# Patient Record
Sex: Female | Born: 1985 | Race: White | Hispanic: No | Marital: Single | Smoking: Current every day smoker
Health system: Southern US, Community
[De-identification: ages and names within clinical notes are randomized; demographics above are authoritative.]

## PROBLEM LIST (undated history)

## (undated) ENCOUNTER — Inpatient Hospital Stay (HOSPITAL_COMMUNITY): Payer: Self-pay

## (undated) DIAGNOSIS — F319 Bipolar disorder, unspecified: Secondary | ICD-10-CM

## (undated) DIAGNOSIS — F329 Major depressive disorder, single episode, unspecified: Secondary | ICD-10-CM

## (undated) DIAGNOSIS — B009 Herpesviral infection, unspecified: Secondary | ICD-10-CM

## (undated) DIAGNOSIS — F32A Depression, unspecified: Secondary | ICD-10-CM

## (undated) DIAGNOSIS — F1910 Other psychoactive substance abuse, uncomplicated: Secondary | ICD-10-CM

## (undated) DIAGNOSIS — F419 Anxiety disorder, unspecified: Secondary | ICD-10-CM

## (undated) HISTORY — PX: WISDOM TOOTH EXTRACTION: SHX21

## (undated) HISTORY — PX: NO PAST SURGERIES: SHX2092

---

## 2017-10-20 ENCOUNTER — Inpatient Hospital Stay (HOSPITAL_BASED_OUTPATIENT_CLINIC_OR_DEPARTMENT_OTHER)
Admission: EM | Admit: 2017-10-20 | Discharge: 2017-10-20 | Disposition: A | Discharge status: Home / Self Care | Payer: Medicaid Other | Attending: Obstetrics & Gynecology | Admitting: Obstetrics & Gynecology

## 2017-10-20 ENCOUNTER — Other Ambulatory Visit: Payer: Self-pay

## 2017-10-20 ENCOUNTER — Encounter (HOSPITAL_BASED_OUTPATIENT_CLINIC_OR_DEPARTMENT_OTHER): Payer: Self-pay | Admitting: *Deleted

## 2017-10-20 DIAGNOSIS — F17210 Nicotine dependence, cigarettes, uncomplicated: Secondary | ICD-10-CM | POA: Diagnosis not present

## 2017-10-20 DIAGNOSIS — O9989 Other specified diseases and conditions complicating pregnancy, childbirth and the puerperium: Secondary | ICD-10-CM

## 2017-10-20 DIAGNOSIS — Z3A26 26 weeks gestation of pregnancy: Secondary | ICD-10-CM | POA: Diagnosis not present

## 2017-10-20 DIAGNOSIS — F1123 Opioid dependence with withdrawal: Secondary | ICD-10-CM | POA: Diagnosis not present

## 2017-10-20 DIAGNOSIS — O26892 Other specified pregnancy related conditions, second trimester: Secondary | ICD-10-CM | POA: Diagnosis present

## 2017-10-20 DIAGNOSIS — F1193 Opioid use, unspecified with withdrawal: Secondary | ICD-10-CM

## 2017-10-20 DIAGNOSIS — F1910 Other psychoactive substance abuse, uncomplicated: Secondary | ICD-10-CM | POA: Insufficient documentation

## 2017-10-20 DIAGNOSIS — O99322 Drug use complicating pregnancy, second trimester: Secondary | ICD-10-CM | POA: Diagnosis not present

## 2017-10-20 DIAGNOSIS — O99332 Smoking (tobacco) complicating pregnancy, second trimester: Secondary | ICD-10-CM | POA: Diagnosis not present

## 2017-10-20 HISTORY — DX: Bipolar disorder, unspecified: F31.9

## 2017-10-20 HISTORY — DX: Other psychoactive substance abuse, uncomplicated: F19.10

## 2017-10-20 HISTORY — DX: Anxiety disorder, unspecified: F41.9

## 2017-10-20 HISTORY — DX: Major depressive disorder, single episode, unspecified: F32.9

## 2017-10-20 HISTORY — DX: Depression, unspecified: F32.A

## 2017-10-20 LAB — CBC WITH DIFFERENTIAL/PLATELET
ABS IMMATURE GRANULOCYTES: 0.06 10*3/uL (ref 0.00–0.07)
Basophils Absolute: 0 10*3/uL (ref 0.0–0.1)
Basophils Relative: 0 %
Eosinophils Absolute: 0.1 10*3/uL (ref 0.0–0.5)
Eosinophils Relative: 1 %
HEMATOCRIT: 37.8 % (ref 36.0–46.0)
HEMOGLOBIN: 12.9 g/dL (ref 12.0–15.0)
IMMATURE GRANULOCYTES: 1 %
LYMPHS PCT: 10 %
Lymphs Abs: 1 10*3/uL (ref 0.7–4.0)
MCH: 31.8 pg (ref 26.0–34.0)
MCHC: 34.1 g/dL (ref 30.0–36.0)
MCV: 93.1 fL (ref 80.0–100.0)
MONO ABS: 0.5 10*3/uL (ref 0.1–1.0)
MONOS PCT: 5 %
NEUTROS ABS: 8.5 10*3/uL — AB (ref 1.7–7.7)
NEUTROS PCT: 83 %
PLATELETS: 199 10*3/uL (ref 150–400)
RBC: 4.06 MIL/uL (ref 3.87–5.11)
RDW: 12.9 % (ref 11.5–15.5)
WBC: 10.1 10*3/uL (ref 4.0–10.5)
nRBC: 0 % (ref 0.0–0.2)

## 2017-10-20 LAB — BASIC METABOLIC PANEL
Anion gap: 8 (ref 5–15)
BUN: 8 mg/dL (ref 6–20)
CALCIUM: 8.5 mg/dL — AB (ref 8.9–10.3)
CO2: 23 mmol/L (ref 22–32)
CREATININE: 0.62 mg/dL (ref 0.44–1.00)
Chloride: 102 mmol/L (ref 98–111)
GFR calc Af Amer: >60 mL/min (ref >60)
GFR calc non Af Amer: >60 mL/min (ref >60)
Glucose, Bld: 90 mg/dL (ref 70–99)
Potassium: 3.7 mmol/L (ref 3.5–5.1)
Sodium: 133 mmol/L — ABNORMAL LOW (ref 135–145)

## 2017-10-20 LAB — WET PREP, GENITAL
Sperm: NONE SEEN
Trich, Wet Prep: NONE SEEN
Yeast Wet Prep HPF POC: NONE SEEN

## 2017-10-20 LAB — ABO/RH: ABO/RH(D): B POS

## 2017-10-20 LAB — RAPID URINE DRUG SCREEN, HOSP PERFORMED
Amphetamines: NOT DETECTED
BARBITURATES: NOT DETECTED
Benzodiazepines: NOT DETECTED
Cocaine: NOT DETECTED
OPIATES: NOT DETECTED
Tetrahydrocannabinol: POSITIVE — AB

## 2017-10-20 LAB — URINALYSIS, ROUTINE W REFLEX MICROSCOPIC
BILIRUBIN URINE: NEGATIVE
Glucose, UA: NEGATIVE mg/dL
Hgb urine dipstick: NEGATIVE
KETONES UR: NEGATIVE mg/dL
Leukocytes, UA: NEGATIVE
NITRITE: POSITIVE — AB
PH: 8 (ref 5.0–8.0)
Protein, ur: NEGATIVE mg/dL
Specific Gravity, Urine: 1.015 (ref 1.005–1.030)

## 2017-10-20 LAB — URINALYSIS, MICROSCOPIC (REFLEX)

## 2017-10-20 LAB — PREGNANCY, URINE: Preg Test, Ur: POSITIVE — AB

## 2017-10-20 MED ORDER — BUPRENORPHINE HCL-NALOXONE HCL 8-2 MG SL FILM: 1.0000 | ORAL_FILM | Freq: Every day | SUBLINGUAL | 0 refills | Status: DC

## 2017-10-20 MED ORDER — SODIUM CHLORIDE 0.9 % IV BOLUS
500.0000 mL | Freq: Once | INTRAVENOUS | Status: AC
  Administered 2017-10-20: 500 mL via INTRAVENOUS

## 2017-10-20 MED ORDER — METOCLOPRAMIDE HCL 5 MG/ML IJ SOLN
10.0000 mg | Freq: Once | INTRAMUSCULAR | Status: AC
  Administered 2017-10-20: 10 mg via INTRAVENOUS
  Filled 2017-10-20: qty 2

## 2017-10-20 MED ORDER — SODIUM CHLORIDE 0.9 % IV SOLN: INTRAVENOUS | Status: DC

## 2017-10-20 MED ORDER — SODIUM CHLORIDE 0.9 % IV BOLUS: 500.0000 mL | Freq: Once | INTRAVENOUS | Status: DC

## 2017-10-20 MED ORDER — BUPRENORPHINE HCL-NALOXONE HCL 2-0.5 MG SL SUBL: 2.0000 | SUBLINGUAL_TABLET | Freq: Every day | SUBLINGUAL | Status: DC

## 2017-10-20 MED ORDER — BUPRENORPHINE HCL 2 MG SL SUBL
2.0000 mg | SUBLINGUAL_TABLET | Freq: Once | SUBLINGUAL | Status: AC
  Administered 2017-10-20: 2 mg via SUBLINGUAL
  Filled 2017-10-20: qty 1

## 2017-10-20 NOTE — ED Triage Notes (Signed)
Pt amb to triage with quick steady gait in nad. Pt reports she is having withdraws, n/v and anxiety, from crack cocaine, meth, percocet and adderall, last use last Thursday. States she is on suboxone and last had it 2 days ago. Pt states she thinks she is 5 months pregnant, has not had prenatal care. Denies any bleeding, discharge, cramping or other pregnancy related complaint at this time. States she is feeling the baby move regularly. Per pt mother who brought her here, her other 2 children were born prematurely and addicted, "they had to spend weeks in the NICU".

## 2017-10-20 NOTE — ED Provider Notes (Signed)
[facility] EMERGENCY DEPARTMENT Provider Note   CSN: [id number] Arrival date & time: 10/20/17  0950     History   Chief Complaint Chief Complaint  Patient presents with  . Emesis    HPI Marcia West is a 32 y.o. female.  Patient presents with complaint of nausea, vomiting, diarrhea, muscle aches, cold sweats after stopping multiple substances recently.  Known pregnancy of approximately [redacted] weeks gestation.  Patient states that she stopped taking Xanax and opioids 1 to 2 days ago.  She has also been prescribed Suboxone which she stopped taking.  She reports additional use of crack cocaine and methamphetamine.  Last use of this was 1-1/2 to 2 weeks ago.  Patient is here with her mother requesting assistance for her symptoms as well as help establishing prenatal care and help with her substance abuse problems.  Patient denies any abdominal pain, vaginal bleeding.  No chest pain or shortness of breath.  No fevers.  She has been eating and drinking.  The onset of this condition was acute. The course is constant. Aggravating factors: none. Alleviating factors: none.   06/20/17: Single live intrauterine gestation with sonographic gestational age of [redacted] weeks and 4 days (performed in [place]). She had pan-sensitive E. Coli UTI at that time.       Past Medical History:  Diagnosis Date  . Anxiety   . Bipolar affective (HCC)   . Depression   . Substance abuse (HCC)     There are no active problems to display for this patient.   History reviewed. No pertinent surgical history.   OB History    Gravida  4   Para  3   Term      Preterm  3   AB      Living        SAB      TAB      Ectopic      Multiple      Live Births               Home Medications    Prior to Admission medications   Medication Sig Start Date End Date Taking? Authorizing Provider  alprazolam Prudy Feeler) 2 MG tablet Take 2 mg by mouth at bedtime as needed for sleep.   Yes [provider]  buprenorphine-naloxone (SUBOXONE) 2-0.5 mg SUBL SL tablet Place 1 tablet under the tongue daily.   Yes [provider]  buPROPion (WELLBUTRIN XL) 300 MG 24 hr tablet Take 300 mg by mouth daily.   Yes [provider]  Mirtazapine (REMERON PO) Take by mouth.   Yes [provider]    Family History History reviewed. No pertinent family history.  Social History Social History   Tobacco Use  . Smoking status: Current Every Day Smoker  . Smokeless tobacco: Never Used  Substance Use Topics  . Alcohol use: Never    Frequency: Never  . Drug use: Yes    Types: Marijuana, Cocaine, Methamphetamines    Comment: percocet adderall, last use thursday     Allergies   Patient has no known allergies.   Review of Systems Review of Systems  Constitutional: Positive for diaphoresis. Negative for fever.  HENT: Negative for rhinorrhea and sore throat.   Eyes: Negative for redness.  Respiratory: Negative for cough.   Cardiovascular: Negative for chest pain.  Gastrointestinal: Positive for diarrhea, nausea and vomiting. Negative for abdominal pain.  Genitourinary: Negative for dysuria, hematuria, vaginal bleeding and vaginal discharge.  Musculoskeletal: Positive for myalgias.  Skin: Negative for rash.  Neurological: Negative for headaches.     Physical Exam Updated Vital Signs BP 114/83 (BP Location: Left Arm)   Pulse 83   Temp 98.2 F (36.8 C) (Oral)   Resp 16   Ht 5\' 7"  (1.702 m)   Wt 70.3 kg   LMP 03/29/2017   SpO2 100%   BMI 24.28 kg/m   Physical Exam  Constitutional: She appears well-developed and well-nourished.  HENT:  Head: Normocephalic and atraumatic.  Mouth/Throat: Oropharynx is clear and moist.  Eyes: Conjunctivae are normal. Right eye exhibits no discharge. Left eye exhibits no discharge.  Neck: Normal range of motion. Neck supple.  Cardiovascular: Normal rate, regular rhythm and normal heart sounds.  Pulmonary/Chest:  Effort normal and breath sounds normal. No respiratory distress.  Abdominal: Soft. There is no tenderness. There is no rebound and no guarding.  Gravid uterus.  Uterus extends up to approximately the umbilicus.  Neurological: She is alert.  Skin: Skin is warm and dry.  Psychiatric: She has a normal mood and affect.  Nursing note and vitals reviewed.    ED Treatments / Results  Labs (all labs ordered are listed, but only abnormal results are displayed) Labs Reviewed  CBC WITH DIFFERENTIAL/PLATELET - Abnormal; Notable for the following components:      Result Value   Neutro Abs 8.5 (*)    All other components within normal limits  BASIC METABOLIC PANEL - Abnormal; Notable for the following components:   Sodium 133 (*)    Calcium 8.5 (*)    All other components within normal limits  URINALYSIS, ROUTINE W REFLEX MICROSCOPIC - Abnormal; Notable for the following components:   Nitrite POSITIVE (*)    All other components within normal limits  RAPID URINE DRUG SCREEN, HOSP PERFORMED - Abnormal; Notable for the following components:   Tetrahydrocannabinol POSITIVE (*)    All other components within normal limits  PREGNANCY, URINE - Abnormal; Notable for the following components:   Preg Test, Ur POSITIVE (*)    All other components within normal limits  URINALYSIS, MICROSCOPIC (REFLEX) - Abnormal; Notable for the following components:   Bacteria, UA MANY (*)    All other components within normal limits  URINE CULTURE  HEPATITIS B SURFACE ANTIGEN  RUBELLA SCREEN  RPR  HIV ANTIBODY (ROUTINE TESTING W REFLEX)    EKG None  Radiology No results found.  Procedures Procedures (including critical care time)  Medications Ordered in ED Medications  sodium chloride 0.9 % bolus 500 mL ( Intravenous Restarted 10/20/17 1122)  metoCLOPramide (REGLAN) injection 10 mg (10 mg Intravenous Given 10/20/17 1125)     Initial Impression / Assessment and Plan / ED Course  I have reviewed the  triage vital signs and the nursing notes.  Pertinent labs & imaging results that were available during my care of the patient were reviewed by me and considered in my medical decision making (see chart for details).     Patient seen and examined.  Bedside echo demonstrates fetal heart rate of 143-150.  Good apparent fetal movement.  Now on monitoring.  Vital signs reviewed and are as follows: BP 114/83 (BP Location: Left Arm)   Pulse 83   Temp 98.2 F (36.8 C) (Oral)   Resp 16   Ht 5\' 7"  (1.702 m)   Wt 70.3 kg   LMP 03/29/2017   SpO2 100%   BMI 24.28 kg/m   We will treat patient's symptoms with fluids and  antiemetics.  Will check lab work, UA, UDS.   11:41 AM Spoke with Dr. Adrian Blackwater. Will transfer patient up to MAU for further evaluation.   Also spoke with rapid OB, Erin, who states patient is having some contractions. Requests additional fluids, ordered.    Final Clinical Impressions(s) / ED Diagnoses   Final diagnoses:  [redacted] weeks gestation of pregnancy  Opioid withdrawal (HCC)  Polysubstance abuse (HCC)   Transfer to MAU.   ED Discharge Orders    None       Renne Crigler, Cordelia Poche 10/20/17 1148    Alvira Monday, MD 10/21/17 2057

## 2017-10-20 NOTE — ED Notes (Signed)
[facility] staff at bedside, report given. Pt and her mother aware of plan to transfer pt to MAU for further eval.

## 2017-10-20 NOTE — MAU Note (Signed)
Pt presents to MAU with c/o nausea, vomiting that started yesterday and diarrhea that started x 2 days ago. Pt has had no PNC, was seen in May in [place] and was given due date of 01/21/2018. Pt denies VB and LOF. +FM

## 2017-10-20 NOTE — Discharge Instructions (Signed)
Opioid Use Disorder Opioids are powerful substances that relieve pain. Opioids include illegal drugs, such as heroin, as well as prescription pain medicines, such as codeine, morphine, hydrocodone, oxycodone, and fentanyl. Opioid use disorder is when you take opioids for nonmedical reasons even though taking them hurts your health and well-being. Taking prescribed opioids regularly can lead to dependence, especially if you take them in larger amounts or more often than they should be taken. Opioid use disorder often disrupts life at home, work, or school. It can cause mental and physical problems. It also increases your risk of suicide and death from overdose. What are the causes? This condition is caused by taking opioids. Taking opioids repeatedly results in changes in the brain that make it hard to control opioid use. Many people develop this condition because they like the way they feel when they take opioids or because they get addicted to them. What increases the risk? This condition is more likely to develop in:  People with a family history of opioid use disorder.  People who misuse other drugs.  People with a mental illness, such as depression, post-traumatic stress disorder, or antisocial personality disorder.  People who begin use at an early age, such as during their teen years.  What are the signs or symptoms? Symptoms of this condition include:  Taking greater amounts of an opioid than you want to or for longer than you want to.  Trying several times to control your opioid use.  Spending a lot of time getting opioids, using them, or recovering from their effects.  Craving opioids.  Having problems at work, at school, at home, or in a relationship because of opioid use.  Giving up or cutting down on important life activities because of opioid use.  Using opioids when it is dangerous, such as when driving a car.  Continuing to use an opioid even though it has led to a  physical problem, such as: ? Severe constipation. ? Poor nutrition. ? Infertility. ? Tuberculosis. ? Aspiration pneumonia. ? An infection, such as hepatitis or HIV (human immunodeficiency virus).  Continuing to use an opioid even though it is causing a mental problem, such as: ? Depression or anxiety. ? Hallucinations. ? Sleep problems. ? Loss of interest in sex.  Needing more and more of an opioid to get the same effect that you want from the opioid (building up a tolerance).  Having symptoms of withdrawal when you stop using an opioid. Some symptoms of withdrawal are: ? Depression or anxiety. ? Irritability. ? Nausea or vomiting. ? Muscle aches or spasms. ? Watery eyes. ? Trouble sleeping. ? Yawning.  How is this diagnosed? This condition is diagnosed with an assessment. During the assessment, your health care provider will ask about your opioid use and how it affects your life. Your health care provider may perform a physical exam or do lab tests to see if you have physical problems resulting from opioid use. Your health care provider may also screen for drug use and refer you to a mental health professional for evaluation. How is this treated? Treatment for this condition is usually provided by mental health professionals with training in substance use disorders. The first step in treatment is detoxification, which involves taking medicines to lessen withdrawal symptoms. Additional treatment may involve:  Counseling. This treatment is also called talk therapy. It is provided by substance use treatment counselors. A counselor can address the reasons you use opioids and suggest ways to keep you from using opioids   again. The goals of talk therapy are to: ? Find healthy activities and ways to cope with stress. ? Identify and avoid what triggers your opioid use. ? Help you learn how to handle cravings.  Support groups. Support groups are run by people who have quit using opioids.  They provide emotional support, advice, and guidance.  A medicine that blocks opioid receptors in your brain. This medicine can reduce opioid cravings that lead to relapse. This medicine also blocks the good feeling that you get from using opioids.  Opioid maintenance treatment. This involves taking certain kinds of opioid medicines. These medicines satisfy cravings but are safer than commonly misused opioids. This is often the best option for people who continue to relapse with other treatments.  Follow these instructions at home:  Take over-the-counter and prescription medicines only as told by your health care provider.  Check with your health care provider before starting any new medicines.  Keep all follow-up visits as told by your health care provider. This is important. Where to find more information:  National Institute on Drug Abuse: www.drugabuse.gov  Substance Abuse and Mental Health Services Administration: www.samhsa.gov Contact a health care provider if:  You are not able to take your medicines as told.  Your symptoms get worse. Get help right away if:  You may have taken too much of an opioid (overdosed).  You have serious thoughts about hurting yourself or others. If you ever feel like you may hurt yourself or others, or have thoughts about taking your own life, get help right away. You can go to your nearest emergency department or call:  Your local emergency services (911 in the [place]  A suicide crisis helpline, such as the National Suicide Prevention Lifeline at 1-800-273-8255. This is open 24 hours a day.  This information is not intended to replace advice given to you by your health care provider. Make sure you discuss any questions you have with your health care provider. Document Released: 10/27/2006 Document Revised: 10/12/2015 Document Reviewed: 10/12/2015 Elsevier Interactive Patient Education  2018 Elsevier Inc.  
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
## 2017-10-20 NOTE — ED Notes (Signed)
Josh, PA at bedside, unable to find fht, [place] PA using bedside US.

## 2017-10-21 LAB — RPR: RPR: NONREACTIVE

## 2017-10-21 LAB — GC/CHLAMYDIA PROBE AMP (~~LOC~~) NOT AT ARMC
Chlamydia: NEGATIVE
Neisseria Gonorrhea: NEGATIVE

## 2017-10-21 LAB — HIV ANTIBODY (ROUTINE TESTING W REFLEX): HIV SCREEN 4TH GENERATION: NONREACTIVE

## 2017-10-21 LAB — RUBELLA SCREEN: RUBELLA: 5.11 {index} (ref >0.99)

## 2017-10-21 LAB — HEPATITIS B SURFACE ANTIGEN: HEP B S AG: NEGATIVE

## 2017-10-22 ENCOUNTER — Ambulatory Visit (INDEPENDENT_AMBULATORY_CARE_PROVIDER_SITE_OTHER): Payer: Self-pay | Admitting: Family Medicine

## 2017-10-22 ENCOUNTER — Encounter: Payer: Self-pay | Admitting: Family Medicine

## 2017-10-22 VITALS — BP 105/70 | HR 72 | Wt 153.0 lb

## 2017-10-22 DIAGNOSIS — B009 Herpesviral infection, unspecified: Secondary | ICD-10-CM

## 2017-10-22 DIAGNOSIS — O98513 Other viral diseases complicating pregnancy, third trimester: Secondary | ICD-10-CM

## 2017-10-22 DIAGNOSIS — O98512 Other viral diseases complicating pregnancy, second trimester: Secondary | ICD-10-CM

## 2017-10-22 DIAGNOSIS — Z1151 Encounter for screening for human papillomavirus (HPV): Secondary | ICD-10-CM

## 2017-10-22 DIAGNOSIS — O98519 Other viral diseases complicating pregnancy, unspecified trimester: Secondary | ICD-10-CM | POA: Insufficient documentation

## 2017-10-22 DIAGNOSIS — O99322 Drug use complicating pregnancy, second trimester: Secondary | ICD-10-CM

## 2017-10-22 DIAGNOSIS — Z113 Encounter for screening for infections with a predominantly sexual mode of transmission: Secondary | ICD-10-CM

## 2017-10-22 DIAGNOSIS — F1190 Opioid use, unspecified, uncomplicated: Secondary | ICD-10-CM | POA: Insufficient documentation

## 2017-10-22 DIAGNOSIS — F1199 Opioid use, unspecified with unspecified opioid-induced disorder: Secondary | ICD-10-CM

## 2017-10-22 DIAGNOSIS — O09899 Supervision of other high risk pregnancies, unspecified trimester: Secondary | ICD-10-CM | POA: Insufficient documentation

## 2017-10-22 DIAGNOSIS — O09892 Supervision of other high risk pregnancies, second trimester: Secondary | ICD-10-CM

## 2017-10-22 DIAGNOSIS — Z124 Encounter for screening for malignant neoplasm of cervix: Secondary | ICD-10-CM

## 2017-10-22 LAB — POCT URINALYSIS DIPSTICK OB
Bilirubin, UA: NEGATIVE
Blood, UA: NEGATIVE
Glucose, UA: NEGATIVE
Ketones, UA: NEGATIVE
Leukocytes, UA: NEGATIVE
NITRITE UA: NEGATIVE
PROTEIN: NEGATIVE

## 2017-10-22 LAB — URINE CULTURE: Culture: >=100000 — AB

## 2017-10-22 MED ORDER — BUPRENORPHINE HCL-NALOXONE HCL 8-2 MG SL FILM: 1.0000 | ORAL_FILM | Freq: Every day | SUBLINGUAL | 0 refills | Status: DC

## 2017-10-22 MED ORDER — VALACYCLOVIR HCL 500 MG PO TABS: 1000.0000 mg | ORAL_TABLET | Freq: Two times a day (BID) | ORAL | 0 refills | Status: DC

## 2017-10-22 NOTE — Progress Notes (Signed)
Subjective:  Marcia West is a [id number] [redacted]w[redacted]d being seen today for her first obstetrical visit.  Her obstetrical history is significant for polysubstance abuse, opioid use disorder, history of preterm delivery. Was previously living in [place] with FOB, who also uses opioids. She left him and her two children there. She was seen in [facility]U for withdrawal symptoms and started on Suboxone 8mg  daily. She reports no withdrawal symptoms. She was seen today with her mother. Patient does intend to breast feed. Pregnancy history fully reviewed.   Patient reports no complaints.  BP 105/70   Pulse 72   Wt 153 lb 0.6 oz (69.4 kg)   LMP 03/29/2017   BMI 23.97 kg/m   HISTORY: OB History  Gravida Para Term Preterm AB Living  3 2 1 1   2   SAB TAB Ectopic Multiple Live Births          2    # Outcome Date GA Lbr Len/2nd Weight Sex Delivery Anes PTL Lv  3 Current           2 Term 2013 [redacted]w[redacted]d   M Vag-Spont EPI  LIV  1 Preterm 2011 [redacted]w[redacted]d   M Vag-Spont EPI  LIV    Past Medical History:  Diagnosis Date  . Anxiety   . Bipolar affective (HCC)   . Depression   . Substance abuse (HCC)     No past surgical history on file.  No family history on file.   Exam  BP 105/70   Pulse 72   Wt 153 lb 0.6 oz (69.4 kg)   LMP 03/29/2017   BMI 23.97 kg/m   CONSTITUTIONAL: Well-developed, well-nourished female in no acute distress.  HENT:  Normocephalic, atraumatic, External right and left ear normal. Oropharynx is clear and moist EYES: Conjunctivae and EOM are normal. Pupils are equal, round, and reactive to light. No scleral icterus.  NECK: Normal range of motion, supple, no masses.  Normal thyroid.  CARDIOVASCULAR: Normal heart rate noted, regular rhythm RESPIRATORY: Clear to auscultation bilaterally. Effort and breath sounds normal, no problems with respiration noted. BREASTS: Symmetric in size. No masses, skin changes, nipple drainage, or lymphadenopathy. ABDOMEN: Soft, normal bowel sounds, no  distention noted.  No tenderness, rebound or guarding.  PELVIC: Normal appearing external genitalia; normal appearing vaginal mucosa and cervix. No abnormal discharge noted. Normal uterine size, no other palpable masses, no uterine or adnexal tenderness. Cervix: 0.5, thick, firm. Grouped Vesicular lesions on Left buttocks. MUSCULOSKELETAL: Normal range of motion. No tenderness.  No cyanosis, clubbing, or edema.  2+ distal pulses. SKIN: Skin is warm and dry. No rash noted. Not diaphoretic. No erythema. No pallor. NEUROLOGIC: Alert and oriented to person, place, and time. Normal reflexes, muscle tone coordination. No cranial nerve deficit noted. PSYCHIATRIC: Normal mood and affect. Normal behavior. Normal judgment and thought content.    Assessment:    Pregnancy: [id number] Patient Active Problem List   Diagnosis Date Noted  . Supervision of other high risk pregnancies, unspecified trimester 10/22/2017  . Opioid use disorder (HCC) 10/22/2017  . 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 Supervision of other high risk pregnancies, unspecified trimester Would like to do [place]. Applying for pregnancy medicaid. Thinking about POPs for contraception.  - [place] MFM OB DETAIL +14 WK; Future - ToxASSURE Select 13 (MW), Urine - Cytology - PAP - Herpes simplex virus culture  2. Opioid use disorder (HCC) Continue Suboxone 8mg  daily  3. Herpes virus infection in mother  during third trimester of pregnancy Will cover for herpes now. Culture sent.   Problem list reviewed and updated. 75% of 45 min visit spent on counseling and coordination of care.     Levie Heritage 10/22/2017

## 2017-10-22 NOTE — Addendum Note (Signed)
Addended by: Lorelle Gibbs L on: 10/22/2017 02:29 PM   Modules accepted: Orders

## 2017-10-22 NOTE — Progress Notes (Signed)
DATING AND VIABILITY SONOGRAM   Marcia West is a 32 y.o. year old G45P1102 with LMP Patient's last menstrual period was 03/29/2017. which would correlate to  [redacted]w[redacted]d weeks gestation.  She has regular menstrual cycles.   She is here today for a confirmatory initial sonogram.    GESTATION: SINGLETON     FETAL ACTIVITY:          Heart rate         141          The fetus is active  ADNEXA: The ovaries are normal.   GESTATIONAL AGE AND  BIOMETRICS:  Gestational criteria: Estimated Date of Delivery: 01/20/18 by LMP now at [redacted]w[redacted]d  Previous Scans:0      Abdominal circum. 21.7 cm 26.1 weeks                                                                               AVERAGE EGA(BY THIS SCAN):  26.1 weeks  WORKING EDD( LMP ):  27.1 weekd     Armandina Stammer 10/22/2017 9:15 AM

## 2017-10-25 LAB — URINE CULTURE, OB REFLEX

## 2017-10-25 LAB — HERPES SIMPLEX VIRUS CULTURE

## 2017-10-25 LAB — CULTURE, OB URINE

## 2017-10-26 ENCOUNTER — Telehealth: Payer: Self-pay

## 2017-10-26 NOTE — Telephone Encounter (Signed)
Called patient - no DPR on file - LMOVMTC re email request. The request if for depression medication - per provider patient needs to obtain medication from her PCP.

## 2017-10-27 ENCOUNTER — Other Ambulatory Visit (HOSPITAL_COMMUNITY): Payer: Self-pay | Admitting: *Deleted

## 2017-10-27 ENCOUNTER — Encounter (HOSPITAL_COMMUNITY): Payer: Self-pay

## 2017-10-27 ENCOUNTER — Ambulatory Visit (HOSPITAL_COMMUNITY)
Admission: RE | Admit: 2017-10-27 | Discharge: 2017-10-27 | Disposition: A | Discharge status: Home / Self Care | Payer: Medicaid Other | Source: Ambulatory Visit | Attending: Family Medicine | Admitting: Family Medicine

## 2017-10-27 DIAGNOSIS — Z363 Encounter for antenatal screening for malformations: Secondary | ICD-10-CM | POA: Insufficient documentation

## 2017-10-27 DIAGNOSIS — O09892 Supervision of other high risk pregnancies, second trimester: Secondary | ICD-10-CM

## 2017-10-27 DIAGNOSIS — Z3A27 27 weeks gestation of pregnancy: Secondary | ICD-10-CM | POA: Diagnosis not present

## 2017-10-27 DIAGNOSIS — F1910 Other psychoactive substance abuse, uncomplicated: Secondary | ICD-10-CM | POA: Diagnosis not present

## 2017-10-27 DIAGNOSIS — O99322 Drug use complicating pregnancy, second trimester: Secondary | ICD-10-CM | POA: Diagnosis not present

## 2017-10-27 DIAGNOSIS — Z362 Encounter for other antenatal screening follow-up: Secondary | ICD-10-CM

## 2017-10-27 DIAGNOSIS — O09899 Supervision of other high risk pregnancies, unspecified trimester: Secondary | ICD-10-CM

## 2017-10-27 DIAGNOSIS — O0932 Supervision of pregnancy with insufficient antenatal care, second trimester: Secondary | ICD-10-CM | POA: Diagnosis not present

## 2017-10-27 HISTORY — DX: Herpesviral infection, unspecified: B00.9

## 2017-10-28 ENCOUNTER — Other Ambulatory Visit: Payer: Self-pay | Admitting: Family Medicine

## 2017-10-28 LAB — TOXASSURE SELECT 13 (MW), URINE

## 2017-10-28 LAB — CYTOLOGY - PAP
Adequacy: ABSENT
Chlamydia: NEGATIVE
Diagnosis: NEGATIVE
Diagnosis: REACTIVE
HPV (WINDOPATH): NOT DETECTED
NEISSERIA GONORRHEA: NEGATIVE

## 2017-10-28 MED ORDER — NITROFURANTOIN MONOHYD MACRO 100 MG PO CAPS: 100.0000 mg | ORAL_CAPSULE | Freq: Two times a day (BID) | ORAL | 1 refills | Status: DC

## 2017-11-05 ENCOUNTER — Encounter: Payer: Self-pay | Admitting: Family Medicine

## 2017-11-05 ENCOUNTER — Ambulatory Visit (INDEPENDENT_AMBULATORY_CARE_PROVIDER_SITE_OTHER): Payer: PRIVATE HEALTH INSURANCE | Admitting: Family Medicine

## 2017-11-05 VITALS — BP 111/71 | HR 80 | Wt 158.0 lb

## 2017-11-05 DIAGNOSIS — F419 Anxiety disorder, unspecified: Secondary | ICD-10-CM

## 2017-11-05 DIAGNOSIS — F1199 Opioid use, unspecified with unspecified opioid-induced disorder: Secondary | ICD-10-CM

## 2017-11-05 DIAGNOSIS — F329 Major depressive disorder, single episode, unspecified: Secondary | ICD-10-CM

## 2017-11-05 DIAGNOSIS — O09899 Supervision of other high risk pregnancies, unspecified trimester: Secondary | ICD-10-CM

## 2017-11-05 DIAGNOSIS — Z23 Encounter for immunization: Secondary | ICD-10-CM

## 2017-11-05 DIAGNOSIS — B009 Herpesviral infection, unspecified: Secondary | ICD-10-CM

## 2017-11-05 DIAGNOSIS — O09893 Supervision of other high risk pregnancies, third trimester: Secondary | ICD-10-CM

## 2017-11-05 DIAGNOSIS — O98513 Other viral diseases complicating pregnancy, third trimester: Secondary | ICD-10-CM

## 2017-11-05 DIAGNOSIS — F1190 Opioid use, unspecified, uncomplicated: Secondary | ICD-10-CM

## 2017-11-05 MED ORDER — FLUOXETINE HCL 20 MG PO CAPS: 20.0000 mg | ORAL_CAPSULE | Freq: Every day | ORAL | 3 refills | Status: DC

## 2017-11-05 MED ORDER — VALACYCLOVIR HCL 500 MG PO TABS: 500.0000 mg | ORAL_TABLET | Freq: Two times a day (BID) | ORAL | 2 refills | Status: DC

## 2017-11-05 MED ORDER — BUPRENORPHINE HCL-NALOXONE HCL 8-2 MG SL FILM: 1.0000 | ORAL_FILM | Freq: Every day | SUBLINGUAL | 0 refills | Status: DC

## 2017-11-05 NOTE — Progress Notes (Signed)
   PRENATAL VISIT NOTE  Subjective:  Marcia West is a 32 y.o. G3P1102 at [redacted]w[redacted]d being seen today for ongoing prenatal care.  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  Patient reports depression and anxiety. Used to be on wellbutrin and xanax. Has also used celexa and buspar..  Contractions: Not present. Vag. Bleeding: None.  Movement: Present. Denies leaking of fluid.   The following portions of the patient's history were reviewed and updated as appropriate: allergies, current medications, past family history, past medical history, past social history, past surgical history and problem list. Problem list updated.  Objective:   Vitals:   11/05/17 0855  BP: 111/71  Pulse: 80  Weight: 158 lb (71.7 kg)    Fetal Status: Fetal Heart Rate (bpm): 135   Movement: Present     General:  Alert, oriented and cooperative. Patient is in no acute distress.  Skin: Skin is warm and dry. No rash noted.   Cardiovascular: Normal heart rate noted  Respiratory: Normal respiratory effort, no problems with respiration noted  Abdomen: Soft, gravid, appropriate for gestational age.  Pain/Pressure: Absent     Pelvic: Cervical exam deferred        Extremities: Normal range of motion.  Edema: None  Mental Status: Normal mood and affect. Normal behavior. Normal judgment and thought content.   Assessment and Plan:  Pregnancy: G3P1102 at [redacted]w[redacted]d  1. Supervision of other high risk pregnancies, unspecified trimester FHT and FH normal - Glucose Tolerance, 2 Hours w/1 Hour - CBC - HIV antibody (with reflex) - RPR  2. Opioid use disorder (HCC) No withdrawal symptoms, drug cravings. Feels like she is on a good dose. No deviance.   3. Herpes virus infection in mother during third trimester of  pregnancy Area has completely healed. Will place on prophylaxis to prevent recurrance.  4. Anxiety and depression Start prozac. Add buspar if needed.  Preterm labor symptoms and general obstetric precautions including but not limited to vaginal bleeding, contractions, leaking of fluid and fetal movement were reviewed in detail with the patient. Please refer to After Visit Summary for other counseling recommendations.  Return in about 2 weeks (around 11/19/2017) for OB f/u.  Future Appointments  Date Time Provider Department Center  11/19/2017  2:00 PM Levie Heritage, DO [facility] None  11/24/2017 12:30 PM [facility] [place] 1 [facility]    Levie Heritage, DO

## 2017-11-06 LAB — CBC
HEMATOCRIT: 35.9 % (ref 34.0–46.6)
Hemoglobin: 12.2 g/dL (ref 11.1–15.9)
MCH: 31.4 pg (ref 26.6–33.0)
MCHC: 34 g/dL (ref 31.5–35.7)
MCV: 92 fL (ref 79–97)
Platelets: 217 10*3/uL (ref 150–450)
RBC: 3.89 x10E6/uL (ref 3.77–5.28)
RDW: 13 % (ref 12.3–15.4)
WBC: 9.4 10*3/uL (ref 3.4–10.8)

## 2017-11-06 LAB — HIV ANTIBODY (ROUTINE TESTING W REFLEX): HIV Screen 4th Generation wRfx: NONREACTIVE

## 2017-11-06 LAB — GLUCOSE TOLERANCE, 2 HOURS W/ 1HR
Glucose, 1 hour: 137 mg/dL (ref 65–179)
Glucose, 2 hour: 114 mg/dL (ref 65–152)
Glucose, Fasting: 76 mg/dL (ref 65–91)

## 2017-11-06 LAB — RPR: RPR Ser Ql: NONREACTIVE

## 2017-11-19 ENCOUNTER — Encounter: Payer: Self-pay | Admitting: Family Medicine

## 2017-11-19 MED ORDER — BUPRENORPHINE HCL-NALOXONE HCL 8-2 MG SL FILM: 1.0000 | ORAL_FILM | Freq: Every day | SUBLINGUAL | 0 refills | Status: DC

## 2017-11-24 ENCOUNTER — Other Ambulatory Visit (HOSPITAL_COMMUNITY): Payer: Self-pay | Admitting: *Deleted

## 2017-11-24 ENCOUNTER — Ambulatory Visit (HOSPITAL_COMMUNITY)
Admission: RE | Admit: 2017-11-24 | Discharge: 2017-11-24 | Disposition: A | Discharge status: Home / Self Care | Payer: Medicaid Other | Source: Ambulatory Visit | Attending: Family Medicine | Admitting: Family Medicine

## 2017-11-24 DIAGNOSIS — Z3A31 31 weeks gestation of pregnancy: Secondary | ICD-10-CM | POA: Diagnosis not present

## 2017-11-24 DIAGNOSIS — Z362 Encounter for other antenatal screening follow-up: Secondary | ICD-10-CM | POA: Diagnosis not present

## 2017-11-24 DIAGNOSIS — F1120 Opioid dependence, uncomplicated: Secondary | ICD-10-CM

## 2017-11-24 DIAGNOSIS — O0933 Supervision of pregnancy with insufficient antenatal care, third trimester: Secondary | ICD-10-CM

## 2017-11-24 DIAGNOSIS — O99323 Drug use complicating pregnancy, third trimester: Secondary | ICD-10-CM | POA: Diagnosis not present

## 2017-11-24 DIAGNOSIS — O09899 Supervision of other high risk pregnancies, unspecified trimester: Secondary | ICD-10-CM

## 2017-11-24 DIAGNOSIS — F329 Major depressive disorder, single episode, unspecified: Secondary | ICD-10-CM

## 2017-11-24 DIAGNOSIS — F1910 Other psychoactive substance abuse, uncomplicated: Secondary | ICD-10-CM | POA: Insufficient documentation

## 2017-11-24 DIAGNOSIS — F419 Anxiety disorder, unspecified: Secondary | ICD-10-CM

## 2017-11-24 MED ORDER — PREPLUS 27-1 MG PO TABS: 1.0000 | ORAL_TABLET | Freq: Every day | ORAL | 3 refills | Status: DC

## 2017-11-24 NOTE — Telephone Encounter (Signed)
[facility]

## 2017-11-24 NOTE — Addendum Note (Signed)
Addended by: Levie HeritageSTINSON, Leiam Hopwood J on: 11/24/2017 03:50 PM   Modules accepted: Orders

## 2017-11-26 ENCOUNTER — Ambulatory Visit (INDEPENDENT_AMBULATORY_CARE_PROVIDER_SITE_OTHER): Payer: Self-pay | Admitting: Family Medicine

## 2017-11-26 VITALS — BP 111/75 | HR 78 | Wt 160.1 lb

## 2017-11-26 DIAGNOSIS — O99323 Drug use complicating pregnancy, third trimester: Secondary | ICD-10-CM

## 2017-11-26 DIAGNOSIS — O09899 Supervision of other high risk pregnancies, unspecified trimester: Secondary | ICD-10-CM

## 2017-11-26 DIAGNOSIS — Z23 Encounter for immunization: Secondary | ICD-10-CM

## 2017-11-26 DIAGNOSIS — F329 Major depressive disorder, single episode, unspecified: Secondary | ICD-10-CM

## 2017-11-26 DIAGNOSIS — F1190 Opioid use, unspecified, uncomplicated: Secondary | ICD-10-CM

## 2017-11-26 DIAGNOSIS — F1199 Opioid use, unspecified with unspecified opioid-induced disorder: Secondary | ICD-10-CM

## 2017-11-26 DIAGNOSIS — F419 Anxiety disorder, unspecified: Secondary | ICD-10-CM

## 2017-11-26 DIAGNOSIS — Z3A31 31 weeks gestation of pregnancy: Secondary | ICD-10-CM

## 2017-11-26 MED ORDER — FLUOXETINE HCL 40 MG PO CAPS: 40.0000 mg | ORAL_CAPSULE | Freq: Every day | ORAL | 3 refills | Status: DC

## 2017-11-26 NOTE — Progress Notes (Signed)
   PRENATAL VISIT NOTE  Subjective:  Marcia West is a 32 y.o. [id number] at 242w5d being seen today for ongoing prenatal care.  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  Patient reports no complaints.  Contractions: Irritability. Vag. Bleeding: None.  Movement: Present. Denies leaking of fluid.   The following portions of the patient's history were reviewed and updated as appropriate: allergies, current medications, past family history, past medical history, past social history, past surgical history and problem list. Problem list updated.  Objective:   Vitals:   11/26/17 1400  BP: 111/75  Pulse: 78  Weight: 160 lb 1.3 oz (72.6 kg)    Fetal Status: Fetal Heart Rate (bpm): 138 Fundal Height: 30 cm Movement: Present     General:  Alert, oriented and cooperative. Patient is in no acute distress.  Skin: Skin is warm and dry. No rash noted.   Cardiovascular: Normal heart rate noted  Respiratory: Normal respiratory effort, no problems with respiration noted  Abdomen: Soft, gravid, appropriate for gestational age.  Pain/Pressure: Present     Pelvic: Cervical exam deferred        Extremities: Normal range of motion.  Edema: None  Mental Status: Normal mood and affect. Normal behavior. Normal judgment and thought content.   Assessment and Plan:  Pregnancy: G3P1102 at 382w5d  1. Supervision of other high risk pregnancies, unspecified trimester FHT and FH normal - Flu Vaccine QUAD 36+ mos IM  2. Opioid use disorder (HCC) Controlled. No withdrawal symptoms. Stable on current dose.  3. Anxiety and depression Hving increased depression and anxiety. [facility]. Increase prozac to 40mg  daily  Preterm labor symptoms and general obstetric precautions  including but not limited to vaginal bleeding, contractions, leaking of fluid and fetal movement were reviewed in detail with the patient. Please refer to After Visit Summary for other counseling recommendations.  Return in about 3 weeks (around 12/17/2017).  Future Appointments  Date Time Provider Department Center  11/30/2017  [facility]  11/30/2017 10:50 AM W[facility]-BEHAVIORAL HEALTH CLINICIAN [facility] WOC  12/22/2017  1:30 PM [facility] US 5 [facility]-M[facility]US M[facility]-US    Levie HeritageJacob J Stinson, DO

## 2017-11-30 ENCOUNTER — Ambulatory Visit (INDEPENDENT_AMBULATORY_CARE_PROVIDER_SITE_OTHER): Payer: Medicaid Other | Admitting: Clinical

## 2017-11-30 ENCOUNTER — Institutional Professional Consult (permissible substitution): Payer: Medicaid Other

## 2017-11-30 DIAGNOSIS — F1110 Opioid abuse, uncomplicated: Secondary | ICD-10-CM

## 2017-11-30 DIAGNOSIS — F4323 Adjustment disorder with mixed anxiety and depressed mood: Secondary | ICD-10-CM

## 2017-11-30 NOTE — BH Specialist Note (Signed)
Integrated Behavioral Health Initial Visit  MRN: [id number] Name: Marcia West  Number of Integrated Behavioral Health Clinician visits:: 1/6 Session Start time: 10:30  Session End time: 11:31 Total time: 1 hour  Type of Service: Integrated Behavioral Health- Individual/Family Interpretor:No. Interpretor Name and Language: n/a   Warm Hand Off Completed.       SUBJECTIVE: Marcia West is a 32 y.o. female accompanied by Mother Patient was referred by Candelaria CelesteJacob Stinson, MD for anxiety and depression Patient reports the following symptoms/concerns: Pt states her primary concern today is at least two panic attacks per week, and feeling a lack of control over symptoms of anxiety and life in general; feeling bad [facility]. Pt says Prozac is helping with depressive symptoms, but anxiety continues; is very open to learning self-coping strategies to get through the remainder of pregnancy.  Duration of problem: Current pregnancy with increase after moving from [place] to [place]  ; Severity of problem: moderately severe  OBJECTIVE: Mood: Anxious and Affect: Appropriate Risk of harm to self or others: No plan to harm self or others  LIFE CONTEXT: Family and Social: Pt lives with her mother; family in [place] help support pt. Pt's two children(7yo; 5yo live in [place]) School/Work: - Self-Care: Suboxone maintenance; uses coloring and learning to crochet to cope with anxious feelings Life Changes: Current pregnancy, move from [place] ("doing a lot of drugs") to [place] to improve surroundings with support of her mother  GOALS ADDRESSED: Patient will: 1. Reduce symptoms of: anxiety, depression and stress 2. Increase knowledge and/or ability of: healthy habits and self-management skills  3. Demonstrate ability to: Increase healthy adjustment to current life circumstances and Increase adequate support systems for patient/family  INTERVENTIONS: Interventions utilized: Mindfulness or Management consultantelaxation Training,  Psychoeducation and/or Health Education and Link to [facility]  Standardized Assessments completed: GAD-7 and PHQ 9  ASSESSMENT: Patient currently experiencing Adjustment disorder with mixed anxiety and depressed mood, and Opioid use disorder, on maintenance therapy   Patient may benefit from psychoeducation and brief therapeutic interventions regarding coping with symptoms of anxiety and depression.  PLAN: 1. Follow up with behavioral health clinician on : One week via phone 2. Behavioral recommendations:  -Continue taking BH medication, as prescribed by medical provider (Prozac and Suboxone) -CALM relaxation breathing exercise at least twice daily (with morning prenatal vitamin; at bedtime) -Read educational materials regarding coping with symptoms of anxiety with panic attacks and depression -Continue using self-coping strategies that are are helping; consider apps discussed for additional daily coping -Be mindful of daily caffeine consumption, in relation to symptoms of anxiety(Consider keeping track to inform medical provider at next visit) -[facility], or community provider of choice, for ongoing therapy and BH med management, prior to postpartum 3. Referral(s): Integrated Art gallery managerBehavioral Health Services (In Clinic) and [facility] (L[facility]/Outside Clinic) 4. "From scale of 1-10, how likely are you to follow plan?": 9  Rae LipsJamie C , LCSW  Depression screen [facility] 2/9 11/30/2017  Decreased Interest 3  Down, Depressed, Hopeless 2  PHQ - 2 Score 5  Altered sleeping 2  Tired, decreased energy 3  Change in appetite 1  Feeling bad or failure about yourself  3  Trouble concentrating 1  Moving slowly or fidgety/restless 2  Suicidal thoughts 0  PHQ-9 Score 17   GAD 7 : Generalized Anxiety Score 11/30/2017  Nervous, Anxious, on Edge 3  Control/stop worrying 3  Worry too much - different things 3  Trouble  relaxing 3  Restless 2  Easily annoyed or irritable 2  Afraid - awful might happen 2  Total GAD 7 Score 18

## 2017-12-02 ENCOUNTER — Telehealth: Payer: Self-pay | Admitting: *Deleted

## 2017-12-02 NOTE — Telephone Encounter (Signed)
Mayah called and left a message she saw Asher MuirJamie this week and wanted to know if Asher MuirJamie was able to get an rx from Dr. Adrian BlackwaterStinson

## 2017-12-03 MED ORDER — BUPRENORPHINE HCL-NALOXONE HCL 8-2 MG SL FILM: 1.0000 | ORAL_FILM | Freq: Every day | SUBLINGUAL | 0 refills | Status: DC

## 2017-12-07 ENCOUNTER — Telehealth: Payer: Self-pay | Admitting: Clinical

## 2017-12-07 NOTE — Telephone Encounter (Signed)
Attempt to follow up with pt, as agreed-upon by pt; voicemail is full, so no message left.

## 2017-12-17 ENCOUNTER — Ambulatory Visit (INDEPENDENT_AMBULATORY_CARE_PROVIDER_SITE_OTHER): Payer: Self-pay | Admitting: Family Medicine

## 2017-12-17 VITALS — BP 116/79 | HR 88 | Wt 157.0 lb

## 2017-12-17 DIAGNOSIS — F419 Anxiety disorder, unspecified: Secondary | ICD-10-CM

## 2017-12-17 DIAGNOSIS — F1190 Opioid use, unspecified, uncomplicated: Secondary | ICD-10-CM

## 2017-12-17 DIAGNOSIS — O09893 Supervision of other high risk pregnancies, third trimester: Secondary | ICD-10-CM

## 2017-12-17 DIAGNOSIS — F329 Major depressive disorder, single episode, unspecified: Secondary | ICD-10-CM

## 2017-12-17 DIAGNOSIS — F1199 Opioid use, unspecified with unspecified opioid-induced disorder: Secondary | ICD-10-CM

## 2017-12-17 DIAGNOSIS — O09899 Supervision of other high risk pregnancies, unspecified trimester: Secondary | ICD-10-CM

## 2017-12-17 DIAGNOSIS — O98513 Other viral diseases complicating pregnancy, third trimester: Secondary | ICD-10-CM

## 2017-12-17 DIAGNOSIS — Z3A34 34 weeks gestation of pregnancy: Secondary | ICD-10-CM

## 2017-12-17 DIAGNOSIS — B009 Herpesviral infection, unspecified: Secondary | ICD-10-CM

## 2017-12-17 MED ORDER — BUSPIRONE HCL 5 MG PO TABS: 5.0000 mg | ORAL_TABLET | Freq: Two times a day (BID) | ORAL | 3 refills | Status: DC

## 2017-12-17 MED ORDER — BUPRENORPHINE HCL-NALOXONE HCL 8-2 MG SL FILM: 1.0000 | ORAL_FILM | Freq: Every day | SUBLINGUAL | 0 refills | Status: DC

## 2017-12-17 NOTE — Progress Notes (Signed)
   PRENATAL VISIT NOTE  Subjective:  Marcia West is a 32 y.o. [id number] at 3275w5d being seen today for ongoing prenatal care.  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  Patient reports anxiety.  Contractions: Irritability. Vag. Bleeding: None.  Movement: Present. Denies leaking of fluid.   The following portions of the patient's history were reviewed and updated as appropriate: allergies, current medications, past family history, past medical history, past social history, past surgical history and problem list. Problem list updated.  Objective:   Vitals:   12/17/17 1355  BP: 116/79  Pulse: 88  Weight: 157 lb (71.2 kg)    Fetal Status: Fetal Heart Rate (bpm): 145 Fundal Height: 32 cm Movement: Present  Presentation: Vertex  General:  Alert, oriented and cooperative. Patient is in no acute distress.  Skin: Skin is warm and dry. No rash noted.   Cardiovascular: Normal heart rate noted  Respiratory: Normal respiratory effort, no problems with respiration noted  Abdomen: Soft, gravid, appropriate for gestational age.  Pain/Pressure: Present     Pelvic: Cervical exam deferred        Extremities: Normal range of motion.  Edema: None  Mental Status: Normal mood and affect. Normal behavior. Normal judgment and thought content.   Assessment and Plan:  Pregnancy: G3P1102 at 8375w5d  1. Supervision of other high risk pregnancies, unspecified trimester FHT normal. FH a little low. Has growth US next week - ToxASSURE Select 13 (MW), Urine  2. Anxiety and depression Continue prozac. Add buspar.  3. Herpes virus infection in mother during third trimester of pregnancy On valtrex  4. Opioid use disorder (HCC) No complaints. Compliant with therapy. No relapse. No withdrawal  symptoms.  UDS today.  - ToxASSURE Select 13 (MW), Urine  Preterm labor symptoms and general obstetric precautions including but not limited to vaginal bleeding, contractions, leaking of fluid and fetal movement were reviewed in detail with the patient. Please refer to After Visit Summary for other counseling recommendations.  Return in about 2 weeks (around 12/31/2017) for OB f/u.  Future Appointments  Date Time Provider Department Center  12/22/2017  1:30 PM [facility] US 5 [facility]-US    Levie HeritageJacob J Tomicka Lover, DO

## 2017-12-22 ENCOUNTER — Ambulatory Visit (HOSPITAL_COMMUNITY): Payer: Medicaid Other

## 2017-12-23 ENCOUNTER — Ambulatory Visit (HOSPITAL_COMMUNITY)
Admission: RE | Admit: 2017-12-23 | Payer: Medicaid Other | Source: Ambulatory Visit | Attending: Obstetrics and Gynecology | Admitting: Obstetrics and Gynecology

## 2017-12-24 LAB — TOXASSURE SELECT 13 (MW), URINE

## 2017-12-28 ENCOUNTER — Ambulatory Visit (HOSPITAL_COMMUNITY)
Admission: RE | Admit: 2017-12-28 | Discharge: 2017-12-28 | Disposition: A | Discharge status: Home / Self Care | Payer: Medicaid Other | Source: Ambulatory Visit | Attending: Obstetrics and Gynecology | Admitting: Obstetrics and Gynecology

## 2017-12-28 ENCOUNTER — Encounter (HOSPITAL_COMMUNITY): Payer: Self-pay

## 2017-12-28 DIAGNOSIS — F1910 Other psychoactive substance abuse, uncomplicated: Secondary | ICD-10-CM | POA: Insufficient documentation

## 2017-12-28 DIAGNOSIS — O0933 Supervision of pregnancy with insufficient antenatal care, third trimester: Secondary | ICD-10-CM | POA: Diagnosis not present

## 2017-12-28 DIAGNOSIS — F1120 Opioid dependence, uncomplicated: Secondary | ICD-10-CM | POA: Diagnosis not present

## 2017-12-28 DIAGNOSIS — O99323 Drug use complicating pregnancy, third trimester: Secondary | ICD-10-CM | POA: Diagnosis not present

## 2017-12-28 DIAGNOSIS — Z3A36 36 weeks gestation of pregnancy: Secondary | ICD-10-CM | POA: Diagnosis not present

## 2017-12-28 DIAGNOSIS — Z362 Encounter for other antenatal screening follow-up: Secondary | ICD-10-CM

## 2017-12-28 DIAGNOSIS — O09899 Supervision of other high risk pregnancies, unspecified trimester: Secondary | ICD-10-CM

## 2017-12-31 ENCOUNTER — Telehealth: Payer: Self-pay

## 2017-12-31 ENCOUNTER — Other Ambulatory Visit (HOSPITAL_COMMUNITY)
Admission: RE | Admit: 2017-12-31 | Discharge: 2017-12-31 | Disposition: A | Discharge status: Home / Self Care | Payer: Medicaid Other | Source: Ambulatory Visit | Attending: Family Medicine | Admitting: Family Medicine

## 2017-12-31 ENCOUNTER — Ambulatory Visit (INDEPENDENT_AMBULATORY_CARE_PROVIDER_SITE_OTHER): Payer: Self-pay | Admitting: Family Medicine

## 2017-12-31 VITALS — BP 123/71 | HR 93 | Wt 164.0 lb

## 2017-12-31 DIAGNOSIS — F329 Major depressive disorder, single episode, unspecified: Secondary | ICD-10-CM

## 2017-12-31 DIAGNOSIS — F419 Anxiety disorder, unspecified: Secondary | ICD-10-CM

## 2017-12-31 DIAGNOSIS — O09899 Supervision of other high risk pregnancies, unspecified trimester: Secondary | ICD-10-CM | POA: Diagnosis not present

## 2017-12-31 DIAGNOSIS — F1199 Opioid use, unspecified with unspecified opioid-induced disorder: Secondary | ICD-10-CM

## 2017-12-31 DIAGNOSIS — F1190 Opioid use, unspecified, uncomplicated: Secondary | ICD-10-CM

## 2017-12-31 DIAGNOSIS — O09893 Supervision of other high risk pregnancies, third trimester: Secondary | ICD-10-CM

## 2017-12-31 LAB — OB RESULTS CONSOLE GBS: GBS: NEGATIVE

## 2017-12-31 LAB — OB RESULTS CONSOLE GC/CHLAMYDIA: Gonorrhea: NEGATIVE

## 2017-12-31 MED ORDER — BUPRENORPHINE HCL-NALOXONE HCL 8-2 MG SL FILM: 1.0000 | ORAL_FILM | Freq: Every day | SUBLINGUAL | 0 refills | Status: DC

## 2017-12-31 NOTE — Progress Notes (Signed)
   PRENATAL VISIT NOTE  Subjective:  Nelva NayRachael Molinelli is a 32 y.o. [id number] at 26102w5d being seen today for ongoing prenatal care.  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  Patient reports no complaints.  Contractions: Irritability. Vag. Bleeding: None.  Movement: Present. Denies leaking of fluid.   The following portions of the patient's history were reviewed and updated as appropriate: allergies, current medications, past family history, past medical history, past social history, past surgical history and problem list. Problem list updated.  Objective:   Vitals:   12/31/17 0912  BP: 123/71  Pulse: 93  Weight: 164 lb (74.4 kg)    Fetal Status: Fetal Heart Rate (bpm): 145 Fundal Height: 34 cm Movement: Present  Presentation: Vertex  General:  Alert, oriented and cooperative. Patient is in no acute distress.  Skin: Skin is warm and dry. No rash noted.   Cardiovascular: Normal heart rate noted  Respiratory: Normal respiratory effort, no problems with respiration noted  Abdomen: Soft, gravid, appropriate for gestational age.  Pain/Pressure: Present     Pelvic: Cervical exam deferred Dilation: Fingertip Effacement (%): 50 Station: -3  Extremities: Normal range of motion.  Edema: None  Mental Status: Normal mood and affect. Normal behavior. Normal judgment and thought content.   Assessment and Plan:  Pregnancy: G3P1102 at 62102w5d  1. Supervision of other high risk pregnancies, unspecified trimester FHT normal. Good interval growth on US. - GC/Chlamydia probe amp ([place] - Culture, beta strep (group b only)  2. Anxiety and depression Improved with addition of buspar  3. Opioid use disorder (HCC) Stable.   Preterm labor symptoms and general  obstetric precautions including but not limited to vaginal bleeding, contractions, leaking of fluid and fetal movement were reviewed in detail with the patient. Please refer to After Visit Summary for other counseling recommendations.  Return in about 1 week (around 01/07/2018) for OB f/u.  No future appointments.  Levie HeritageJacob J Jayon Matton, DO

## 2017-12-31 NOTE — Telephone Encounter (Signed)
Called NICU for NICU consult/ tour. Left patient name for NP to contact patient. Marcia StammerJennifer Canaan Prue RN

## 2018-01-01 LAB — GC/CHLAMYDIA PROBE AMP (~~LOC~~) NOT AT ARMC
CHLAMYDIA, DNA PROBE: NEGATIVE
NEISSERIA GONORRHEA: NEGATIVE

## 2018-01-04 LAB — CULTURE, BETA STREP (GROUP B ONLY): Strep Gp B Culture: NEGATIVE

## 2018-01-07 ENCOUNTER — Ambulatory Visit (INDEPENDENT_AMBULATORY_CARE_PROVIDER_SITE_OTHER): Payer: Medicaid Other | Admitting: Family Medicine

## 2018-01-07 VITALS — BP 116/74 | HR 96 | Wt 164.0 lb

## 2018-01-07 DIAGNOSIS — O98513 Other viral diseases complicating pregnancy, third trimester: Secondary | ICD-10-CM

## 2018-01-07 DIAGNOSIS — F32A Depression, unspecified: Secondary | ICD-10-CM

## 2018-01-07 DIAGNOSIS — F1199 Opioid use, unspecified with unspecified opioid-induced disorder: Secondary | ICD-10-CM

## 2018-01-07 DIAGNOSIS — F329 Major depressive disorder, single episode, unspecified: Secondary | ICD-10-CM

## 2018-01-07 DIAGNOSIS — O09893 Supervision of other high risk pregnancies, third trimester: Secondary | ICD-10-CM

## 2018-01-07 DIAGNOSIS — F419 Anxiety disorder, unspecified: Secondary | ICD-10-CM

## 2018-01-07 DIAGNOSIS — F1190 Opioid use, unspecified, uncomplicated: Secondary | ICD-10-CM

## 2018-01-07 DIAGNOSIS — B009 Herpesviral infection, unspecified: Secondary | ICD-10-CM

## 2018-01-07 DIAGNOSIS — O09899 Supervision of other high risk pregnancies, unspecified trimester: Secondary | ICD-10-CM

## 2018-01-07 MED ORDER — BUPRENORPHINE HCL-NALOXONE HCL 8-2 MG SL FILM: 1.2500 | ORAL_FILM | Freq: Every day | SUBLINGUAL | 0 refills | Status: DC

## 2018-01-07 NOTE — Progress Notes (Signed)
   PRENATAL VISIT NOTE  Subjective:  Marcia West is a 32 y.o. [id number] at 5213w5d being seen today for ongoing prenatal care.  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  Patient reports has mild withdrawal symptoms last week. Took additional 1/4 of a film. .  Contractions: Irregular. Vag. Bleeding: None.  Movement: Present. Denies leaking of fluid.   The following portions of the patient's history were reviewed and updated as appropriate: allergies, current medications, past family history, past medical history, past social history, past surgical history and problem list. Problem list updated.  Objective:   Vitals:   01/07/18 1408  BP: 116/74  Pulse: 96  Weight: 164 lb 0.6 oz (74.4 kg)    Fetal Status: Fetal Heart Rate (bpm): 136   Movement: Present     General:  Alert, oriented and cooperative. Patient is in no acute distress.  Skin: Skin is warm and dry. No rash noted.   Cardiovascular: Normal heart rate noted  Respiratory: Normal respiratory effort, no problems with respiration noted  Abdomen: Soft, gravid, appropriate for gestational age.  Pain/Pressure: Present     Pelvic: Cervical exam deferred        Extremities: Normal range of motion.  Edema: None  Mental Status: Normal mood and affect. Normal behavior. Normal judgment and thought content.   Assessment and Plan:  Pregnancy: G3P1102 at 5013w5d  1. Supervision of other high risk pregnancies, unspecified trimester FHT  2. Anxiety and depression Controlled  3. Opioid use disorder (HCC) Increase suboxone to 1.25 film daily  4. Herpes virus infection in mother during third trimester of pregnancy valtrex  Term labor symptoms and general obstetric precautions including but not limited to vaginal  bleeding, contractions, leaking of fluid and fetal movement were reviewed in detail with the patient. Please refer to After Visit Summary for other counseling recommendations.  No follow-ups on file.  Future Appointments  Date Time Provider Department Center  01/15/2018  9:30 AM Reva BoresPratt, Tanya S, MD [facility] None    Levie HeritageJacob J Stinson, DO

## 2018-01-13 NOTE — L&D Delivery Note (Addendum)
Delivery Note At 11:48 PM a viable and healthy female was delivered via Vaginal, Spontaneous (Presentation: LOA;cephalic  ).  APGAR: 9, 9; Placenta status: spontaneous  Intact. Cord: 3 vessel  with no complications.  Anesthesia:  Epidural, injectable lidocaine Episiotomy: None Lacerations: 1st degree;Periurethral;Labial Suture Repair: 3.0 vicryl Est. Blood Loss (mL):   Mom to postpartum.  Baby to Couplet care / Skin to Skin.  Myrene Buddy 01/19/2018, 12:23 AM  Attestation: I was present for the entire delivery and assisted with the repair. Patient admitted in active labor and progressed to complete without augmentation.   Cristal Deer. Earlene Plater, DO OB/GYN Fellow

## 2018-01-15 ENCOUNTER — Ambulatory Visit (INDEPENDENT_AMBULATORY_CARE_PROVIDER_SITE_OTHER): Payer: Medicaid Other | Admitting: Family Medicine

## 2018-01-15 VITALS — BP 108/74 | HR 91 | Wt 165.5 lb

## 2018-01-15 DIAGNOSIS — O98513 Other viral diseases complicating pregnancy, third trimester: Secondary | ICD-10-CM

## 2018-01-15 DIAGNOSIS — O09899 Supervision of other high risk pregnancies, unspecified trimester: Secondary | ICD-10-CM

## 2018-01-15 DIAGNOSIS — B009 Herpesviral infection, unspecified: Secondary | ICD-10-CM

## 2018-01-15 DIAGNOSIS — F1199 Opioid use, unspecified with unspecified opioid-induced disorder: Secondary | ICD-10-CM

## 2018-01-15 DIAGNOSIS — F1190 Opioid use, unspecified, uncomplicated: Secondary | ICD-10-CM

## 2018-01-15 DIAGNOSIS — O09893 Supervision of other high risk pregnancies, third trimester: Secondary | ICD-10-CM

## 2018-01-15 NOTE — Patient Instructions (Signed)

## 2018-01-15 NOTE — Progress Notes (Signed)
   PRENATAL VISIT NOTE  Subjective:  Marcia West is a 33 y.o. [id number] at 104w6d being seen today for ongoing prenatal care.  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  Patient reports no complaints.  Contractions: Irregular. Vag. Bleeding: None.  Movement: Present. Denies leaking of fluid.   The following portions of the patient's history were reviewed and updated as appropriate: allergies, current medications, past family history, past medical history, past social history, past surgical history and problem list. Problem list updated.  Objective:   Vitals:   01/15/18 0930  BP: 108/74  Pulse: 91  Weight: 165 lb 8 oz (75.1 kg)    Fetal Status: Fetal Heart Rate (bpm): 122 Fundal Height: 32 cm Movement: Present  Presentation: Vertex  General:  Alert, oriented and cooperative. Patient is in no acute distress.  Skin: Skin is warm and dry. No rash noted.   Cardiovascular: Normal heart rate noted  Respiratory: Normal respiratory effort, no problems with respiration noted  Abdomen: Soft, gravid, appropriate for gestational age.  Pain/Pressure: Present     Pelvic: Cervical exam deferred        Extremities: Normal range of motion.  Edema: None  Mental Status: Normal mood and affect. Normal behavior. Normal judgment and thought content.   Assessment and Plan:  Pregnancy: G3P1102 at [redacted]w[redacted]d  1. Supervision of other high risk pregnancies, unspecified trimester Continue prenatal care.   2. Opioid use disorder [facility]) Specifically denies issues with meds, withdrawal symptoms. Does not need refills   3. Herpes virus infection in mother during third trimester of pregnancy On Valtrex  Term labor symptoms and general obstetric precautions including but not limited to vaginal  bleeding, contractions, leaking of fluid and fetal movement were reviewed in detail with the patient. Please refer to After Visit Summary for other counseling recommendations.  Return in 1 week (on 01/22/2018).  Future Appointments  Date Time Provider Department Center  01/21/2018  1:30 PM Willodean Rosenthal, MD [facility] None    Reva Bores, MD

## 2018-01-18 ENCOUNTER — Encounter (HOSPITAL_COMMUNITY): Payer: Self-pay | Admitting: *Deleted

## 2018-01-18 ENCOUNTER — Inpatient Hospital Stay (HOSPITAL_COMMUNITY): Payer: Medicaid Other | Admitting: Anesthesiology

## 2018-01-18 ENCOUNTER — Other Ambulatory Visit: Payer: Self-pay

## 2018-01-18 ENCOUNTER — Inpatient Hospital Stay (HOSPITAL_COMMUNITY)
Admission: AD | Admit: 2018-01-18 | Discharge: 2018-01-20 | DRG: 806 | Disposition: A | Discharge status: Home / Self Care | Payer: Medicaid Other | Attending: Obstetrics and Gynecology | Admitting: Obstetrics and Gynecology

## 2018-01-18 DIAGNOSIS — F17210 Nicotine dependence, cigarettes, uncomplicated: Secondary | ICD-10-CM | POA: Diagnosis present

## 2018-01-18 DIAGNOSIS — A6000 Herpesviral infection of urogenital system, unspecified: Secondary | ICD-10-CM | POA: Diagnosis present

## 2018-01-18 DIAGNOSIS — O99324 Drug use complicating childbirth: Secondary | ICD-10-CM | POA: Diagnosis present

## 2018-01-18 DIAGNOSIS — F1110 Opioid abuse, uncomplicated: Secondary | ICD-10-CM | POA: Diagnosis present

## 2018-01-18 DIAGNOSIS — O9832 Other infections with a predominantly sexual mode of transmission complicating childbirth: Secondary | ICD-10-CM | POA: Diagnosis present

## 2018-01-18 DIAGNOSIS — F319 Bipolar disorder, unspecified: Secondary | ICD-10-CM | POA: Diagnosis present

## 2018-01-18 DIAGNOSIS — O98519 Other viral diseases complicating pregnancy, unspecified trimester: Secondary | ICD-10-CM | POA: Diagnosis present

## 2018-01-18 DIAGNOSIS — O99344 Other mental disorders complicating childbirth: Secondary | ICD-10-CM | POA: Diagnosis present

## 2018-01-18 DIAGNOSIS — Z3A39 39 weeks gestation of pregnancy: Secondary | ICD-10-CM

## 2018-01-18 DIAGNOSIS — Z3483 Encounter for supervision of other normal pregnancy, third trimester: Secondary | ICD-10-CM | POA: Diagnosis present

## 2018-01-18 DIAGNOSIS — F329 Major depressive disorder, single episode, unspecified: Secondary | ICD-10-CM | POA: Diagnosis present

## 2018-01-18 DIAGNOSIS — O09899 Supervision of other high risk pregnancies, unspecified trimester: Secondary | ICD-10-CM

## 2018-01-18 DIAGNOSIS — O99334 Smoking (tobacco) complicating childbirth: Secondary | ICD-10-CM | POA: Diagnosis present

## 2018-01-18 DIAGNOSIS — B009 Herpesviral infection, unspecified: Secondary | ICD-10-CM | POA: Diagnosis present

## 2018-01-18 DIAGNOSIS — F419 Anxiety disorder, unspecified: Secondary | ICD-10-CM | POA: Diagnosis present

## 2018-01-18 DIAGNOSIS — F1199 Opioid use, unspecified with unspecified opioid-induced disorder: Secondary | ICD-10-CM | POA: Diagnosis present

## 2018-01-18 DIAGNOSIS — F1190 Opioid use, unspecified, uncomplicated: Secondary | ICD-10-CM | POA: Diagnosis present

## 2018-01-18 DIAGNOSIS — F32A Depression, unspecified: Secondary | ICD-10-CM | POA: Diagnosis present

## 2018-01-18 LAB — CBC
HCT: 37.7 % (ref 36.0–46.0)
Hemoglobin: 12.7 g/dL (ref 12.0–15.0)
MCH: 32.7 pg (ref 26.0–34.0)
MCHC: 33.7 g/dL (ref 30.0–36.0)
MCV: 97.2 fL (ref 80.0–100.0)
NRBC: 0 % (ref 0.0–0.2)
Platelets: 181 10*3/uL (ref 150–400)
RBC: 3.88 MIL/uL (ref 3.87–5.11)
RDW: 13.3 % (ref 11.5–15.5)
WBC: 8.8 10*3/uL (ref 4.0–10.5)

## 2018-01-18 LAB — TYPE AND SCREEN
ABO/RH(D): B POS
Antibody Screen: NEGATIVE

## 2018-01-18 MED ORDER — VALACYCLOVIR HCL 500 MG PO TABS
500.0000 mg | ORAL_TABLET | Freq: Two times a day (BID) | ORAL | Status: DC
  Filled 2018-01-18 (×2): qty 1

## 2018-01-18 MED ORDER — FENTANYL 2.5 MCG/ML BUPIVACAINE 1/10 % EPIDURAL INFUSION (WH - ANES): 14.0000 mL/h | INTRAMUSCULAR | Status: DC | PRN

## 2018-01-18 MED ORDER — OXYTOCIN BOLUS FROM INFUSION
500.0000 mL | Freq: Once | INTRAVENOUS | Status: AC
  Administered 2018-01-18: 500 mL via INTRAVENOUS

## 2018-01-18 MED ORDER — LACTATED RINGERS IV SOLN: 500.0000 mL | Freq: Once | INTRAVENOUS | Status: DC

## 2018-01-18 MED ORDER — OXYCODONE-ACETAMINOPHEN 5-325 MG PO TABS: 2.0000 | ORAL_TABLET | ORAL | Status: DC | PRN

## 2018-01-18 MED ORDER — PHENYLEPHRINE 40 MCG/ML (10ML) SYRINGE FOR IV PUSH (FOR BLOOD PRESSURE SUPPORT)
80.0000 ug | PREFILLED_SYRINGE | INTRAVENOUS | Status: DC | PRN
  Filled 2018-01-18 (×2): qty 10

## 2018-01-18 MED ORDER — BUPRENORPHINE HCL 8 MG SL SUBL: 8.0000 mg | SUBLINGUAL_TABLET | Freq: Every day | SUBLINGUAL | Status: DC

## 2018-01-18 MED ORDER — DIPHENHYDRAMINE HCL 50 MG/ML IJ SOLN: 12.5000 mg | INTRAMUSCULAR | Status: DC | PRN

## 2018-01-18 MED ORDER — PHENYLEPHRINE 40 MCG/ML (10ML) SYRINGE FOR IV PUSH (FOR BLOOD PRESSURE SUPPORT): 80.0000 ug | PREFILLED_SYRINGE | INTRAVENOUS | Status: DC | PRN

## 2018-01-18 MED ORDER — EPHEDRINE 5 MG/ML INJ
10.0000 mg | INTRAVENOUS | Status: DC | PRN
  Filled 2018-01-18: qty 2

## 2018-01-18 MED ORDER — LACTATED RINGERS IV SOLN
INTRAVENOUS | Status: DC
  Administered 2018-01-18 (×2): via INTRAVENOUS

## 2018-01-18 MED ORDER — LIDOCAINE HCL (PF) 1 % IJ SOLN
INTRAMUSCULAR | Status: DC | PRN
  Administered 2018-01-18: 5 mL via EPIDURAL

## 2018-01-18 MED ORDER — LIDOCAINE HCL (PF) 1 % IJ SOLN
30.0000 mL | INTRAMUSCULAR | Status: DC | PRN
  Administered 2018-01-19: 30 mL via SUBCUTANEOUS
  Filled 2018-01-18: qty 30

## 2018-01-18 MED ORDER — EPHEDRINE 5 MG/ML INJ: 10.0000 mg | INTRAVENOUS | Status: DC | PRN

## 2018-01-18 MED ORDER — FENTANYL 2.5 MCG/ML BUPIVACAINE 1/10 % EPIDURAL INFUSION (WH - ANES)
14.0000 mL/h | INTRAMUSCULAR | Status: DC | PRN
  Administered 2018-01-18: 14 mL/h via EPIDURAL
  Filled 2018-01-18: qty 100

## 2018-01-18 MED ORDER — BUPRENORPHINE HCL 8 MG SL SUBL: 10.0000 mg | SUBLINGUAL_TABLET | Freq: Every day | SUBLINGUAL | Status: DC

## 2018-01-18 MED ORDER — LACTATED RINGERS IV SOLN
500.0000 mL | INTRAVENOUS | Status: DC | PRN
  Administered 2018-01-18: 500 mL via INTRAVENOUS

## 2018-01-18 MED ORDER — OXYCODONE-ACETAMINOPHEN 5-325 MG PO TABS: 1.0000 | ORAL_TABLET | ORAL | Status: DC | PRN

## 2018-01-18 MED ORDER — ACETAMINOPHEN 325 MG PO TABS: 650.0000 mg | ORAL_TABLET | ORAL | Status: DC | PRN

## 2018-01-18 MED ORDER — SOD CITRATE-CITRIC ACID 500-334 MG/5ML PO SOLN: 30.0000 mL | ORAL | Status: DC | PRN

## 2018-01-18 MED ORDER — ONDANSETRON HCL 4 MG/2ML IJ SOLN: 4.0000 mg | Freq: Four times a day (QID) | INTRAMUSCULAR | Status: DC | PRN

## 2018-01-18 MED ORDER — PHENYLEPHRINE 40 MCG/ML (10ML) SYRINGE FOR IV PUSH (FOR BLOOD PRESSURE SUPPORT)
80.0000 ug | PREFILLED_SYRINGE | INTRAVENOUS | Status: DC | PRN
  Filled 2018-01-18: qty 10

## 2018-01-18 MED ORDER — OXYTOCIN 40 UNITS IN LACTATED RINGERS INFUSION - SIMPLE MED
2.5000 [IU]/h | INTRAVENOUS | Status: DC
  Filled 2018-01-18: qty 1000

## 2018-01-18 MED ORDER — FLEET ENEMA 7-19 GM/118ML RE ENEM: 1.0000 | ENEMA | RECTAL | Status: DC | PRN

## 2018-01-18 MED ORDER — BUSPIRONE HCL 5 MG PO TABS
5.0000 mg | ORAL_TABLET | Freq: Two times a day (BID) | ORAL | Status: DC
  Filled 2018-01-18 (×2): qty 1

## 2018-01-18 MED ORDER — FLUOXETINE HCL 20 MG PO CAPS
40.0000 mg | ORAL_CAPSULE | Freq: Every day | ORAL | Status: DC
  Filled 2018-01-18: qty 2

## 2018-01-18 NOTE — Anesthesia Procedure Notes (Signed)
Epidural Patient location during procedure: OB Start time: 01/18/2018 9:35 PM End time: 01/18/2018 9:49 PM  Staffing Anesthesiologist: Trevor Iha, MD Performed: anesthesiologist   Preanesthetic Checklist Completed: patient identified, site marked, surgical consent, pre-op evaluation, timeout performed, IV checked, risks and benefits discussed and monitors and equipment checked  Epidural Patient position: sitting Prep: site prepped and draped and DuraPrep Patient monitoring: continuous pulse ox and blood pressure Approach: midline Location: L3-L4 Injection technique: LOR air  Needle:  Needle type: Tuohy  Needle gauge: 17 G Needle length: 9 cm and 9 Needle insertion depth: 5 cm cm Catheter type: closed end flexible Catheter size: 19 Gauge Catheter at skin depth: 10 cm Test dose: negative  Assessment Events: blood not aspirated, injection not painful, no injection resistance, negative IV test and no paresthesia  Additional Notes Patient identified. Risks/Benefits/Options discussed with patient including but not limited to bleeding, infection, nerve damage, paralysis, failed block, incomplete pain control, headache, blood pressure changes, nausea, vomiting, reactions to medication both or allergic, itching and postpartum back pain. Confirmed with bedside nurse the patient's most recent platelet count. Confirmed with patient that they are not currently taking any anticoagulation, have any bleeding history or any family history of bleeding disorders. Patient expressed understanding and wished to proceed. All questions were answered. Sterile technique was used throughout the entire procedure. Please see nursing notes for vital signs. Test dose was given through epidural needle and negative prior to continuing to dose epidural or start infusion. Warning signs of high block given to the patient including shortness of breath, tingling/numbness in hands, complete motor block, or any  concerning symptoms with instructions to call for help. Patient was given instructions on fall risk and not to get out of bed. All questions and concerns addressed with instructions to call with any issues. 1 Attempt (S) . Patient tolerated procedure well.

## 2018-01-18 NOTE — Anesthesia Preprocedure Evaluation (Signed)
Anesthesia Evaluation  Patient identified by MRN, date of birth, ID band Patient awake    Reviewed: Allergy & Precautions, NPO status , Patient's Chart, lab work & pertinent test results  Airway Mallampati: II  TM Distance: >3 FB Neck ROM: Full    Dental no notable dental hx. (+) Teeth Intact   Pulmonary Current Smoker,    Pulmonary exam normal breath sounds clear to auscultation       Cardiovascular Normal cardiovascular exam Rhythm:Regular Rate:Normal     Neuro/Psych Bipolar Disorder negative neurological ROS     GI/Hepatic negative GI ROS, (+)     substance abuse  ,   Endo/Other  negative endocrine ROS  Renal/GU negative Renal ROS  negative genitourinary   Musculoskeletal   Abdominal   Peds negative pediatric ROS (+)  Hematology negative hematology ROS (+)   Anesthesia Other Findings HSV during this pregnancy  Reproductive/Obstetrics (+) Pregnancy                             Lab Results  Component Value Date   WBC 8.8 01/18/2018   HGB 12.7 01/18/2018   HCT 37.7 01/18/2018   MCV 97.2 01/18/2018   PLT 181 01/18/2018    Anesthesia Physical Anesthesia Plan  ASA: II  Anesthesia Plan: Epidural   Post-op Pain Management:    Induction:   PONV Risk Score and Plan:   Airway Management Planned:   Additional Equipment:   Intra-op Plan:   Post-operative Plan:   Informed Consent: I have reviewed the patients History and Physical, chart, labs and discussed the procedure including the risks, benefits and alternatives for the proposed anesthesia with the patient or authorized representative who has indicated his/her understanding and acceptance.     Plan Discussed with:   Anesthesia Plan Comments: (Hx of substance abuse)        Anesthesia Quick Evaluation

## 2018-01-18 NOTE — MAU Note (Signed)
Is 39+, been havng contractions every 4 min. Pretty sure she has lost her mucous plug.  Possible leaking the past couple days.2 prior vag del.  Was 1 cm 2 wks ago

## 2018-01-18 NOTE — H&P (Addendum)
LABOR AND DELIVERY ADMISSION HISTORY AND PHYSICAL NOTE  Marcia West is a 33 y.o. female [phone] with IUP at [redacted]w[redacted]d by [place] presenting for spontaneous onset of labor. Patient reports that she has been leaking fluid for the last 2-3 days. She reports positive fetal movement. She denies vaginal bleeding.  Prenatal History/Complications: [facility] Pregnancy complications:  - Opioid use disorder - on subutex  - History of genital herpes - on prophylaxis, no active lesions  - Anxiety and depression  Past Medical History: Past Medical History:  Diagnosis Date  . Anxiety   . Bipolar affective (HCC)   . Depression   . Herpes simplex type II infection   . Substance abuse [facility])     Past Surgical History: Past Surgical History:  Procedure Laterality Date  . NO PAST SURGERIES    . WISDOM TOOTH EXTRACTION      Obstetrical History: OB History    Gravida  3   Para  2   Term  1   Preterm  1   AB      Living  2     SAB      TAB      Ectopic      Multiple      Live Births  2           Social History: Social History   Socioeconomic History  . Marital status: Single    Spouse name: Not on file  . Number of children: Not on file  . Years of education: Not on file  . Highest education level: Not on file  Occupational History  . Not on file  Social Needs  . Financial resource strain: Not on file  . Food insecurity:    Worry: Not on file    Inability: Not on file  . Transportation needs:    Medical: Not on file    Non-medical: Not on file  Tobacco Use  . Smoking status: Current Every Day Smoker    Packs/day: 0.25    Types: Cigarettes  . Smokeless tobacco: Never Used  Substance and Sexual Activity  . Alcohol use: Not Currently    Frequency: Never  . Drug use: Not Currently    Types: Methamphetamines, Other-see comments, Marijuana    Comment: suboxone daily  . Sexual activity: Not Currently    Birth control/protection: None   Lifestyle  . Physical activity:    Days per week: Not on file    Minutes per session: Not on file  . Stress: Not on file  Relationships  . Social connections:    Talks on phone: Not on file    Gets together: Not on file    Attends religious service: Not on file    Active member of club or organization: Not on file    Attends meetings of clubs or organizations: Not on file    Relationship status: Not on file  Other Topics Concern  . Not on file  Social History Narrative  . Not on file    Family History: History reviewed. No pertinent family history.  Allergies: No Known Allergies  Medications Prior to Admission  Medication Sig Dispense Refill Last Dose  . Buprenorphine HCl-Naloxone HCl (SUBOXONE) 8-2 MG FILM Place 1 Film under the tongue daily. Take 1 1/4 film daily 35 each 0 01/18/2018 at Unknown time  . busPIRone (BUSPAR) 5 MG tablet Take 1 tablet (5 mg total) by mouth 2 (two) times daily. 60 tablet 3 01/18/2018  at Unknown time  . FLUoxetine (PROZAC) 40 MG capsule Take 1 capsule (40 mg total) by mouth daily. 30 capsule 3 01/18/2018 at Unknown time  . Prenatal Vit-Fe Fumarate-FA (PREPLUS) 27-1 MG TABS Take 1 tablet by mouth daily. 90 tablet 3 01/18/2018 at Unknown time  . valACYclovir (VALTREX) 500 MG tablet Take 1 tablet (500 mg total) by mouth 2 (two) times daily. 60 tablet 2 01/18/2018 at Unknown time  . diphenhydrAMINE (BENADRYL) 25 MG tablet Take 50 mg by mouth at bedtime as needed for sleep.   More than a month at Unknown time     Review of Systems  All systems reviewed and negative except as stated in HPI  Physical Exam Blood pressure 132/90, pulse 89, temperature 98.8 F (37.1 C), temperature source Oral, resp. rate 17, height 5\' 7"  (1.702 m), weight 74.3 kg, last menstrual period 03/29/2017, SpO2 99 %. General appearance: alert, oriented, NAD Lungs: normal respiratory effort  Heart: regular rate Abdomen: soft, non-tender; gravid, FH appropriate for GA Extremities: No  calf swelling or tenderness Presentation: cephalic Fetal monitoring: cat 1 Uterine activity: every 4-5 minutes, regular, some adequate Dilation: 5 Effacement (%): 60 Station: -3 Exam by:: Dr. Primitivo GauzeFletcher Bulging forebag through cervix No lesions of any kind, nothing consistent with active herpes infection  Prenatal labs: ABO, Rh: --/--/B POS Performed at [facility], [address] [place], [place] [address]  [phone](10/08 1555) Antibody:   Rubella: 5.11 (10/08 1120) RPR: Non Reactive (10/24 0930)  HBsAg: Negative (10/08 1120)  HIV: Non Reactive (10/24 0930)  GC/Chlamydia: neg GBS: Negative (12/19 0000)  1/fasting/2-hr GTT: 76/137/114 Genetic screening:  None performed Anatomy US: normal  Prenatal Transfer Tool  Maternal Diabetes: No Genetic Screening: Declined, late to prenatal care Maternal Ultrasounds/Referrals: Normal Fetal Ultrasounds or other Referrals:  None Maternal Substance Abuse:  No: prescribed suboxone Significant Maternal Medications:  Meds include: Other:  prozac, buspar, valtrex, suboxone Significant Maternal Lab Results: HSV +  Results for orders placed or performed during the hospital encounter of 01/18/18 (from the past 24 hour(s))  CBC   Collection Time: 01/18/18  7:45 PM  Result Value Ref Range   WBC 8.8 4.0 - 10.5 K/uL   RBC 3.88 3.87 - 5.11 MIL/uL   Hemoglobin 12.7 12.0 - 15.0 g/dL   HCT 40.937.7 81.136.0 - 91.446.0 %   MCV 97.2 80.0 - 100.0 fL   MCH 32.7 26.0 - 34.0 pg   MCHC 33.7 30.0 - 36.0 g/dL   RDW 78.213.3 95.611.5 - 21.315.5 %   Platelets 181 150 - 400 K/uL   nRBC 0.0 0.0 - 0.2 %    Patient Active Problem List   Diagnosis Date Noted  . Normal labor 01/18/2018  . Anxiety and depression 11/05/2017  . Supervision of other high risk pregnancies, unspecified trimester 10/22/2017  . Opioid use disorder (HCC) 10/22/2017  . 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 G3P1102 at [redacted]w[redacted]d here for spontaneous onset of labor.  Patient reports leaking fluid for a couple of days, but has bulging forebag through cervix so does not appear to have SROM.  #Labor: pitocin 2x2 #Pain: epidural #FWB: Cat 1 #ID:  HSV + #MOF:breast #MOC: OCPs #Circ:  N/A  Myrene BuddyJacob Fletcher MD PGY-2 Family Medicine Resident 01/18/2018, 8:46 PM   Attestation: I have seen this patient and agree with the resident's documentation. I have examined them separately, and we have discussed the plan of care.  Cristal DeerLaurel S. Earlene PlaterWallace, DO OB/GYN  Fellow

## 2018-01-19 ENCOUNTER — Encounter (HOSPITAL_COMMUNITY): Payer: Self-pay

## 2018-01-19 DIAGNOSIS — O99324 Drug use complicating childbirth: Secondary | ICD-10-CM

## 2018-01-19 DIAGNOSIS — Z3A39 39 weeks gestation of pregnancy: Secondary | ICD-10-CM

## 2018-01-19 DIAGNOSIS — F1110 Opioid abuse, uncomplicated: Secondary | ICD-10-CM

## 2018-01-19 DIAGNOSIS — O9832 Other infections with a predominantly sexual mode of transmission complicating childbirth: Secondary | ICD-10-CM

## 2018-01-19 LAB — RPR: RPR Ser Ql: NONREACTIVE

## 2018-01-19 MED ORDER — TETANUS-DIPHTH-ACELL PERTUSSIS 5-2.5-18.5 LF-MCG/0.5 IM SUSP: 0.5000 mL | Freq: Once | INTRAMUSCULAR | Status: DC

## 2018-01-19 MED ORDER — BUPRENORPHINE HCL 8 MG SL SUBL: 8.0000 mg | SUBLINGUAL_TABLET | Freq: Every day | SUBLINGUAL | Status: DC

## 2018-01-19 MED ORDER — COCONUT OIL OIL
1.0000 "application " | TOPICAL_OIL | Status: DC | PRN
  Administered 2018-01-20: 1 via TOPICAL
  Filled 2018-01-19: qty 120

## 2018-01-19 MED ORDER — PRENATAL MULTIVITAMIN CH
1.0000 | ORAL_TABLET | Freq: Every day | ORAL | Status: DC
  Administered 2018-01-19 – 2018-01-20 (×2): 1 via ORAL
  Filled 2018-01-19 (×2): qty 1

## 2018-01-19 MED ORDER — BUPRENORPHINE HCL 8 MG SL SUBL
8.0000 mg | SUBLINGUAL_TABLET | Freq: Every day | SUBLINGUAL | Status: DC
  Administered 2018-01-19 – 2018-01-20 (×2): 8 mg via SUBLINGUAL
  Filled 2018-01-19 (×2): qty 1

## 2018-01-19 MED ORDER — ACETAMINOPHEN 325 MG PO TABS: 650.0000 mg | ORAL_TABLET | ORAL | Status: DC | PRN

## 2018-01-19 MED ORDER — BENZOCAINE-MENTHOL 20-0.5 % EX AERO
1.0000 "application " | INHALATION_SPRAY | CUTANEOUS | Status: DC | PRN
  Administered 2018-01-19: 1 via TOPICAL
  Filled 2018-01-19: qty 56

## 2018-01-19 MED ORDER — IBUPROFEN 600 MG PO TABS
600.0000 mg | ORAL_TABLET | Freq: Four times a day (QID) | ORAL | Status: DC
  Administered 2018-01-19 – 2018-01-20 (×6): 600 mg via ORAL
  Filled 2018-01-19 (×6): qty 1

## 2018-01-19 MED ORDER — WITCH HAZEL-GLYCERIN EX PADS: 1.0000 "application " | MEDICATED_PAD | CUTANEOUS | Status: DC | PRN

## 2018-01-19 MED ORDER — SENNOSIDES-DOCUSATE SODIUM 8.6-50 MG PO TABS
2.0000 | ORAL_TABLET | ORAL | Status: DC
  Administered 2018-01-20: 2 via ORAL
  Filled 2018-01-19: qty 2

## 2018-01-19 MED ORDER — DIBUCAINE 1 % RE OINT: 1.0000 "application " | TOPICAL_OINTMENT | RECTAL | Status: DC | PRN

## 2018-01-19 MED ORDER — BUPRENORPHINE HCL 8 MG SL SUBL
4.0000 mg | SUBLINGUAL_TABLET | Freq: Every day | SUBLINGUAL | Status: DC
  Administered 2018-01-19: 4 mg via SUBLINGUAL
  Filled 2018-01-19: qty 1

## 2018-01-19 MED ORDER — ZOLPIDEM TARTRATE 5 MG PO TABS: 5.0000 mg | ORAL_TABLET | Freq: Every evening | ORAL | Status: DC | PRN

## 2018-01-19 MED ORDER — DIPHENHYDRAMINE HCL 25 MG PO CAPS: 25.0000 mg | ORAL_CAPSULE | Freq: Four times a day (QID) | ORAL | Status: DC | PRN

## 2018-01-19 MED ORDER — SIMETHICONE 80 MG PO CHEW: 80.0000 mg | CHEWABLE_TABLET | ORAL | Status: DC | PRN

## 2018-01-19 MED ORDER — ONDANSETRON HCL 4 MG/2ML IJ SOLN: 4.0000 mg | INTRAMUSCULAR | Status: DC | PRN

## 2018-01-19 MED ORDER — BUPRENORPHINE HCL 8 MG SL SUBL: 4.0000 mg | SUBLINGUAL_TABLET | Freq: Every day | SUBLINGUAL | Status: DC

## 2018-01-19 MED ORDER — ONDANSETRON HCL 4 MG PO TABS: 4.0000 mg | ORAL_TABLET | ORAL | Status: DC | PRN

## 2018-01-19 NOTE — Anesthesia Postprocedure Evaluation (Signed)
Anesthesia Post Note  Patient: Marcia West  Procedure(s) Performed: AN AD HOC LABOR EPIDURAL     Patient location during evaluation: Mother Baby Anesthesia Type: Epidural Level of consciousness: awake and alert and oriented Pain management: satisfactory to patient Vital Signs Assessment: post-procedure vital signs reviewed and stable Respiratory status: spontaneous breathing and nonlabored ventilation Cardiovascular status: stable Postop Assessment: no headache, no backache, no signs of nausea or vomiting, adequate PO intake, patient able to bend at knees, no apparent nausea or vomiting and able to ambulate (patient up walking) Anesthetic complications: no    Last Vitals:  Vitals:   01/19/18 0259 01/19/18 0608  BP: 113/69 105/67  Pulse: 85 78  Resp: 16 16  Temp: 37.1 C 37.2 C  SpO2:      Last Pain:  Vitals:   01/19/18 0609  TempSrc:   PainSc: 0-No pain   Pain Goal:                 Marcia West

## 2018-01-19 NOTE — Progress Notes (Signed)
Post Partum Day 1 Subjective: Marcia West  is a 33 y.o. P7T0626 s/p SVD at [redacted]w[redacted]d.  She reports she is doing well. No acute events overnight. She denies any problems with ambulating, voiding or po intake. Denies nausea or vomiting.  Pain is well controlled on ibuprofen.  Lochia is moderate and improving.  Objective: Blood pressure 105/67, pulse 78, temperature 99 F (37.2 C), temperature source Oral, resp. rate 16, height 5\' 7"  (1.702 m), weight 74.3 kg, last menstrual period 03/29/2017, SpO2 100 %, currently breastfeeding.  Physical Exam:  General: alert, cooperative, fatigued and no distress Lochia: appropriate Uterine Fundus: firm, U-1 DVT Evaluation: No evidence of DVT seen on physical exam. Negative Homan's sign. No cords or calf tenderness. No significant calf/ankle edema.  Recent Labs    01/18/18 1945  HGB 12.7  HCT 37.7    Assessment/Plan: Plan for discharge tomorrow and Breastfeeding   LOS: 1 day   Raelyn Mora 01/19/2018, 7:57 AM

## 2018-01-19 NOTE — Lactation Note (Signed)
This note was copied from a baby's chart. Lactation Consultation Note  Patient Name: Girl Winna Volle Today's Date: 01/19/2018   G3P3 baby girl [place] now 56 hours old.  Room full of visitors.  Mom reports it is okay to talk in front of visitors. Inquired about breastfeeding history.  Inquired about current breastfeeding.  Mom reports she feels she is bf well.  Mom reports she plans to do both bf and ff.  Mom with anxiety/depression/bipolar/substance abuse.  Praised Bf.  Reviewed Yellow feeding logs, breastfeeding Resource list, and [facility] breastfeeding consultation services.  Urged mom to call lactation as needed/  Maternal Data    Feeding    LATCH Score                   Interventions    Lactation Tools Discussed/Used     Consult Status      Neomia Dear 01/19/2018, 6:06 PM

## 2018-01-20 MED ORDER — FLUOXETINE HCL 20 MG PO CAPS
40.0000 mg | ORAL_CAPSULE | Freq: Every day | ORAL | Status: AC
  Administered 2018-01-20: 40 mg via ORAL
  Filled 2018-01-20: qty 2

## 2018-01-20 MED ORDER — IBUPROFEN 600 MG PO TABS: 600.0000 mg | ORAL_TABLET | Freq: Four times a day (QID) | ORAL | 0 refills | Status: DC

## 2018-01-20 MED ORDER — BUSPIRONE HCL 5 MG PO TABS
5.0000 mg | ORAL_TABLET | Freq: Two times a day (BID) | ORAL | Status: AC
  Administered 2018-01-20: 5 mg via ORAL
  Filled 2018-01-20: qty 1

## 2018-01-20 NOTE — Discharge Instructions (Signed)

## 2018-01-20 NOTE — Discharge Summary (Addendum)
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 yo F1Q1975 at [redacted]w[redacted]d was admitted in Active Labor on 01/18/2018. Patient had an uncomplicated labor course as follows: Patient presented on 1/6 for sudden onset of labor, she wad dilated to 5cm on admission. She was started on pitocin at that time. She had SROM a few hours later, just prior to delivery. Patient had a routine delivery with no complications. Her post-delivery course was routine with no issues.  Membrane Rupture Time/Date: 11:15 PM ,01/18/2018   Intrapartum Procedures: Episiotomy: None [1]                                         Lacerations:  1st degree [2];Periurethral [8];Labial [10]  Patient had a delivery of a Viable infant. 01/18/2018  Information for the patient's newborn:  [place], [place] [[id number]]  Delivery Method: Vaginal, Spontaneous(Filed from Delivery Summary)    Pateint had an uncomplicated postpartum course.  She is ambulating, tolerating a regular diet, passing flatus, and urinating well. Patient is discharged home in stable condition on 01/20/18.   Physical exam  Vitals:   01/19/18 0608 01/19/18 1236 01/19/18 1812 01/19/18 2121  BP: 105/67 109/71 116/83 113/80  Pulse: 78 73 73 63  Resp: 16 16 16 16   Temp: 99 F (37.2  C) 98.7 F (37.1 C) 98.7 F (37.1 C) 98.4 F (36.9 C)  TempSrc: Oral  Oral   SpO2:  98% 98% 99%  Weight:      Height:       General: alert, cooperative and no distress Lochia: appropriate Uterine Fundus: firm Incision: N/A DVT Evaluation: No evidence of DVT seen on physical exam. Negative Homan's sign. No cords or calf tenderness. Labs: Lab Results  Component Value Date   WBC 8.8 01/18/2018   HGB 12.7 01/18/2018   HCT 37.7 01/18/2018   MCV 97.2 01/18/2018   PLT 181 01/18/2018   CMP Latest Ref Rng & Units 10/20/2017  Glucose 70 - 99 mg/dL 90  BUN 6 - 20 mg/dL 8  Creatinine 6.41 - 5.83 mg/dL 0.94  Sodium 076 - 808 mmol/L 133(L)  Potassium 3.5 - 5.1 mmol/L 3.7  Chloride 98 - 111 mmol/L 102  CO2 22 - 32 mmol/L 23  Calcium 8.9 - 10.3 mg/dL 1.6(X8.5(L)    Discharge instruction: per After Visit Summary and "Baby and Me Booklet".  After visit meds:  Allergies as of 01/20/2018   No Known Allergies     Medication List    STOP taking these medications   PREPLUS 27-1 MG Tabs     TAKE these medications   Buprenorphine HCl-Naloxone HCl 8-2 MG Film Commonly known as:  SUBOXONE Place 1 Film under the tongue daily. Take 1 1/4 film daily What changed:    how much to take  when to take this  additional instructions   busPIRone 5 MG tablet Commonly known as:  BUSPAR Take 1 tablet (5 mg total) by mouth 2 (two) times daily.   FLUoxetine 40 MG capsule Commonly known as:  PROZAC Take 1 capsule (40 mg total) by mouth daily.   ibuprofen 600 MG tablet Commonly known as:  ADVIL,MOTRIN Take 1 tablet (600 mg total) by mouth every 6 (six) hours.   valACYclovir 500 MG tablet Commonly known as:  VALTREX Take 1 tablet (500 mg total) by mouth 2 (two) times daily.       Diet: routine diet  Activity: Advance as tolerated. Pelvic rest for 6 weeks.   Outpatient follow up:4 weeks Follow up Appt: Future Appointments  Date Time Provider Department Center  02/22/2018  9:45 AM  Levie HeritageStinson, Jacob J, DO [facility] None   Follow up Visit:No follow-ups on file.  Postpartum contraception: Combination OCPs  Newborn Data: Live born female  Birth Weight: 6 lb 2.2 oz (2784 g) APGAR: 9, 9  Newborn Delivery   Birth date/time:  01/18/2018 23:48:00 Delivery type:  Vaginal, Spontaneous     Baby Feeding: Breast Disposition:home with mother   01/20/2018 Myrene BuddyJacob Fletcher, MD  OB FELLOW DISCHARGE ATTESTATION  I have seen and examined this patient and agree with above documentation in the resident's note.   Gwenevere AbbotNimeka Sadhana Frater, MD  OB Fellow  01/20/2018, 8:41 PM

## 2018-01-20 NOTE — Lactation Note (Signed)
This note was copied from a baby's chart. Lactation Consultation Note  Patient Name: Marcia West [id number] Date: 01/20/2018 Reason for consult: Follow-up assessment;Term P3, 27 hour female infant. Per mom infant had 7 stools and 5 wet diapers.  Mom with hx of substance use and mom on suboxone. Infant is cluster feeding and  going through withdrawal nurse discussed eat, sleep and consoled.  Per mom,  infant was breastfeeding over 2 hours LC explained that was too long to BF infant for one feeding that 30 minutes is good length of time. Mom was given coconut oil to help with breast soreness. Per mom, she  latched infant less than 40 minutes prior to  [facility] entering room. Mom latched infant on right breast using cross cradle for 6 minutes for LC to see if infant had deep latch. Infant mouth was wide, nose to breast and swallows heard . Mom hand expressed 7 ml of colostrum and given to infant by curve tip syringe. LC swaddle infant and infant went to sleep. Mom  knows to breastfeed according to hunger  cues and not go past 3 hours without breastfeeding infant Mom shown how to use DEBP & how to disassemble, clean, & reassemble parts. Mom will pump every 3 hours and give back EBM. Mom will call Nurse or LC if she has any questions, concerns or need assistance with latching infant to breast.   Maternal Data Formula Feeding for Exclusion: Yes Reason for exclusion: Mother's choice to formula and breast feed on admission Has patient been taught Hand Expression?: Yes Does the patient have breastfeeding experience prior to this delivery?: Yes  Feeding Feeding Type: Breast Fed  LATCH Score Latch: Grasps breast easily, tongue down, lips flanged, rhythmical sucking.  Audible Swallowing: Spontaneous and intermittent  Type of Nipple: Everted at rest and after stimulation  Comfort (Breast/Nipple): Soft / non-tender  Hold (Positioning): Assistance needed to correctly position infant at breast  and maintain latch.  LATCH Score: 9  Interventions Interventions: Breast feeding basics reviewed;Assisted with latch  Lactation Tools Discussed/Used Pump Review: Setup, frequency, and cleaning;Milk Storage;Other (comment) Initiated by:: Danelle Earthly, IBCLC Date initiated:: 01/20/18   Consult Status Consult Status: Follow-up Date: 01/20/18 Follow-up type: In-patient    Danelle Earthly 01/20/2018, 3:00 AM

## 2018-01-21 ENCOUNTER — Encounter: Payer: Medicaid Other | Admitting: Obstetrics & Gynecology

## 2018-01-21 ENCOUNTER — Ambulatory Visit: Payer: Self-pay

## 2018-01-21 NOTE — Lactation Note (Addendum)
This note was copied from a baby's chart. Lactation Consultation Note  Patient Name: Marcia West WNUUV'O Date: 01/21/2018 Reason for consult: Follow-up assessment;Term P3, 52 hour female infant. Mom is doing eat, sleep and consoled due mom past  hx of substance use. Per mom, she feels breastfeeding is going well  Per mom, she breastfeed infant 5 mins.  prior  to [facility] entering room for 20 minutes. LC gave infant 18 ml of EBM using slow flow bottle nipple. Mom has been pumping after breastfeeding infant and starting give back EBM afterwards. Mom will call Nurse or LC if she has any questions or concerns.   Maternal Data    Feeding Feeding Type: Breast Fed Nipple Type: Slow - flow  LATCH Score Latch: Grasps breast easily, tongue down, lips flanged, rhythmical sucking.  Audible Swallowing: A few with stimulation  Type of Nipple: Everted at rest and after stimulation  Comfort (Breast/Nipple): Soft / non-tender  Hold (Positioning): No assistance needed to correctly position infant at breast.  LATCH Score: 9  Interventions    Lactation Tools Discussed/Used     Consult Status Consult Status: Follow-up Date: 01/21/18 Follow-up type: In-patient    Danelle Earthly 01/21/2018, 3:55 AM

## 2018-01-22 NOTE — Progress Notes (Signed)
CLINICAL SOCIAL WORK MATERNAL/CHILD NOTE  Patient Details  Name: Marcia West MRN: [id number] Date of Birth: 01/18/2017  Date:  01/21/2018  Clinical Social Worker Initiating Note:  Dura Mccormack, LCSWA Date/Time: Initiated:  01/21/18/1120     Child's Name:  Marcia West   Biological Parents:  Mother, Father(Marcia West)   Need for Interpreter:  None   Reason for Referral:  Behavioral Health Concerns, Current Substance Use/Substance Use During Pregnancy    Address:  [address] [place] [address]    Phone number:  [phone] (home)     Additional phone number:   Household Members/Support Persons (HM/SP):   Household Member/Support Person 1, Household Member/Support Person 2   HM/SP Name Relationship DOB or Age  HM/SP -1 Marcia West Grandmother    HM/SP -2 Marcia West Grandfather     HM/SP -3        HM/SP -4        HM/SP -5        HM/SP -6        HM/SP -7        HM/SP -8          Natural Supports (not living in the home):  Extended Family   Professional Supports: None   Employment: Unemployed   Type of Work:     Education:  College graduate(Associates Degree)   Homebound arranged:    Financial Resources:  Medicaid   Other Resources:  Food Stamps (Plans to apply for WIC)   Cultural/Religious Considerations Which May Impact Care:    Strengths:  Home prepared for child , Pediatrician chosen, Ability to meet basic needs    Psychotropic Medications:         Pediatrician:    [place] area  Pediatrician List:   [place]    [facility] ([address])  [place]    [place]    [place]    [place]      Pediatrician Fax Number:    Risk Factors/Current Problems:  Mental Health Concerns    Cognitive State:  Able to Concentrate , Alert , Insightful , Linear Thinking    Mood/Affect:  Calm , Interested , Relaxed    CSW Assessment: CSW  met with MOB at bedside regarding consult for behavior health concerns and substance use history. MOB had a visitor present holding the infant and requested to meet with CSW outside of the room. CSW and MOB met in the surgical consult room. CSW introduced self and reason for consult. MOB reported that she recently moved to [place] from [place] to stay with her parents. MOB reported that the transition has been good and that she likes [place]. MOB reported that her two older children (Marcia West;; Marcia West) reside with their fathers in [place]. MOB reported that she plans to see them in February and that they are excited about their new little sister. CSW inquired about MOB's supports, MOB reported that her parents, sister, aunts, uncle and grandma are all local supports. MOB reported that she is currently receiving food stamps and plans to apply for WIC. CSW provided MOB with contact information to apply for WIC in [place]. MOB reported that her family is supportive and she has all items needed to care for the baby.   CSW inquired about MOB's mental health history, MOB reported that she was diagnosed with Depression and Anxiety at 33 years old.   MOB reported that she is currently prescribed/taking buspar and prozac to treat the anxiety and depression. MOB reported that her last depressive symptoms were about 2-3 weeks ago. MOB reported that she was really "down on myself" and not sleeping good. CSW inquired about MOB's coping skills. MOB reported that she plans to find a therapist and get a job. CSW positively affirmed MOB's plans and provided MOB with local mental health resources. CSW encouraged MOB to be kind to herself. CSW asked MOB about her bipolar affective disorder, MOB reported that she was diagnosed with Bipolar Affective Disorder at age 19. MOB denied any current symptoms and denied AVH. MOB reported that she had a psychotic break at age 23 and  was hospitalized. MOB reported that she has not had any other psychotic breaks or symptoms since age 23. MOB presented calm and was engaged with CSW. MOB possessed insight about her mental health history and did not demonstrate any acute mental health signs/symptoms. CSW assessed for safety, MOB denied SI, HI and domestic violence.   CSW provided education regarding the baby blues period vs. perinatal mood disorders, discussed treatment and gave resources for mental health follow up if concerns arise.  CSW recommends self-evaluation during the postpartum time period using the New Mom Checklist from Postpartum Progress and encouraged MOB to contact a medical professional if symptoms are noted at any time.    CSW provided review of Sudden Infant Death Syndrome (SIDS) precautions.   CSW informed MOB about hospital drug policy, MOB verbalized understanding. CSW asked MOB about her current medication management regimen, MOB reported that Dr. Stinson manages her suboxone and she receives it monthly. MOB reported that the suboxone has been effective and denied any drug use since starting it. MOB reported that prior to suboxone her drugs of choice were Marijuana, Cocaine and Methamphetamines. MOB reported that she did use in the beginning of the pregnancy but she was unaware that she was pregnant. MOB reported that her last use of drugs was in August of 2019. CSW asked MOB if she was interested in substance abuse treatment resources for additional support, MOB agreed and accepted resources. MOB denied any triggers or desire to use drugs currently. CSW praised MOB's sobriety. CSW encouraged MOB to follow up with treatment resources provided.   CSW identifies no further need for intervention and no barriers to discharge at this time.  CSW Plan/Description:  No Further Intervention Required/No Barriers to Discharge, Sudden Infant Death Syndrome (SIDS) Education, Perinatal Mood and Anxiety Disorder (PMADs) Education,  Hospital Drug Screen Policy Information, CSW Will Continue to Monitor Umbilical Cord Tissue Drug Screen Results and Make Report if Warranted    [name], LCSW 01/21/2018, 3:56 PM  

## 2018-01-25 MED ORDER — BUPRENORPHINE HCL-NALOXONE HCL 8-2 MG SL FILM: 1.5000 | ORAL_FILM | Freq: Every day | SUBLINGUAL | 0 refills | Status: DC

## 2018-02-10 DIAGNOSIS — F1199 Opioid use, unspecified with unspecified opioid-induced disorder: Secondary | ICD-10-CM

## 2018-02-10 DIAGNOSIS — F1190 Opioid use, unspecified, uncomplicated: Secondary | ICD-10-CM

## 2018-02-11 MED ORDER — BUPRENORPHINE HCL-NALOXONE HCL 8-2 MG SL FILM: 1.0000 | ORAL_FILM | Freq: Every day | SUBLINGUAL | 0 refills | Status: DC

## 2018-02-22 ENCOUNTER — Ambulatory Visit (INDEPENDENT_AMBULATORY_CARE_PROVIDER_SITE_OTHER): Payer: Medicaid Other | Admitting: Family Medicine

## 2018-02-22 ENCOUNTER — Encounter: Payer: Self-pay | Admitting: Family Medicine

## 2018-02-22 DIAGNOSIS — F1199 Opioid use, unspecified with unspecified opioid-induced disorder: Secondary | ICD-10-CM

## 2018-02-22 DIAGNOSIS — Z1389 Encounter for screening for other disorder: Secondary | ICD-10-CM | POA: Diagnosis not present

## 2018-02-22 DIAGNOSIS — F1190 Opioid use, unspecified, uncomplicated: Secondary | ICD-10-CM

## 2018-02-22 MED ORDER — NORETHINDRONE 0.35 MG PO TABS: 1.0000 | ORAL_TABLET | Freq: Every day | ORAL | 11 refills | Status: DC

## 2018-02-22 NOTE — Progress Notes (Signed)
Post Partum Exam  Marcia West is a 33 y.o. [phone] female who presents for a postpartum visit. She is 5 weeks postpartum following a spontaneous vaginal delivery. I have fully reviewed the prenatal and intrapartum course. The delivery was at  39 gestational weeks.  Anesthesia: epidural. Postpartum course has been uneventful. Baby's course has been uneventful. Baby is feeding by breast. Bleeding no bleeding. Bowel function is normal. Bladder function is normal. Patient is not sexually active. Contraception method is oral progesterone-only contraceptive. Postpartum depression screening:positive- score 11.  Last pap smear done 10/22/17 and was normal.  Review of Systems Pertinent items are noted in HPI.    Objective:  Last menstrual period 03/29/2017, unknown if currently breastfeeding.  General:  alert, cooperative and no distress  Lungs: clear to auscultation bilaterally  Heart:  regular rate and rhythm, S1, S2 normal, no murmur, click, rub or gallop  Abdomen: soft, non-tender; bowel sounds normal; no masses,  no organomegaly        Assessment:    Normal postpartum exam. Pap smear not done at today's visit.   Plan:   1. Contraception: oral progesterone-only contraceptive 2. Continue suboxone - referral already placed to get her transferred to IM for continuation of treatment 3. Follow up in: 1 year or as needed.

## 2018-03-04 ENCOUNTER — Telehealth: Payer: Self-pay | Admitting: *Deleted

## 2018-03-04 NOTE — Telephone Encounter (Signed)
-----   Message from Inez Catalina, MD sent at 03/03/2018  8:06 AM EST ----- Regarding: RE: NEW OUD PATIENT Please reach out and get her on the schedule if desired for next Tuesday, or when fits her schedule.  Please inform her we only have a Tuesday clinic in the AM.  thanks ----- Message ----- From: Gust Rung, DO Sent: 02/26/2018   4:53 PM EST To: Inez Catalina, MD, Tyson Alias, MD, # Subject: RE: NEW OUD PATIENT                            Will defer to emily since the clinic looks pretty full next Tuesday. ----- Message ----- From: Jackelyn Poling Sent: 02/26/2018  10:01 AM EST To: Inez Catalina, MD, Tyson Alias, MD, # Subject: NEW OUD PATIENT                                Hello,  Please review as this was sent to me for a new patient to [facility] but after speaking with the [facility] the doctor actually wants her in the OUD Clinic.  Thanks for your help with this.  Doris

## 2018-03-04 NOTE — Telephone Encounter (Signed)
Message left on patient's self-identified VM requesting return call to schedule appt for next Tuesday AM (03/09/2018). Kinnie Feil, RN, BSN

## 2018-03-05 ENCOUNTER — Telehealth: Payer: Self-pay | Admitting: *Deleted

## 2018-03-05 NOTE — Telephone Encounter (Signed)
-----   Message from Jackelyn Poling sent at 02/26/2018 10:01 AM EST ----- Regarding: NEW OUD PATIENT Hello,  Please review as this was sent to me for a new patient to [facility] but after speaking with the [facility] in [facility] the doctor actually wants her in the OUD Clinic.  Thanks for your help with this.  Doris

## 2018-03-05 NOTE — Telephone Encounter (Signed)
Thank you!  I look forward to meeting her.

## 2018-03-05 NOTE — Telephone Encounter (Signed)
I had tried to call pt, several times, finally spoke to her this am, she will come in Tuesday. She has appr 2 more weeks of med given to her at disch from birth of child No family hx of addiction No one in house is addicted She has been on suboxone for appr 38yrs Her drug of choice was vicodin/ percocet She has been in other program/s in another state She has been treated with methodone in the past but prefers suboxone She will bring baby with her appt tues 2/25 at [facility]

## 2018-03-05 NOTE — Telephone Encounter (Signed)
Done, pt had been called several times, answered today and scheduled for 2/25

## 2018-03-05 NOTE — Telephone Encounter (Signed)
-----   Message from Jackelyn Poling sent at 03/05/2018  1:10 PM EST ----- Regarding: RE: NEW OUD PATIENT Hello,  Has a decision been made about this patient?  Provider's office want to know ASAP.  Doris ----- Message ----- From: Gust Rung, DO Sent: 02/26/2018   4:53 PM EST To: Inez Catalina, MD, Tyson Alias, MD, # Subject: RE: NEW OUD PATIENT                            Will defer to emily since the clinic looks pretty full next Tuesday. ----- Message ----- From: Jackelyn Poling Sent: 02/26/2018  10:01 AM EST To: Inez Catalina, MD, Tyson Alias, MD, # Subject: NEW OUD PATIENT                                Hello,  Please review as this was sent to me for a new patient to [facility] but after speaking with the [facility] in [facility] the doctor actually wants her in the OUD Clinic.  Thanks for your help with this.  Doris

## 2018-03-15 MED ORDER — BUPRENORPHINE HCL-NALOXONE HCL 8-2 MG SL FILM: 1.0000 | ORAL_FILM | Freq: Every day | SUBLINGUAL | 0 refills | Status: DC

## 2018-03-16 DIAGNOSIS — F331 Major depressive disorder, recurrent, moderate: Secondary | ICD-10-CM | POA: Diagnosis not present

## 2018-03-25 ENCOUNTER — Telehealth: Payer: Self-pay

## 2018-03-25 NOTE — Telephone Encounter (Signed)
Needs to speak with a nurse about getting oud appt

## 2018-03-25 NOTE — Telephone Encounter (Signed)
appt rescheduled.

## 2018-03-30 ENCOUNTER — Ambulatory Visit (INDEPENDENT_AMBULATORY_CARE_PROVIDER_SITE_OTHER): Payer: Medicaid Other | Admitting: Internal Medicine

## 2018-03-30 ENCOUNTER — Other Ambulatory Visit: Payer: Self-pay

## 2018-03-30 VITALS — BP 114/74 | HR 76 | Temp 98.0°F | Ht 66.0 in | Wt 146.7 lb

## 2018-03-30 DIAGNOSIS — Z79899 Other long term (current) drug therapy: Secondary | ICD-10-CM

## 2018-03-30 DIAGNOSIS — F419 Anxiety disorder, unspecified: Secondary | ICD-10-CM

## 2018-03-30 DIAGNOSIS — F1199 Opioid use, unspecified with unspecified opioid-induced disorder: Secondary | ICD-10-CM

## 2018-03-30 DIAGNOSIS — F1120 Opioid dependence, uncomplicated: Secondary | ICD-10-CM

## 2018-03-30 DIAGNOSIS — F1190 Opioid use, unspecified, uncomplicated: Secondary | ICD-10-CM

## 2018-03-30 DIAGNOSIS — F329 Major depressive disorder, single episode, unspecified: Secondary | ICD-10-CM | POA: Diagnosis not present

## 2018-03-30 MED ORDER — BUPRENORPHINE HCL-NALOXONE HCL 8-2 MG SL FILM: ORAL_FILM | SUBLINGUAL | 0 refills | Status: DC

## 2018-03-30 NOTE — Assessment & Plan Note (Signed)
Patient is a 33 year old with opioid use disorder, depression, and anxiety. She started using oxycodone around the age of 78 after breaking her leg. She continued opioid therapy until approximately eight years ago when she became pregnant with her first son. She then started buying Suboxone off the street and self-medicating. She continued Suboxone until she became pregnant with her second son and then was switched to methadone. Shortly after his birth she switched back to Suboxone and has been on it for the past six years. She moved to [place] in October 2019 from [place], which is where she spent the majority of her life. She states that she moved to remove herself from her social circumstances. She has not used any illicit substances since moving to [place]. She has been on 4 mg of Suboxone daily since establishing with OB in [place]. She is not experiencing any side effects of the Suboxone. Currently she does not have custody of her two sons but does have custody of her two-month-old daughter. She is currently breast and bottlefeeding.  In regards to her depression and anxiety she is currently trying to establish with a psychiatrist. She is currently on fluoxetine 40 mg and BuSpar 5 mg BID.  Review of records indicates that the patient had HIV testing in October 2019 that was negative. She does not have any documented hepatitis C testing since 2013. We do not have any hepatic function testing on file. Today we will obtain hepatitis C and CMP. We will initiate Suboxone 4mg  once daily and the patient will follow-up in two weeks.

## 2018-03-30 NOTE — Progress Notes (Signed)
03/30/2018  Marcia West presents for buprenorphine/naloxone intake visit.   I have reviewed EPIC data including labwork which was available.  I have reviewed outside records provided by patient if available.  The salient points were confirmed with the patient.    Review of substance use history (first use, substances used, any illicit purchases):  - Oxycodone, started at age 33 - Cocaine, nasal route, last used in September 2019  - Methamphetamines, nasal route, last used September 2019   Last substance used:  - Cocaine, nasal route, last used in September 2019  - Methamphetamines, nasal route, last used September 2019   If last substance not an opioid, last opioid used (type, dose, route, withdrawal symptoms): Listed above.   Mental Health History: Depression and Anxiety, currently on therapy looking to establish with a psychiatrist.   Current counseling/behavioural health provider: Looking to establish with a psychiatrist.  Past Medical History:  Diagnosis Date  . Anxiety   . Bipolar affective (HCC)   . Depression   . Herpes simplex type II infection   . Substance abuse (HCC)    Current Outpatient Medications on File Prior to Visit  Medication Sig Dispense Refill  . Buprenorphine HCl-Naloxone HCl (SUBOXONE) 8-2 MG FILM Place 1 Film under the tongue daily for 14 days. 14 each 0  . busPIRone (BUSPAR) 5 MG tablet Take 1 tablet (5 mg total) by mouth 2 (two) times daily. 60 tablet 3  . FLUoxetine (PROZAC) 40 MG capsule Take 1 capsule (40 mg total) by mouth daily. 30 capsule 3  . norethindrone (MICRONOR,CAMILA,ERRIN) 0.35 MG tablet Take 1 tablet (0.35 mg total) by mouth daily. 1 Package 11   No current facility-administered medications on file prior to visit.    Family History  Problem Relation Age of Onset  . Arthritis Maternal Grandmother   . Diabetes Maternal Grandmother   . Diabetes Maternal Grandfather   . Obesity Paternal Grandmother    Past Surgical History:   Procedure Laterality Date  . NO PAST SURGERIES    . WISDOM TOOTH EXTRACTION     Physical Exam  Vitals:   03/30/18 1109  BP: 114/74  Pulse: 76  Temp: 98 F (36.7 C)  TempSrc: Oral  SpO2: 98%  Weight: 146 lb 11.2 oz (66.5 kg)  Height: 5\' 6"  (1.676 m)   Clinical Opiate Withdrawal Scale: bold applicable COWS scoring   - Resting HR:    - 0 for < 80   - 1 for 81 - 100   - 2 for 101 - 120   - 4 for > 120  - Sweating:   - 0 for no chills/flushing   - 1 for subjective chills/flushing   - 3 for beads of sweat on brow/face   - 4 for sweat streaming off of face  - Restlessness:    - 0 for able to sit still   - 1 for subjective difficulty sitting still   - 3 for frequent shifting or extraneous movement   - 5 for unable to sit still for more than a few seconds  - Pupil size:    - 0 for pinpoint or normal   - 1 for possibly larger than normal   - 2 for moderately dilated   - 5 for only iris rim visible  - Bone/joint pain:    - 0 for not present   - 1 for mild diffuse discomfort   - 2 severe diffuse aching   - 4 for objectively rubbing joints/muscles  and obviously in pain  - Runny nose/tearing:    - 0 for not present   - 1 for stuffy nose/moist eyes   - 2 for nose running/tearing   - 4 for nose constantly running or tears streaming down cheeks  - GI Upset:    - 0 for no GI symptoms   - 1 for stomach cramps   - 2 for nausea or loose stool   - 3 for vomiting or diarrhea   - 5 for multiple episodes of vomiting or diarrhea  - Tremor observation of outstretched hands:    - 0 for no tremor   - 1 for tremor can be felt but not observed   - 2 for slight tremor observable   - 4 for gross tremor or muscle twitching  - Yawning:    - 0 for no yawning   - 1 for yawning once or twice during assessment   - 2 for yawning three or more times during assessment   - 4 for yawning several times per minute  - Anxiety or irritability:    - 0 for none   - 1 for patient reports increasing  irritability or anxiousness   - 2 for patient obviously irritable/anxious   - 4 for patient so irritable/anxious that assessment is difficult  - Gooseflesh:    - 0 for skin is smooth   - 3 for piloerection of skin can be felt or seen   - 5 for prominent piloerection  TOTAL: 0  Assessment/Plan:   Based on a review of the patient's medical history including substance use and mental health factors, and physical exam, Marcia West is a suitable candidate for MAT with buprenorphine/naloxone.  UDS ordered this visit.    I have discussed HIV and Hepatitis C screening with this patient.    I will place orders for CMET for baseline LFT evaluation if needed.    Home Induction:   I have instructed the patient how to appropriately take this medication, including placing under the tongue with head relaxed for 10 minutes and allowing to dissolve without chewing or swallowing tab/film, and with nothing to eat or drink in the subsequent 15 minutes.  They have been told not to start taking the medication until they have significant signs of withdrawal and I have explained the concept of precipitated withdrawal with the patient.    Intervisit Care:  We discussed this medication must be kept in a safe place and away from children.   We will see the patient back in 1 week in clinic, with options for a sooner appointment based on patient and provider preference.   Patient was encouraged to call the office and speak with the MD on call for any urgent concerns.    Levora Dredge, MD 03/30/2018 11:18 AM

## 2018-03-30 NOTE — Patient Instructions (Signed)
Thank you for allowing [place] to provide your care. We have sent out your prescription. Please come back to see [place] in 2 weeks.

## 2018-03-30 NOTE — Progress Notes (Signed)
Internal Medicine Clinic Attending  I saw and evaluated the patient.  I personally confirmed the key portions of the history and exam documented by Dr. Caron Presume and I reviewed pertinent patient test results.  The assessment, diagnosis, and plan were formulated together and I agree with the documentation in the resident's note.  Well controled moderate opioid use disorder. She is going through a long taper of suboxone with goal to discontinue in a few months. I advised we would help her through that process. Continue with 4mg  daily. I prescribed 2 week supply. We will talk by phone if covid restrictions remain in place at follow up.

## 2018-03-31 LAB — CMP14 + ANION GAP
ALT: 12 IU/L (ref 0–32)
AST: 18 IU/L (ref 0–40)
Albumin/Globulin Ratio: 1.8 (ref 1.2–2.2)
Albumin: 4.9 g/dL — ABNORMAL HIGH (ref 3.8–4.8)
Alkaline Phosphatase: 70 IU/L (ref 39–117)
Anion Gap: 17 mmol/L (ref 10.0–18.0)
BUN/Creatinine Ratio: 13 (ref 9–23)
BUN: 11 mg/dL (ref 6–20)
Bilirubin Total: 0.4 mg/dL (ref 0.0–1.2)
CO2: 22 mmol/L (ref 20–29)
Calcium: 10 mg/dL (ref 8.7–10.2)
Chloride: 100 mmol/L (ref 96–106)
Creatinine, Ser: 0.87 mg/dL (ref 0.57–1.00)
GFR calc Af Amer: 103 mL/min/{1.73_m2} (ref >59)
GFR calc non Af Amer: 89 mL/min/{1.73_m2} (ref >59)
Globulin, Total: 2.7 g/dL (ref 1.5–4.5)
Glucose: 87 mg/dL (ref 65–99)
Potassium: 5.2 mmol/L (ref 3.5–5.2)
Sodium: 139 mmol/L (ref 134–144)
Total Protein: 7.6 g/dL (ref 6.0–8.5)

## 2018-03-31 LAB — HEPATITIS C ANTIBODY: Hep C Virus Ab: <0.1 s/co ratio (ref 0.0–0.9)

## 2018-04-02 ENCOUNTER — Encounter: Payer: Self-pay | Admitting: Internal Medicine

## 2018-04-02 LAB — TOXASSURE SELECT,+ANTIDEPR,UR

## 2018-04-06 ENCOUNTER — Other Ambulatory Visit: Payer: Self-pay | Admitting: Internal Medicine

## 2018-04-06 DIAGNOSIS — F329 Major depressive disorder, single episode, unspecified: Secondary | ICD-10-CM

## 2018-04-06 DIAGNOSIS — F419 Anxiety disorder, unspecified: Secondary | ICD-10-CM

## 2018-04-06 MED ORDER — FLUOXETINE HCL 60 MG PO TABS: 60.0000 mg | ORAL_TABLET | Freq: Every day | ORAL | 3 refills | Status: DC

## 2018-04-12 ENCOUNTER — Telehealth: Payer: Self-pay | Admitting: Student in an Organized Health Care Education/Training Program

## 2018-04-12 NOTE — Telephone Encounter (Signed)
I left a voicemail for the patient to return my call to the clinic.  I want to do a telephone visit to follow-up with her opioid use disorder.  She should be due for refill of her Suboxone tomorrow.

## 2018-04-20 ENCOUNTER — Encounter: Payer: Self-pay | Admitting: Internal Medicine

## 2018-04-21 ENCOUNTER — Encounter: Payer: Self-pay | Admitting: Internal Medicine

## 2018-04-22 ENCOUNTER — Other Ambulatory Visit: Payer: Self-pay | Admitting: Internal Medicine

## 2018-04-22 ENCOUNTER — Telehealth: Payer: Self-pay | Admitting: *Deleted

## 2018-04-22 ENCOUNTER — Other Ambulatory Visit: Payer: Self-pay | Admitting: Family Medicine

## 2018-04-22 MED ORDER — BUPRENORPHINE HCL-NALOXONE HCL 8-2 MG SL FILM: ORAL_FILM | SUBLINGUAL | 0 refills | Status: DC

## 2018-04-22 NOTE — Progress Notes (Signed)
I have been corresponding with Nastassja on My Chart by her request.  Awaiting to hear more information about her medications prior to any change in therapy.   Buprenorphine refilled for 30 day supply.  We will get her in to be seen when clinic opens to a normal schedule again.   Debe Coder, MD

## 2018-04-22 NOTE — Telephone Encounter (Signed)
Opened in error

## 2018-04-26 ENCOUNTER — Other Ambulatory Visit: Payer: Self-pay | Admitting: Internal Medicine

## 2018-04-26 MED ORDER — BUSPIRONE HCL 10 MG PO TABS: 10.0000 mg | ORAL_TABLET | Freq: Three times a day (TID) | ORAL | 1 refills | Status: DC

## 2018-04-26 MED ORDER — CLONAZEPAM 0.5 MG PO TABS: 0.5000 mg | ORAL_TABLET | Freq: Two times a day (BID) | ORAL | 0 refills | Status: DC | PRN

## 2018-05-08 ENCOUNTER — Encounter: Payer: Self-pay | Admitting: Internal Medicine

## 2018-05-13 ENCOUNTER — Other Ambulatory Visit: Payer: Self-pay | Admitting: Internal Medicine

## 2018-05-13 MED ORDER — CLONAZEPAM 0.5 MG PO TABS: 0.5000 mg | ORAL_TABLET | Freq: Two times a day (BID) | ORAL | 0 refills | Status: DC | PRN

## 2018-06-07 ENCOUNTER — Encounter: Payer: Self-pay | Admitting: Internal Medicine

## 2018-06-10 MED ORDER — CLONAZEPAM 0.5 MG PO TABS: 0.5000 mg | ORAL_TABLET | Freq: Two times a day (BID) | ORAL | 0 refills | Status: DC | PRN

## 2018-06-10 MED ORDER — BUPRENORPHINE HCL-NALOXONE HCL 8-2 MG SL FILM: ORAL_FILM | SUBLINGUAL | 0 refills | Status: DC

## 2018-06-29 ENCOUNTER — Encounter: Payer: Self-pay | Admitting: Internal Medicine

## 2018-06-29 ENCOUNTER — Ambulatory Visit (INDEPENDENT_AMBULATORY_CARE_PROVIDER_SITE_OTHER): Payer: Medicaid Other | Admitting: Internal Medicine

## 2018-06-29 ENCOUNTER — Other Ambulatory Visit: Payer: Self-pay

## 2018-06-29 VITALS — BP 102/65 | HR 76 | Temp 98.5°F | Ht 66.5 in | Wt 147.4 lb

## 2018-06-29 DIAGNOSIS — F329 Major depressive disorder, single episode, unspecified: Secondary | ICD-10-CM

## 2018-06-29 DIAGNOSIS — F419 Anxiety disorder, unspecified: Secondary | ICD-10-CM | POA: Diagnosis not present

## 2018-06-29 DIAGNOSIS — F1190 Opioid use, unspecified, uncomplicated: Secondary | ICD-10-CM

## 2018-06-29 DIAGNOSIS — F1120 Opioid dependence, uncomplicated: Secondary | ICD-10-CM | POA: Diagnosis not present

## 2018-06-29 DIAGNOSIS — Z79899 Other long term (current) drug therapy: Secondary | ICD-10-CM

## 2018-06-29 DIAGNOSIS — F1199 Opioid use, unspecified with unspecified opioid-induced disorder: Secondary | ICD-10-CM

## 2018-06-29 MED ORDER — BUPRENORPHINE HCL-NALOXONE HCL 4-1 MG SL FILM: ORAL_FILM | SUBLINGUAL | 0 refills | Status: DC

## 2018-06-29 NOTE — Assessment & Plan Note (Addendum)
Doing well on Suboxone 2.5 mg total daily dose and requesting to titrate down to the 4-2 mg films. Last UDS reviewed and appropriate. Database also appropriate. Will decrease Suboxone to 4-2 mg (1 film will last 3 days for a TDD of ~1mg ), #15 films with no RF and follow up in 4 weeks.

## 2018-06-29 NOTE — Patient Instructions (Signed)
Ms. Boyson,   We sent a prescription for Suboxone 4-2 to your pharmacy. Continue taking the rest of your medications as usual. Follow up in 4 weeks.  Call us if you have any questions or concerns.  - Dr. Frederico Hamman

## 2018-06-29 NOTE — Addendum Note (Signed)
Addended by: Gilles Chiquito B on: 06/29/2018 01:39 PM   Modules accepted: Orders

## 2018-06-29 NOTE — Assessment & Plan Note (Signed)
Doing well on Fluoxetine, Buspar, and Klonopin 0.5 mg BID PRN. States she has not had any symptoms concerning for depression and feels much more functional since starting Klonopin for anxiety. Will continue current regimen at same doses. Database reviewed and appropriate. No refill for Klonopin as it was recently refilled in 5/28.

## 2018-06-29 NOTE — Progress Notes (Signed)
Internal Medicine Clinic Attending  I saw and evaluated the patient.  I personally confirmed the key portions of the history and exam documented by Dr. Santos-Sanchez and I reviewed pertinent patient test results.  The assessment, diagnosis, and plan were formulated together and I agree with the documentation in the resident's note. 

## 2018-06-29 NOTE — Progress Notes (Signed)
   06/29/2018  Marcia West presents for follow up of opioid use disorder I have reviewed the prior induction visit, follow up visits, and telephone encounters relevant to opiate use disorder (OUD) treatment.   Current daily dose: Suboxone 8-2 mg (2.5 mg total daily dose, 1 film lasts 3 days)  Date of Induction: 03/30/2018  Current follow up interval, in weeks: 4 weeks   The patient has been adherent with the buprenorphine for OUD contract.   Last UDS Result: 03/30/2018, appropriate   HPI: Marcia West presents for OUD follow up. Overall she sis doing well and has no complaints today. Has been staying at home taking care of her 67-month old daughter. She has been splittingher Suboxone 8-2 films in 3 (2.5 mg total daily dose) and doing well. No cravings or relapses. She would like to titrate her dose down and asking for 4-2 films. She also feels well on Buspar and Prozac at current doses and does not endorse any symptoms of depression. She is on Klonopin 0.5 mg BID for management of anxiety and states this has saved her life. States she feels like a functional person since starting this.   Exam:   Vitals:   06/29/18 1010  BP: 102/65  Pulse: 76  Temp: 98.5 F (36.9 C)  TempSrc: Oral  SpO2: 100%  Weight: 147 lb 6.4 oz (66.9 kg)  Height: 5' 6.5" (1.689 m)   General: well appearing female in no acute distress CV: RRR, no mrg  Pulm: CTAB, nl work of breathing  Ext: warm and well perfused without edema   Assessment/Plan:  See Problem Based Charting in the Encounters Tab   [name], MD  06/29/2018  10:32 AM

## 2018-07-08 ENCOUNTER — Other Ambulatory Visit: Payer: Self-pay | Admitting: Internal Medicine

## 2018-07-27 ENCOUNTER — Ambulatory Visit (INDEPENDENT_AMBULATORY_CARE_PROVIDER_SITE_OTHER): Payer: Medicaid Other | Admitting: Internal Medicine

## 2018-07-27 ENCOUNTER — Encounter: Payer: Self-pay | Admitting: Internal Medicine

## 2018-07-27 ENCOUNTER — Other Ambulatory Visit: Payer: Self-pay

## 2018-07-27 VITALS — BP 118/79 | HR 71 | Temp 98.0°F | Wt 145.6 lb

## 2018-07-27 DIAGNOSIS — F419 Anxiety disorder, unspecified: Secondary | ICD-10-CM

## 2018-07-27 DIAGNOSIS — F329 Major depressive disorder, single episode, unspecified: Secondary | ICD-10-CM

## 2018-07-27 DIAGNOSIS — F1190 Opioid use, unspecified, uncomplicated: Secondary | ICD-10-CM

## 2018-07-27 DIAGNOSIS — F1120 Opioid dependence, uncomplicated: Secondary | ICD-10-CM

## 2018-07-27 DIAGNOSIS — F1199 Opioid use, unspecified with unspecified opioid-induced disorder: Secondary | ICD-10-CM

## 2018-07-27 NOTE — Assessment & Plan Note (Signed)
Doing well managing Suboxone taper. Currently on 2 mg total daily dose using 4-2 mg films. Database reviewed and appropriate. She recently picked up new 4-2 mg prescription which she thinks will last her 3-4 weeks. Plan for telehealth visit when she needs a refill.

## 2018-07-27 NOTE — Patient Instructions (Signed)
Marcia West,  It was great meeting you. I am glad you are doing well with your Suboxone taper.   We will schedule a follow-up telehealth visit in 3 weeks when it is time for a refill on your Suboxone.   Take care! Dr. Koleen Distance

## 2018-07-27 NOTE — Progress Notes (Signed)
   07/27/2018  Marcia West presents for follow up of opioid use disorder I have reviewed the prior induction visit, follow up visits, and telephone encounters relevant to opiate use disorder (OUD) treatment.   Current daily dose:  Suboxone 4-2 mg (2 mg total daily dose, 1 film lasts 2 days)   Date of Induction: 03/30/18  Current follow up interval, in weeks: 4  The patient has been adherent with the buprenorphine for OUD contract.   Last UDS Result: appropriate for Suboxone, fluoxetine   HPI: Marcia West is a 33 y/o woman who presents for OUD follow-up. She is doing well managing her suboxone taper. Denies any increased cravings or withdrawals. She is currently talk half of 4-2 mg film daily and plan to cut back to a third of a film next week.  She is also doing well on her current regimen for anxiety and depression.   Exam:   Vitals:   07/27/18 1047  BP: 118/79  Pulse: 71  Temp: 98 F (36.7 C)  TempSrc: Oral  SpO2: 99%  Weight: 145 lb 9.6 oz (66 kg)    General: well-appearing, pleasant female, in NAD CV: RRR; no m/r/g Pulm: normal work of breathing; lungs CTAB Ext: No edema Psych: in good spirits; normal mood and affect   Assessment/Plan:  See Problem Based Charting in the Encounters Tab   Delice Bison, DO  07/27/2018  10:53 AM

## 2018-07-27 NOTE — Progress Notes (Signed)
Internal Medicine Clinic Attending  Case discussed with Dr. Bloomfield at the time of the visit.  We reviewed the resident's history and exam and pertinent patient test results.  I agree with the assessment, diagnosis, and plan of care documented in the resident's note.  

## 2018-07-30 LAB — TOXASSURE SELECT,+ANTIDEPR,UR

## 2018-08-09 ENCOUNTER — Other Ambulatory Visit: Payer: Self-pay | Admitting: Internal Medicine

## 2018-08-17 ENCOUNTER — Other Ambulatory Visit: Payer: Self-pay

## 2018-08-17 ENCOUNTER — Telehealth: Payer: Self-pay | Admitting: General Practice

## 2018-08-17 ENCOUNTER — Encounter: Payer: Self-pay | Admitting: Internal Medicine

## 2018-08-17 ENCOUNTER — Ambulatory Visit (INDEPENDENT_AMBULATORY_CARE_PROVIDER_SITE_OTHER): Payer: Medicaid Other | Admitting: Student in an Organized Health Care Education/Training Program

## 2018-08-17 DIAGNOSIS — F1120 Opioid dependence, uncomplicated: Secondary | ICD-10-CM

## 2018-08-17 DIAGNOSIS — F1190 Opioid use, unspecified, uncomplicated: Secondary | ICD-10-CM

## 2018-08-17 DIAGNOSIS — F1199 Opioid use, unspecified with unspecified opioid-induced disorder: Secondary | ICD-10-CM

## 2018-08-17 MED ORDER — BUPRENORPHINE HCL-NALOXONE HCL 4-1 MG SL FILM: ORAL_FILM | SUBLINGUAL | 0 refills | Status: DC

## 2018-08-17 NOTE — Telephone Encounter (Signed)
Pt missed call from Dr Evette Doffing [phone]

## 2018-08-17 NOTE — Progress Notes (Signed)
  [facility] Internal Medicine Residency Telephone Encounter Continuity Care Appointment  HPI:   This telephone encounter was created for [name] on 08/17/2018 for the following purpose/cc medication assisted therapy of OUD.  Patient reports doing well at home. She has a 7 month baby who is healthy and crawling. The stress has been manageable. She is trying to wean herself off the suboxone as she does not want to continue with MAT indefinitely. She successfully moved from 4mg  daily to 2mg  daily for about 3 weeks. When she moved to 1mg  daily she noticed watery eyes, nausea, irritability consistent with mild withdrawal. So she went back to 2mg  daily and those symptoms resolved. She has had no cravings in several years and no relapses. She reports no chronic pain generators right now. No recent illness, no fevers, no cough.     Past Medical History:  Past Medical History:  Diagnosis Date  . Anxiety   . Bipolar affective ([place])   . Depression   . Herpes simplex type II infection   . Substance abuse ([place])       ROS:      Assessment / Plan / Recommendations:   Please see A&P under problem oriented charting for assessment of the patient's acute and chronic medical conditions.   As always, pt is advised that if symptoms worsen or new symptoms arise, they should go to an urgent care facility or to to ER for further evaluation.   Consent and Medical Decision Making:   This is a telephone encounter between [employer] and Axel Filler on 08/17/2018 for OUD. The visit was conducted with the patient located at home and Axel Filler at [facility]. The patient's identity was confirmed using their DOB and current address. The patient has consented to being evaluated through a telephone encounter and understands the associated risks (an examination cannot be done and the patient may need to come in for an appointment) / benefits (allows the patient to remain at home, decreasing  exposure to coronavirus). I personally spent 6 minutes on medical discussion.

## 2018-08-17 NOTE — Assessment & Plan Note (Signed)
Stable and well controlled. She is currently using only 2mg  per day in her attempt to slowly wean off the suboxone. However she did have mild withdrawal symptoms when down to 1mg  daily. She is going to stick with 2mg  daily for another few weeks, then move to 1/3 of a film, which is about 1.5mg  as the next step in the taper. She has good understanding of the dosing plan and the film is large enough that she can cut these with good accuracy. Will have her follow up with [place] in 4 weeks in person visit to check on her progress.

## 2018-08-27 DIAGNOSIS — Z20828 Contact with and (suspected) exposure to other viral communicable diseases: Secondary | ICD-10-CM | POA: Diagnosis not present

## 2018-09-12 ENCOUNTER — Other Ambulatory Visit: Payer: Self-pay | Admitting: Internal Medicine

## 2018-09-18 ENCOUNTER — Encounter: Payer: Self-pay | Admitting: Internal Medicine

## 2018-09-23 ENCOUNTER — Encounter: Payer: Self-pay | Admitting: Student in an Organized Health Care Education/Training Program

## 2018-09-24 ENCOUNTER — Encounter: Payer: Self-pay | Admitting: Internal Medicine

## 2018-09-24 MED ORDER — BUPRENORPHINE HCL-NALOXONE HCL 4-1 MG SL FILM: ORAL_FILM | SUBLINGUAL | 0 refills | Status: DC

## 2018-10-06 DIAGNOSIS — R509 Fever, unspecified: Secondary | ICD-10-CM | POA: Diagnosis not present

## 2018-10-06 DIAGNOSIS — R51 Headache: Secondary | ICD-10-CM | POA: Diagnosis not present

## 2018-10-06 DIAGNOSIS — Z20828 Contact with and (suspected) exposure to other viral communicable diseases: Secondary | ICD-10-CM | POA: Diagnosis not present

## 2018-10-07 ENCOUNTER — Other Ambulatory Visit: Payer: Self-pay | Admitting: Internal Medicine

## 2018-11-08 ENCOUNTER — Encounter: Payer: Self-pay | Admitting: Internal Medicine

## 2018-11-08 NOTE — Telephone Encounter (Signed)
Returned call to patient. Explained that [facility] is full tomorrow, however, we can see her in [facility] at 1015 and that Provider would be able to consult with Dr. Evette Doffing. Also, explained IBH. She is very interested in meeting with Miquel Dunn. [place]'s schedule is booked tomorrow, but hopefully, she may have a few minutes to connect with patient. Patient is very Patent attorney. Hubbard Hartshorn, BSN, RN-BC

## 2018-11-09 ENCOUNTER — Ambulatory Visit (INDEPENDENT_AMBULATORY_CARE_PROVIDER_SITE_OTHER): Payer: Medicaid Other | Admitting: Internal Medicine

## 2018-11-09 ENCOUNTER — Other Ambulatory Visit: Payer: Self-pay

## 2018-11-09 VITALS — BP 125/89 | HR 75 | Temp 98.3°F | Ht 66.5 in | Wt 144.7 lb

## 2018-11-09 DIAGNOSIS — Z79899 Other long term (current) drug therapy: Secondary | ICD-10-CM | POA: Diagnosis not present

## 2018-11-09 DIAGNOSIS — F419 Anxiety disorder, unspecified: Secondary | ICD-10-CM | POA: Diagnosis not present

## 2018-11-09 DIAGNOSIS — F1199 Opioid use, unspecified with unspecified opioid-induced disorder: Secondary | ICD-10-CM

## 2018-11-09 DIAGNOSIS — F329 Major depressive disorder, single episode, unspecified: Secondary | ICD-10-CM

## 2018-11-09 DIAGNOSIS — F1120 Opioid dependence, uncomplicated: Secondary | ICD-10-CM | POA: Diagnosis not present

## 2018-11-09 DIAGNOSIS — F1190 Opioid use, unspecified, uncomplicated: Secondary | ICD-10-CM

## 2018-11-09 MED ORDER — BUPRENORPHINE HCL-NALOXONE HCL 4-1 MG SL FILM: ORAL_FILM | SUBLINGUAL | 0 refills | Status: DC

## 2018-11-09 NOTE — Assessment & Plan Note (Signed)
She had been on 2mg  Daily and has attempted to wean at home since her last visit. She has had increasing depression and anxiety in that time. She also notes some mild withdrawal symptoms starting today (day 3 of being off suboxone entirely).  Given she has had worsening of her anxiety and depression and it is unclear to what extent this can be attributed to her taper and her withdrawal symptoms off suboxone; we will start her back on a steady dose of suboxone while she works on her behavioral health symptoms and we can consider another taper attempt in the future. - Suboxone 4-1mg , 2mg  (1/2 film) Daily - Follow up in 4 weeks for tele-health visit

## 2018-11-09 NOTE — Progress Notes (Signed)
   11/09/2018  Marcia West presents for follow up of opioid use disorder I have reviewed the prior induction visit, follow up visits, and telephone encounters relevant to opiate use disorder (OUD) treatment.   Current daily dose: Suboxone 4-2 mg (2 mg total daily dose)  Date of Induction: 03/30/2018  Current follow up interval, in weeks: 4  The patient has been adherent with the buprenorphine for OUD contract.   Last UDS Result: 07/27/2018  HPI: 33 yo F presenting for OUD follow up. Since she was last seen she has attempted to taper off of suboxone. She decreased her dose from 1/2 to 1/3 to 1/4 to 1/8 of a film, taking each dose for about 4 days. She has not taken any suboxone for the past 2 days as she was unsure if she would get an appointment. She state she has started to notice some withdrawal symptoms today.  She has been experiencing worsening symptoms of depression and anxiety. Her son in [place] tested positive for Covid and her other son was told he can't go to daycare for two weeks because of exposure. She denies SI. She states that she does not feel her symptoms are entirely related to her cravings and in interested in [facility] and likely psychiatry referral  Exam:   Vitals:   11/09/18 1037  BP: 125/89  Pulse: 75  Temp: 98.3 F (36.8 C)  TempSrc: Oral  SpO2: 99%  Weight: 144 lb 11.2 oz (65.6 kg)  Height: 5' 6.5" (1.689 m)   Physical Exam Constitutional:      General: She is not in acute distress.    Appearance: Normal appearance.  Cardiovascular:     Rate and Rhythm: Normal rate and regular rhythm.     Pulses: Normal pulses.     Heart sounds: Normal heart sounds.  Pulmonary:     Effort: Pulmonary effort is normal. No respiratory distress.     Breath sounds: Normal breath sounds.  Abdominal:     General: Bowel sounds are normal. There is no distension.     Palpations: Abdomen is soft.     Tenderness: There is no abdominal tenderness.  Musculoskeletal:      General: No swelling or deformity.  Skin:    General: Skin is warm and dry.  Neurological:     General: No focal deficit present.     Mental Status: Mental status is at baseline.    Assessment/Plan:  See Problem Based Charting in the Encounters Tab  Neva Seat, MD  11/09/2018  11:10 AM

## 2018-11-09 NOTE — Patient Instructions (Addendum)
Thank you for allowing [place] to care for you  You have been referred to our counselor who can also assist you with finding a psychiatrist. - Continue current medications for anxiety and depression  You have been started back on 2mg  Daily (1/2 of a film) daily.  You are to follow up by phone in about 4 weeks

## 2018-11-09 NOTE — Assessment & Plan Note (Signed)
She has been experiencing worsening symptoms of depression and anxiety. Her son in [place] tested positive for Covid and her other son was told he can't go to daycare for two weeks because of exposure. She denies SI. She states that she does not feel her symptoms are entirely related to her cravings (due to her attempt at tapering suboxone) and in interested in [facility] and likely psychiatry referral. - Referral to [facility], Psychiatry referral will be need if she needs further medication adjustment - Fluoxetine 60mg  Daily - Buspar 10mg  TID - Clonazepam 0.5mg  PRN

## 2018-11-09 NOTE — Telephone Encounter (Signed)
Ok. If we get a cancellation or are run ahead of schedule, we can evaluate her in the [place] clinic.

## 2018-11-09 NOTE — Progress Notes (Signed)
Internal Medicine Clinic Attending  I saw and evaluated the patient.  I personally confirmed the key portions of the history and exam documented by Dr. Melvin and I reviewed pertinent patient test results.  The assessment, diagnosis, and plan were formulated together and I agree with the documentation in the resident's note.  

## 2018-11-10 DIAGNOSIS — Z23 Encounter for immunization: Secondary | ICD-10-CM | POA: Diagnosis not present

## 2018-11-11 DIAGNOSIS — Z20828 Contact with and (suspected) exposure to other viral communicable diseases: Secondary | ICD-10-CM | POA: Diagnosis not present

## 2018-11-11 DIAGNOSIS — R430 Anosmia: Secondary | ICD-10-CM | POA: Diagnosis not present

## 2018-11-11 DIAGNOSIS — R070 Pain in throat: Secondary | ICD-10-CM | POA: Diagnosis not present

## 2018-11-11 DIAGNOSIS — R438 Other disturbances of smell and taste: Secondary | ICD-10-CM | POA: Diagnosis not present

## 2018-11-11 DIAGNOSIS — R519 Headache, unspecified: Secondary | ICD-10-CM | POA: Diagnosis not present

## 2018-11-17 ENCOUNTER — Encounter: Payer: Self-pay | Admitting: Licensed Clinical Social Worker

## 2018-11-17 ENCOUNTER — Telehealth: Payer: Self-pay | Admitting: Licensed Clinical Social Worker

## 2018-11-17 NOTE — Telephone Encounter (Signed)
Patient was called (1st attempt) due to a referral from her doctor. Patient did not answer, and a vm was left for the patient to call our office.

## 2018-11-30 ENCOUNTER — Other Ambulatory Visit: Payer: Self-pay

## 2018-12-02 ENCOUNTER — Telehealth: Payer: Self-pay | Admitting: Licensed Clinical Social Worker

## 2018-12-02 NOTE — Telephone Encounter (Signed)
Patient was called (2nd attempt) to schedule an appointment. Patient did not answer, and a vm was left for the patient.

## 2018-12-15 ENCOUNTER — Encounter: Payer: Self-pay | Admitting: Student in an Organized Health Care Education/Training Program

## 2018-12-15 ENCOUNTER — Other Ambulatory Visit: Payer: Self-pay | Admitting: *Deleted

## 2018-12-15 MED ORDER — BUPRENORPHINE HCL-NALOXONE HCL 4-1 MG SL FILM: ORAL_FILM | SUBLINGUAL | 0 refills | Status: DC

## 2018-12-15 NOTE — Telephone Encounter (Signed)
Spoke with the patient over the phone today.  We missed our telephone visit last week.  She is doing well on very low-dose of Suboxone, only 2 mg daily.  Wants to continue but potentially trying to come off this medicine and in 1 or 2 months.  Plan is to continue with Suboxone 2 mg daily.  Her mood is improved from our last visit, working with Miquel Dunn.  Our next available ED visit would be in January.  Could someone help me put her on the schedule for an OUD telephone visit on Tuesday January 5 at 9:15 AM please?

## 2018-12-23 ENCOUNTER — Ambulatory Visit: Payer: Medicaid Other | Admitting: Licensed Clinical Social Worker

## 2018-12-23 ENCOUNTER — Ambulatory Visit (INDEPENDENT_AMBULATORY_CARE_PROVIDER_SITE_OTHER): Payer: Medicaid Other | Admitting: Licensed Clinical Social Worker

## 2018-12-23 DIAGNOSIS — F329 Major depressive disorder, single episode, unspecified: Secondary | ICD-10-CM

## 2018-12-23 DIAGNOSIS — F419 Anxiety disorder, unspecified: Secondary | ICD-10-CM

## 2018-12-28 ENCOUNTER — Encounter: Payer: Self-pay | Admitting: Licensed Clinical Social Worker

## 2018-12-28 NOTE — BH Specialist Note (Signed)
Integrated Behavioral Health Initial Visit  MRN: [id number] Name: Marcia West  Number of [place] Clinician visits:: 1/6 Session Start time: 1:10  Session End time: 2:00 Total time: 50  minutes  Type of Service: Integrated Behavioral Health- Individual Interpretor:No.       SUBJECTIVE: Marcia West is a 33 y.o. female  whom attended the session individually. Patient was referred by Dr. Evette Doffing for anxiety and depression. Patient reports the following symptoms/concerns: anxiety, depression, life stressors, adjustment, and history of substance use.  Duration of problem: since the age of 26; Severity of problem: moderate  OBJECTIVE: Mood: Anxious and Affect: Constricted Risk of harm to self or others: No plan to harm self or others  LIFE CONTEXT: Family and Social: Patient lives with her mother and her daughter. Patient has two sons that are still residing in [place].  School/Work: Patient recently began working at [employer], and is adjusting to having a job.   GOALS ADDRESSED: Patient will: 1. Reduce symptoms of: anxiety, depression and stress 2. Increase knowledge and/or ability of: coping skills, healthy habits and stress reduction  3. Demonstrate ability to: Increase healthy adjustment to current life circumstances and Increase adequate support systems for patient/family  INTERVENTIONS: Interventions utilized: Motivational Interviewing, Mindfulness or Psychologist, educational, Behavioral Activation, Brief CBT and Supportive Counseling  Standardized Assessments completed: Not Needed  ASSESSMENT: Patient currently experiencing moderate levels of anxiety, and mild levels of depression. Patient reported having social anxiety since she was around 33 years old, and she has had challenges in reducing her anxiety over the years. Patient has a history of having panic attacks. Patient moved to  one year ago, and has made significant improvements in her life  style since her move. Patient identified that she wants to build her support system here in [place], and works towards reunification with her two sons in [place].    Patient may benefit from counseling.  PLAN: 1. Follow up with behavioral health clinician on : two weeks.   Dessie Coma, [facility], [place]

## 2019-01-09 DIAGNOSIS — Z20828 Contact with and (suspected) exposure to other viral communicable diseases: Secondary | ICD-10-CM | POA: Diagnosis not present

## 2019-01-09 DIAGNOSIS — R070 Pain in throat: Secondary | ICD-10-CM | POA: Diagnosis not present

## 2019-01-18 ENCOUNTER — Ambulatory Visit (INDEPENDENT_AMBULATORY_CARE_PROVIDER_SITE_OTHER): Payer: Medicaid Other | Admitting: Student in an Organized Health Care Education/Training Program

## 2019-01-18 ENCOUNTER — Other Ambulatory Visit: Payer: Self-pay

## 2019-01-18 DIAGNOSIS — F329 Major depressive disorder, single episode, unspecified: Secondary | ICD-10-CM

## 2019-01-18 DIAGNOSIS — F1120 Opioid dependence, uncomplicated: Secondary | ICD-10-CM

## 2019-01-18 DIAGNOSIS — F1190 Opioid use, unspecified, uncomplicated: Secondary | ICD-10-CM

## 2019-01-18 DIAGNOSIS — F1199 Opioid use, unspecified with unspecified opioid-induced disorder: Secondary | ICD-10-CM

## 2019-01-18 DIAGNOSIS — F419 Anxiety disorder, unspecified: Secondary | ICD-10-CM | POA: Diagnosis not present

## 2019-01-18 DIAGNOSIS — Z79899 Other long term (current) drug therapy: Secondary | ICD-10-CM | POA: Diagnosis not present

## 2019-01-18 MED ORDER — CLONAZEPAM 0.5 MG PO TABS: 0.5000 mg | ORAL_TABLET | Freq: Two times a day (BID) | ORAL | 2 refills | Status: DC

## 2019-01-18 MED ORDER — BUPRENORPHINE HCL-NALOXONE HCL 4-1 MG SL FILM: ORAL_FILM | SUBLINGUAL | 0 refills | Status: DC

## 2019-01-18 NOTE — Assessment & Plan Note (Signed)
Stable.  Doing well on low-dose of Suboxone.  Plan to continue with Suboxone 4 mg film take one half film per day.  Database reviewed and appropriate.  Follow-up with [place] in 4 weeks and we will obtain a tox assure at that time.

## 2019-01-18 NOTE — Assessment & Plan Note (Signed)
Stable currently, seemingly doing well on current medication regimen.  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  Given this long and significant exposure it will be very difficult to taper her off benzodiazepines.  Plan will be to continue with clonazepam 0.5 mg twice daily, from which she seems to be having no adverse side effects.  I sent a 34-month supply to her pharmacy.  We will also continue with fluoxetine and BuSpar.  She will continue to follow-up with our counselor, Phineas Semen.

## 2019-01-18 NOTE — Progress Notes (Signed)
  [facility] Internal Medicine Residency Telephone Encounter Continuity Care Appointment  HPI:   This telephone encounter was created for [name] on 01/18/2019 for the following purpose/cc opioid use disorder.  Patient is a 34 year old person for which we are doing a telephone follow-up for opioid use disorder.  Patient has been using Suboxone for many years through a few different treatment resources.  Over last few months she tried to taper herself off of Suboxone but had issues with increased cravings and some withdrawal symptoms.  Now she is on a very low-dose of Suboxone 2 mg once daily and reports good benefits.  No withdrawal symptoms, no cravings.  No oxycodone use, denies other drug use.  She also has a moderate anxiety and depression reports that her mood is currently stable.  She was able to visit [place] over the holiday break without problems.  Her son is with her currently at her house and doing virtual school which is going fine.  Seems like she is dealing with these normal stressors well.  She is anxious about the idea of titrating off clonazepam.  Shared with me that she has a long history of using benzodiazepines, reports being prescribed Xanax for at least 15 years prior to establishing care with our clinic.  Says that her last dose was 2 mg use twice daily through her prior psychiatrist.  She feels that if she were ever to come off the benzodiazepine it would require inpatient admission to manage to withdrawal.   Past Medical History:  Past Medical History:  Diagnosis Date  . Anxiety   . Bipolar affective ([facility])   . Depression   . Herpes simplex type II infection   . Substance abuse ([facility])       ROS:   no withdrawal symptoms   Assessment / Plan / Recommendations:   Please see A&P under problem oriented charting for assessment of the patient's acute and chronic medical conditions.   As always, pt is advised that if symptoms worsen or new symptoms arise,  they should go to an urgent care facility or to to ER for further evaluation.   Consent and Medical Decision Making:    This is a telephone encounter between [employer] and Tyson Alias on 01/18/2019 for opioid use disorder. The visit was conducted with the patient located at home and Tyson Alias at [facility]. The patient's identity was confirmed using their DOB and current address. The patient has consented to being evaluated through a telephone encounter and understands the associated risks (an examination cannot be done and the patient may need to come in for an appointment) / benefits (allows the patient to remain at home, decreasing exposure to coronavirus). I personally spent 8 minutes on medical discussion.

## 2019-01-20 ENCOUNTER — Ambulatory Visit: Payer: Medicaid Other | Admitting: Licensed Clinical Social Worker

## 2019-01-22 ENCOUNTER — Other Ambulatory Visit: Payer: Self-pay | Admitting: Internal Medicine

## 2019-01-24 NOTE — Telephone Encounter (Signed)
Next appt scheduled 02/15/19 in OUD clinic.

## 2019-02-15 ENCOUNTER — Ambulatory Visit (INDEPENDENT_AMBULATORY_CARE_PROVIDER_SITE_OTHER): Payer: Medicaid Other | Admitting: Internal Medicine

## 2019-02-15 ENCOUNTER — Other Ambulatory Visit: Payer: Self-pay

## 2019-02-15 VITALS — BP 113/72 | HR 80 | Temp 98.9°F | Ht 66.0 in | Wt 147.2 lb

## 2019-02-15 DIAGNOSIS — F419 Anxiety disorder, unspecified: Secondary | ICD-10-CM | POA: Insufficient documentation

## 2019-02-15 DIAGNOSIS — F1120 Opioid dependence, uncomplicated: Secondary | ICD-10-CM

## 2019-02-15 DIAGNOSIS — Z79899 Other long term (current) drug therapy: Secondary | ICD-10-CM

## 2019-02-15 DIAGNOSIS — F5105 Insomnia due to other mental disorder: Secondary | ICD-10-CM

## 2019-02-15 DIAGNOSIS — F329 Major depressive disorder, single episode, unspecified: Secondary | ICD-10-CM | POA: Diagnosis not present

## 2019-02-15 DIAGNOSIS — F1190 Opioid use, unspecified, uncomplicated: Secondary | ICD-10-CM

## 2019-02-15 DIAGNOSIS — G4700 Insomnia, unspecified: Secondary | ICD-10-CM | POA: Diagnosis not present

## 2019-02-15 DIAGNOSIS — F1199 Opioid use, unspecified with unspecified opioid-induced disorder: Secondary | ICD-10-CM

## 2019-02-15 MED ORDER — TRAZODONE HCL 50 MG PO TABS: 25.0000 mg | ORAL_TABLET | Freq: Every day | ORAL | 0 refills | Status: DC

## 2019-02-15 MED ORDER — BUPRENORPHINE HCL-NALOXONE HCL 4-1 MG SL FILM: ORAL_FILM | SUBLINGUAL | 0 refills | Status: DC

## 2019-02-15 NOTE — Patient Instructions (Signed)
Ms. Baysinger,  It was a pleasure to see you. Please continue taking your medicine as previously prescribed. Follow up with [place] in 4 weeks, tele health visit is fine.   Thank you!  Dr. Antony Contras

## 2019-02-15 NOTE — Assessment & Plan Note (Signed)
Patient has a history of depression and anxiety on fluoxetine, Buspar, and chronic benzodiazepine therapy. She feels these medications are helping but she is having issues with insomnia. She says she lays awake at night worrying and has a hard time shutting her brain off. She takes a long time to fall asleep and wakes up multiple times throughout the night. She has tried over the counter melatonin which she did not find effective. We discussed trazodone therapy. She says she has been on this in the past and it was very effective for her. She does not remember her prior dose.  -- Restart trazodone 50 mg (1/2 to 1 tablet) qhs PRN, can uptitrate at follow up if needed -- Continue fluoxetine, buspar, and klonopin

## 2019-02-15 NOTE — Assessment & Plan Note (Signed)
Stable. She is doing well on low dose of 2mg  suboxone once a day. Denies cravings or relapse. Reviewed database, refill history is appropriate. Last UDS appropriate as well, repeating today. Sent 1 month refill to her pharmacy. Follow up tele health 4 weeks.

## 2019-02-15 NOTE — Progress Notes (Signed)
   02/15/2019  Marcia West presents for follow up of opioid use disorder I have reviewed the prior induction visit, follow up visits, and telephone encounters relevant to opiate use disorder (OUD) treatment.   Current daily dose: Suboxone 4-2 mg (2 mg total daily dose)  Date of Induction: 03/30/2018  Current follow up interval, in weeks: 4 weeks  The patient has been adherent with the buprenorphine for OUD contract.   Last UDS Result: 07/27/18 appropriate   HPI: Patient is here for OUD follow up. For the details of today's visit, please refer to the assessment and plan.   Exam:   Vitals:   02/15/19 0929  BP: 113/72  Pulse: 80  Temp: 98.9 F (37.2 C)  TempSrc: Oral  SpO2: 99%  Weight: 147 lb 3.2 oz (66.8 kg)  Height: 5\' 6"  (1.676 m)    Constitutional: NAD, appears comfortable Cardiovascular: RRR, no murmurs, rubs, or gallops.  Pulmonary/Chest: CTAB, no wheezes, rales, or rhonchi. Extremities: Warm and well perfused. No edema.  Psychiatric: Normal mood and affect   Assessment/Plan:  See Problem Based Charting in the Encounters Tab     , MD  02/15/2019  9:49 AM

## 2019-02-17 LAB — TOXASSURE SELECT,+ANTIDEPR,UR

## 2019-03-15 ENCOUNTER — Ambulatory Visit (INDEPENDENT_AMBULATORY_CARE_PROVIDER_SITE_OTHER): Payer: Medicaid Other | Admitting: Internal Medicine

## 2019-03-15 ENCOUNTER — Other Ambulatory Visit: Payer: Self-pay

## 2019-03-15 DIAGNOSIS — F5105 Insomnia due to other mental disorder: Secondary | ICD-10-CM | POA: Diagnosis not present

## 2019-03-15 DIAGNOSIS — F419 Anxiety disorder, unspecified: Secondary | ICD-10-CM | POA: Diagnosis not present

## 2019-03-15 DIAGNOSIS — F1199 Opioid use, unspecified with unspecified opioid-induced disorder: Secondary | ICD-10-CM

## 2019-03-15 DIAGNOSIS — F1120 Opioid dependence, uncomplicated: Secondary | ICD-10-CM | POA: Diagnosis not present

## 2019-03-15 DIAGNOSIS — F1190 Opioid use, unspecified, uncomplicated: Secondary | ICD-10-CM

## 2019-03-15 MED ORDER — BUPRENORPHINE HCL-NALOXONE HCL 4-1 MG SL FILM: ORAL_FILM | SUBLINGUAL | 0 refills | Status: DC

## 2019-03-15 NOTE — Assessment & Plan Note (Signed)
She notes that she is doing better and only occasionally using the trazodone.  She feels that her job at the time of last visit was contributing to her issues sleeping and she has now changed jobs and this has improved.   Plan Continue PRN trazodone as needed.

## 2019-03-15 NOTE — Progress Notes (Signed)
  [facility] Internal Medicine Residency Telephone Encounter Continuity Care Appointment  HPI:   This telephone encounter was created for Ms. Lennyn Knippenberg on 03/15/2019 for the following purpose/cc follow up of OUD.  Odella Butner presents for follow up of opioid use disorder I have reviewed the prior induction visit, follow up visits, and telephone encounters relevant to opiate use disorder (OUD) treatment.   Current daily dose: 2mg  buprenorphine daily  Date of Induction: 03/30/18  Current follow up interval, in weeks: 4  The patient has been adherent with the buprenorphine for OUD contract.   Last UDS Result: February 2021, appropriate  HPI: Ms. Klawitter is a woman living with OUD who presents for follow up.  She confirmed her DOB and address prior to our discussion.  She is doing very well.  She has weaned down to 1/2 of a 4-2mg  film per day.  She is interested in weaning further.  She notes that she would like to go completely off the medication and we discussed ways to further taper.  She has no concerns, no cravings, no relapse.      Past Medical History:  Past Medical History:  Diagnosis Date  . Anxiety   . Bipolar affective ([facility])   . Depression   . Herpes simplex type II infection   . Substance abuse ([facility])       ROS:   No concerns today, negative except per HPI.   Assessment / Plan / Recommendations:   Please see A&P under problem oriented charting for assessment of the patient's acute and chronic medical conditions.   As always, pt is advised that if symptoms worsen or new symptoms arise, they should go to an urgent care facility or to to ER for further evaluation.   Consent and Medical Decision Making:   This is a telephone encounter between [employer] and [employer] on 03/15/2019 for follow up of OUD. The visit was conducted with the patient located at home and 05/15/2019 at [facility]. The patient's identity was confirmed using their DOB and current address.  The patient has consented to being evaluated through a telephone encounter and understands the associated risks (an examination cannot be done and the patient may need to come in for an appointment) / benefits (allows the patient to remain at home, decreasing exposure to coronavirus). I personally spent 10 minutes on medical discussion.     Return in 1 month for telehealth, 2 months for in person

## 2019-03-15 NOTE — Assessment & Plan Note (Signed)
Doing well on a very low dose of buprenorphine-naloxone.  No issues reported today.  We discussed tapering plans including going without one day per week, increasing to 2 days per week and so forth until she is off the medication.  She is nervous to stop the medication and I think a prolonged patient led taper is appropriate for her.  She will continue to monitor for withdrawal and cravings.  PDMP was reviewed and is appropriate.   Plan Continue Buprenorphine-naloxone 2-1mg  daily, wean as tolerated UDS at next in person visit could be considered Near to weaning off completely!  I congratulated her on this success.

## 2019-04-24 ENCOUNTER — Other Ambulatory Visit: Payer: Self-pay | Admitting: Internal Medicine

## 2019-04-24 DIAGNOSIS — F419 Anxiety disorder, unspecified: Secondary | ICD-10-CM

## 2019-04-24 DIAGNOSIS — F5105 Insomnia due to other mental disorder: Secondary | ICD-10-CM

## 2019-05-03 ENCOUNTER — Other Ambulatory Visit: Payer: Self-pay | Admitting: Internal Medicine

## 2019-05-03 DIAGNOSIS — F419 Anxiety disorder, unspecified: Secondary | ICD-10-CM

## 2019-05-03 DIAGNOSIS — F329 Major depressive disorder, single episode, unspecified: Secondary | ICD-10-CM

## 2019-05-08 ENCOUNTER — Other Ambulatory Visit: Payer: Self-pay | Admitting: Student in an Organized Health Care Education/Training Program

## 2019-05-23 ENCOUNTER — Telehealth: Payer: Self-pay | Admitting: *Deleted

## 2019-05-23 NOTE — Telephone Encounter (Signed)
Pt had sent message to front office about an appt and Suboxone.  Called pt - no answer; left message on self identified vm to call the office.

## 2019-06-21 ENCOUNTER — Ambulatory Visit: Payer: Medicaid Other

## 2019-06-23 ENCOUNTER — Other Ambulatory Visit: Payer: Self-pay | Admitting: *Deleted

## 2019-06-23 DIAGNOSIS — F1190 Opioid use, unspecified, uncomplicated: Secondary | ICD-10-CM

## 2019-06-23 MED ORDER — BUPRENORPHINE HCL-NALOXONE HCL 4-1 MG SL FILM: ORAL_FILM | SUBLINGUAL | 0 refills | Status: DC

## 2019-06-23 NOTE — Telephone Encounter (Signed)
Called patient about her refill request. At her last visit in March, she was actively working to wean herself off of suboxone. She was taking 1/2 of a 4-1 mg film daily. She has continued to wean herself down, reports going 3-4 days without taking anything but then develops bad cravings and will take part of a strip. She says she needs help getting herself completely off suboxone and would like to come in to be seen. She has been scheduled for our next available OUD appointment on 7/6 @ 9:15 am. Currently she only needs ~ 1 strip a week. Will send short term refill to last her until her next appointment. Congratulated patient on her work to wean herself down. Encouraged her to continue her efforts.

## 2019-06-25 ENCOUNTER — Other Ambulatory Visit: Payer: Self-pay | Admitting: Internal Medicine

## 2019-06-25 DIAGNOSIS — F419 Anxiety disorder, unspecified: Secondary | ICD-10-CM

## 2019-08-22 ENCOUNTER — Other Ambulatory Visit: Payer: Self-pay | Admitting: Internal Medicine

## 2019-08-22 NOTE — Telephone Encounter (Signed)
Called pt, got vmail that identified as pt's, lm for rtc asap

## 2019-08-22 NOTE — Telephone Encounter (Signed)
She has no showed to the last 2 appointments.  Will send in 1/2 supply (2 weeks) with expectation that she present for an appointment in OUD clinic.  She should establish PCP care here as well for continued refills of anxiety medication.  Debe Coder, MD

## 2019-09-02 ENCOUNTER — Telehealth: Payer: Self-pay | Admitting: General Practice

## 2019-09-02 NOTE — Telephone Encounter (Signed)
Please overbook at 945am.  We have 2 telehealth visits that will be quick.  THanks.

## 2019-09-02 NOTE — Telephone Encounter (Signed)
Pt wants an OUD appt for 08/24 schedule is booked [phone]

## 2019-09-04 ENCOUNTER — Other Ambulatory Visit: Payer: Self-pay | Admitting: Internal Medicine

## 2019-09-04 DIAGNOSIS — F1190 Opioid use, unspecified, uncomplicated: Secondary | ICD-10-CM

## 2019-09-05 NOTE — Telephone Encounter (Signed)
Patient with two recent no show visits, PDMP shows no recent refills of this medicine. Would prefer for her to at least do a televisit in OUD clinic tomorrow prior to prescribing. We need to check in to ensure she is still taking this medicine and to see if it is giving her any benefit. Dr. Criselda Peaches mentioned overbooking 9:45 spot, is that still possible?

## 2019-09-06 ENCOUNTER — Other Ambulatory Visit: Payer: Self-pay | Admitting: *Deleted

## 2019-09-06 DIAGNOSIS — F1190 Opioid use, unspecified, uncomplicated: Secondary | ICD-10-CM

## 2019-09-06 MED ORDER — BUPRENORPHINE HCL-NALOXONE HCL 4-1 MG SL FILM: ORAL_FILM | SUBLINGUAL | 0 refills | Status: DC

## 2019-09-06 NOTE — Telephone Encounter (Signed)
Patient has missed two appointments and failed to make an appointment today. She has an appointment set for one week. She was given clear instructions that further missed appointments would lead to dismissal from our MAT program.

## 2019-09-13 ENCOUNTER — Ambulatory Visit (INDEPENDENT_AMBULATORY_CARE_PROVIDER_SITE_OTHER): Payer: Medicaid Other | Admitting: Internal Medicine

## 2019-09-13 ENCOUNTER — Other Ambulatory Visit: Payer: Self-pay

## 2019-09-13 VITALS — BP 125/82 | HR 77 | Temp 99.1°F

## 2019-09-13 DIAGNOSIS — R197 Diarrhea, unspecified: Secondary | ICD-10-CM | POA: Diagnosis not present

## 2019-09-13 DIAGNOSIS — R6883 Chills (without fever): Secondary | ICD-10-CM | POA: Diagnosis not present

## 2019-09-13 DIAGNOSIS — F419 Anxiety disorder, unspecified: Secondary | ICD-10-CM | POA: Diagnosis not present

## 2019-09-13 DIAGNOSIS — R0602 Shortness of breath: Secondary | ICD-10-CM | POA: Diagnosis not present

## 2019-09-13 DIAGNOSIS — F329 Major depressive disorder, single episode, unspecified: Secondary | ICD-10-CM

## 2019-09-13 DIAGNOSIS — F1120 Opioid dependence, uncomplicated: Secondary | ICD-10-CM

## 2019-09-13 DIAGNOSIS — F1190 Opioid use, unspecified, uncomplicated: Secondary | ICD-10-CM

## 2019-09-13 MED ORDER — BUPRENORPHINE HCL-NALOXONE HCL 4-1 MG SL FILM: ORAL_FILM | SUBLINGUAL | 0 refills | Status: DC

## 2019-09-13 NOTE — Progress Notes (Signed)
   09/13/2019  Marcia West presents for follow up of opioid use disorder I have reviewed the prior induction visit, follow up visits, and telephone encounters relevant to opiate use disorder (OUD) treatment.   Current daily dose: Suboxone 4-1 mg, quarter every 2-3 days per patient   Date of Induction: 03/30/2018  Current follow up interval, in weeks: 4 weeks  The patient has been adherent with the buprenorphine for OUD contract.   Last UDS Result: February 2021, appropriate   HPI:   [name] states she has continued to work on weaning her Suboxone dose, and is now using a small piece of film every 2-3 days. She is struggling with anxiety, SOB, chills, diarrhea (for the last 3 weeks) on the 3rd day and is concerned this is secondary to withdrawals. She denies any cravings for opioids specifically, but occasionally for alcohol and THC. She uses alcohol approximately once per week and THC 3-4 times per week, both inhaled and ingested. She denies any relapses.   She notes that her anxiety overall has significantly worsened over the last few months, with frequent racing thoughts that are intrusive and negative. She has difficult controlling this these feelings and is opening to get connected with a psychiatrist. [name] notes that in the past, she has been on several SSRIs, including Prozac (currently active), Zoloft. In addition, she has been on Buspar, Trileptal, Xanax and Clonazepam (currently active). The clonazepam has been helping her anxiety quite a bit but [name] was hoping to reduce her reliance on medication.   Exam:   Vitals:   09/13/19 0903  BP: 125/82  Pulse: 77  Temp: 99.1 F (37.3 C)  TempSrc: Oral  SpO2: 99%   Physical Exam Vitals and nursing note reviewed.  Constitutional:      General: She is not in acute distress.    Appearance: She is normal weight.  HENT:     Mouth/Throat:     Mouth: Mucous membranes are moist.     Pharynx: Oropharynx is clear.    Cardiovascular:     Rate and Rhythm: Normal rate and regular rhythm.     Heart sounds: No murmur heard.  No gallop.   Pulmonary:     Effort: Pulmonary effort is normal. No respiratory distress.     Breath sounds: Normal breath sounds. No wheezing.  Abdominal:     General: Bowel sounds are normal. There is no distension.     Palpations: Abdomen is soft.     Tenderness: There is no abdominal tenderness.  Skin:    General: Skin is warm and dry.  Neurological:     General: No focal deficit present.     Mental Status: She is alert and oriented to person, place, and time. Mental status is at baseline.     Gait: Gait normal.  Psychiatric:        Mood and Affect: Mood normal.        Behavior: Behavior normal.        Thought Content: Thought content normal.        Judgment: Judgment normal.    Assessment/Plan:  See Problem Based Charting in the Encounters Tab  Verdene Lennert, MD 09/13/2019  10:26 AM

## 2019-09-13 NOTE — Assessment & Plan Note (Signed)
[name] states she has continued to work on weaning her Suboxone dose, and is now using a small piece of film every 2-3 days. She is struggling with anxiety, SOB, chills, diarrhea (for the last 3 weeks) on the 3rd day and is concerned this is secondary to withdrawals. She denies any cravings for opioids specifically, but occasionally for alcohol and THC. She uses alcohol approximately once per week and THC 3-4 times per week, both inhaled and ingested. She denies any relapses.   Assessment/Plan:  Discussed with patient that her symptoms are unlikely secondary to withdrawal given the very small dose she is on, but more likely from her anxiety. I am concerned she will develop further cravings with the current dosing though and we discussed increasing her dose temporarily. Marcia West is in agreement with this plan. Hopefully once her anxiety/depression are better controlled, we can re-address a taper.   We were unable to obtain ToxAssure today given patient was in the negative pressure room.   - Suboxone 4-1 mg 1/2 film daily  - 4 week follow up - Toxassure at next visit

## 2019-09-13 NOTE — Assessment & Plan Note (Addendum)
Marcia West notes that her anxiety overall has significantly worsened over the last few months, with frequent racing thoughts that are intrusive and negative. She has difficult controlling this these feelings. She is open to get connected with a psychiatrist. Marcia West notes that in the past, she has been on several SSRIs, including Prozac (currently active), Zoloft. In addition, she has been on Buspar, Trileptal, Xanax and Clonazepam (currently active). The clonazepam has been helping her anxiety quite a bit but Marcia West was hoping to reduce her reliance on medication.   Patient denies any SI or HI. She knows to contact 911 or the crisis center if she should develop these thoughts.   Assessment/Plan:  Severe anxiety and depression that is currently uncontrolled and worsening, as seen on her GAD-7 and PHQ-9 scoring (Today, GAD-7 is 21 and PHQ-9 is 22) . She has had a telehealth appointment with Phineas Semen at our clinic once in December 2020 and had a good experience, however she will need specialized care given severity and the extensive history of medication she has previously been on.    - Continue with Clonazepam and Prozac - Placed referral for psychiatry

## 2019-09-13 NOTE — Patient Instructions (Addendum)
It was nice seeing you today! Thank you for choosing [facility] for your Primary Care.    Today we talked about:   1. OUD: Let's transition to more consistent dosing while we work on treating your anxiety and depression. Take a quarter of film per day. We sent an updated prescription to the pharmacy.   2. Anxiety and Depression: I sent in a referral to psychiatry. For now, continue taking Clonazepam and Prozac without any changes.

## 2019-09-14 ENCOUNTER — Other Ambulatory Visit: Payer: Self-pay | Admitting: Student

## 2019-09-14 ENCOUNTER — Telehealth: Payer: Self-pay | Admitting: General Practice

## 2019-09-14 DIAGNOSIS — F419 Anxiety disorder, unspecified: Secondary | ICD-10-CM

## 2019-09-14 LAB — NOVEL CORONAVIRUS, NAA: SARS-CoV-2, NAA: NOT DETECTED

## 2019-09-14 MED ORDER — CLONAZEPAM 0.5 MG PO TABS: 0.5000 mg | ORAL_TABLET | Freq: Two times a day (BID) | ORAL | 0 refills | Status: DC

## 2019-09-14 NOTE — Telephone Encounter (Signed)
Discussed results with patient

## 2019-09-14 NOTE — Progress Notes (Signed)
Internal Medicine Clinic Attending  Case discussed with Dr. Basaraba  At the time of the visit.  We reviewed the resident's history and exam and pertinent patient test results.  I agree with the assessment, diagnosis, and plan of care documented in the resident's note.  

## 2019-09-14 NOTE — Telephone Encounter (Signed)
Pt is calling regarding results [phone]

## 2019-09-22 ENCOUNTER — Telehealth: Payer: Self-pay | Admitting: General Practice

## 2019-09-22 NOTE — Telephone Encounter (Signed)
Returned call to patient. Requested to schedule OUD appt and PCP appt. States she discussed establishing care at [facility] at last OUD appt. Explained we are not having separate clinic for the month of Sept. Appt made for 10/11/2019 at 3:45 with Yellow Team for OUD/Anxiety f/u and neck/shoulder pain. She was very Adult nurse. Kinnie Feil, BSN, RN-BC

## 2019-09-22 NOTE — Telephone Encounter (Signed)
Pls contact pt regarding OUD  [phone]

## 2019-09-27 ENCOUNTER — Encounter: Payer: Self-pay | Admitting: Licensed Clinical Social Worker

## 2019-10-11 ENCOUNTER — Encounter: Payer: Medicaid Other | Admitting: Internal Medicine

## 2019-10-14 ENCOUNTER — Other Ambulatory Visit: Payer: Self-pay

## 2019-10-14 ENCOUNTER — Emergency Department
Admission: EM | Admit: 2019-10-14 | Discharge: 2019-10-15 | Disposition: A | Discharge status: Home / Self Care | Payer: Medicaid Other | Attending: Emergency Medicine | Admitting: Emergency Medicine

## 2019-10-14 DIAGNOSIS — F309 Manic episode, unspecified: Secondary | ICD-10-CM | POA: Diagnosis not present

## 2019-10-14 DIAGNOSIS — Z20822 Contact with and (suspected) exposure to covid-19: Secondary | ICD-10-CM | POA: Diagnosis not present

## 2019-10-14 DIAGNOSIS — F302 Manic episode, severe with psychotic symptoms: Secondary | ICD-10-CM | POA: Diagnosis not present

## 2019-10-14 DIAGNOSIS — F17210 Nicotine dependence, cigarettes, uncomplicated: Secondary | ICD-10-CM | POA: Insufficient documentation

## 2019-10-14 DIAGNOSIS — R413 Other amnesia: Secondary | ICD-10-CM | POA: Diagnosis present

## 2019-10-14 LAB — URINE DRUG SCREEN, QUALITATIVE (ARMC ONLY)
Amphetamines, Ur Screen: NOT DETECTED
Barbiturates, Ur Screen: NOT DETECTED
Benzodiazepine, Ur Scrn: NOT DETECTED
Cannabinoid 50 Ng, Ur ~~LOC~~: POSITIVE — AB
Cocaine Metabolite,Ur ~~LOC~~: NOT DETECTED
MDMA (Ecstasy)Ur Screen: NOT DETECTED
Methadone Scn, Ur: NOT DETECTED
Opiate, Ur Screen: NOT DETECTED
Phencyclidine (PCP) Ur S: NOT DETECTED
Tricyclic, Ur Screen: NOT DETECTED

## 2019-10-14 LAB — CBC
HCT: 42.2 % (ref 36.0–46.0)
Hemoglobin: 15 g/dL (ref 12.0–15.0)
MCH: 32.1 pg (ref 26.0–34.0)
MCHC: 35.5 g/dL (ref 30.0–36.0)
MCV: 90.2 fL (ref 80.0–100.0)
Platelets: 249 10*3/uL (ref 150–400)
RBC: 4.68 MIL/uL (ref 3.87–5.11)
RDW: 12.2 % (ref 11.5–15.5)
WBC: 9.6 10*3/uL (ref 4.0–10.5)
nRBC: 0 % (ref 0.0–0.2)

## 2019-10-14 LAB — COMPREHENSIVE METABOLIC PANEL
ALT: 15 U/L (ref 0–44)
AST: 22 U/L (ref 15–41)
Albumin: 5.1 g/dL — ABNORMAL HIGH (ref 3.5–5.0)
Alkaline Phosphatase: 42 U/L (ref 38–126)
Anion gap: 11 (ref 5–15)
BUN: 15 mg/dL (ref 6–20)
CO2: 23 mmol/L (ref 22–32)
Calcium: 9.4 mg/dL (ref 8.9–10.3)
Chloride: 103 mmol/L (ref 98–111)
Creatinine, Ser: 0.84 mg/dL (ref 0.44–1.00)
GFR calc Af Amer: >60 mL/min (ref >60)
GFR calc non Af Amer: >60 mL/min (ref >60)
Glucose, Bld: 119 mg/dL — ABNORMAL HIGH (ref 70–99)
Potassium: 3.9 mmol/L (ref 3.5–5.1)
Sodium: 137 mmol/L (ref 135–145)
Total Bilirubin: 0.8 mg/dL (ref 0.3–1.2)
Total Protein: 8.4 g/dL — ABNORMAL HIGH (ref 6.5–8.1)

## 2019-10-14 LAB — SALICYLATE LEVEL: Salicylate Lvl: <7 mg/dL — ABNORMAL LOW (ref 7.0–30.0)

## 2019-10-14 LAB — ETHANOL: Alcohol, Ethyl (B): <10 mg/dL (ref <10)

## 2019-10-14 LAB — POCT PREGNANCY, URINE: Preg Test, Ur: NEGATIVE

## 2019-10-14 LAB — ACETAMINOPHEN LEVEL: Acetaminophen (Tylenol), Serum: <10 ug/mL — ABNORMAL LOW (ref 10–30)

## 2019-10-14 MED ORDER — TRAZODONE HCL 50 MG PO TABS
50.0000 mg | ORAL_TABLET | Freq: Every day | ORAL | Status: DC
  Administered 2019-10-14: 50 mg via ORAL
  Filled 2019-10-14: qty 1

## 2019-10-14 NOTE — ED Provider Notes (Signed)
[facility] Emergency Department Provider Note  ____________________________________________  Time seen: Approximately 10:33 PM  I have reviewed the triage vital signs and the nursing notes.   HISTORY  Chief Complaint "to stop forgetting everything"    HPI Marcia West is a 34 y.o. female with a history of bipolar affective disorder and substance abuse who comes ED complaining of forgetfulness, paranoia, anxiousness, racing disorganized thoughts. Denies any recent drug abuse other than marijuana. Denies any pain or recent illness, no trauma. Symptoms are constant without aggravating or alleviating factors. Denies SI or HI, but endorses auditory hallucinations.      Past Medical History:  Diagnosis Date  . Anxiety   . Bipolar affective (HCC)   . Depression   . Herpes simplex type II infection   . Substance abuse [facility])      Patient Active Problem List   Diagnosis Date Noted  . 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 Anxiety and depression 11/05/2017  . Opioid use disorder 10/22/2017     Past Surgical History:  Procedure Laterality Date  . NO PAST SURGERIES    . WISDOM TOOTH EXTRACTION       Prior to Admission medications   Medication Sig Start Date End Date Taking? Authorizing Provider  Buprenorphine HCl-Naloxone HCl 4-1 MG FILM Take 1/2 film daily SL. 09/20/19   Mikey Bussing, Marthenia Rolling, DO  clonazePAM (KLONOPIN) 0.5 MG tablet Take 1 tablet (0.5 mg total) by mouth 2 (two) times daily for 30 doses. 09/22/19 10/07/19  Evlyn Kanner, MD  FLUoxetine (PROZAC) 20 MG capsule TAKE 3 CAPSULES (60 MG) BY MOUTH DAILY. 05/03/19   Inez Catalina, MD  norethindrone (MICRONOR,CAMILA,ERRIN) 0.35 MG tablet Take 1 tablet (0.35 mg total) by mouth daily. 02/22/18   Levie Heritage, DO  traZODone (DESYREL) 50 MG tablet TAKE 1/2 TO 1 TABLETS (25-50 MG TOTAL) BY MOUTH AT BEDTIME. 06/27/19   Reymundo Poll, MD     Allergies Patient has no known  allergies.   Family History  Problem Relation Age of Onset  . Arthritis Maternal Grandmother   . Diabetes Maternal Grandmother   . Diabetes Maternal Grandfather   . Obesity Paternal Grandmother     Social History Social History   Tobacco Use  . Smoking status: Current Every Day Smoker    Packs/day: 0.25    Types: Cigarettes  . Smokeless tobacco: Never Used  . Tobacco comment: 4 cigs per day  Vaping Use  . Vaping Use: Never used  Substance Use Topics  . Alcohol use: Not Currently  . Drug use: Not Currently    Types: Methamphetamines, Other-see comments, Marijuana, "Crack" cocaine    Comment: suboxone daily    Review of Systems  Constitutional:   No fever or chills.  ENT:   No sore throat. No rhinorrhea. Cardiovascular:   No chest pain or syncope. Respiratory:   No dyspnea or cough. Gastrointestinal:   Negative for abdominal pain, vomiting and diarrhea.  Musculoskeletal:   Negative for focal pain or swelling All other systems reviewed and are negative except as documented above in ROS and HPI.  ____________________________________________   PHYSICAL EXAM:  VITAL SIGNS: ED Triage Vitals [10/14/19 2107]  Enc Vitals Group     BP (!) 156/99     Pulse Rate 79     Resp 16     Temp 99 F (37.2 C)     Temp Source Oral     SpO2 100 %     Weight 115 lb (  52.2 kg)     Height 5\' 2"  (1.575 m)     Head Circumference      Peak Flow      Pain Score 0     Pain Loc      Pain Edu?      Excl. in GC?     Vital signs reviewed, nursing assessments reviewed.   Constitutional:   Alert and oriented. Non-toxic appearance. Eyes:   Conjunctivae are normal. EOMI. PERRL. ENT      Head:   Normocephalic and atraumatic.      Nose:   Wearing a mask.      Mouth/Throat:   Wearing a mask.      Neck:   No meningismus. Full ROM.  Cardiovascular:   RRR. Symmetric bilateral radial and DP pulses.  No murmurs. Cap refill less than 2 seconds. Respiratory:   Normal respiratory effort  without tachypnea/retractions. Breath sounds are clear and equal bilaterally. No wheezes/rales/rhonchi.   Musculoskeletal:   Normal range of motion in all extremities. No joint effusions.  No lower extremity tenderness.  No edema. Neurologic:   Normal speech and language.  Motor grossly intact. No acute focal neurologic deficits are appreciated.  Skin:    Skin is warm, dry and intact. No rash noted.  No petechiae, purpura, or bullae.  ____________________________________________    LABS (pertinent positives/negatives) (all labs ordered are listed, but only abnormal results are displayed) Labs Reviewed  COMPREHENSIVE METABOLIC PANEL - Abnormal; Notable for the following components:      Result Value   Glucose, Bld 119 (*)    Total Protein 8.4 (*)    Albumin 5.1 (*)    All other components within normal limits  SALICYLATE LEVEL - Abnormal; Notable for the following components:   Salicylate Lvl <7.0 (*)    All other components within normal limits  ACETAMINOPHEN LEVEL - Abnormal; Notable for the following components:   Acetaminophen (Tylenol), Serum <10 (*)    All other components within normal limits  URINE DRUG SCREEN, QUALITATIVE ([facility] ONLY) - Abnormal; Notable for the following components:   Cannabinoid 50 Ng, Ur [place] POSITIVE (*)    All other components within normal limits  RESPIRATORY PANEL BY RT PCR (FLU A&B, COVID)  ETHANOL  CBC  POC URINE PREG, ED  POCT PREGNANCY, URINE   ____________________________________________   EKG    ____________________________________________    RADIOLOGY  No results found.  ____________________________________________   PROCEDURES Procedures  ____________________________________________    CLINICAL IMPRESSION / ASSESSMENT AND PLAN / ED COURSE  Medications ordered in the ED: Medications - No data to display  Pertinent labs & imaging results that were available during my care of the patient were reviewed by me and  considered in my medical decision making (see chart for details).  Marcia West was evaluated in Emergency Department on 10/14/2019 for the symptoms described in the history of present illness. She was evaluated in the context of the global COVID-19 pandemic, which necessitated consideration that the patient might be at risk for infection with the SARS-CoV-2 virus that causes COVID-19. Institutional protocols and algorithms that pertain to the evaluation of patients at risk for COVID-19 are in a state of rapid change based on information released by regulatory bodies including the CDC and federal and state organizations. These policies and algorithms were followed during the patient's care in the ED.   Patient presents with multiple symptoms of mania. She is not floridly psychotic, vital signs are normal, exam is benign,  she is nontoxic, she is medically stable. Discussed with psychiatry who will plan for psychiatric hospitalization. I will initiate IVC to maintain safety here in the ED until psychiatric bed placement can be arranged.  The patient has been placed in psychiatric observation due to the need to provide a safe environment for the patient while obtaining psychiatric consultation and evaluation, as well as ongoing medical and medication management to treat the patient's condition.  The patient has been placed under full IVC at this time. .      ____________________________________________   FINAL CLINICAL IMPRESSION(S) / ED DIAGNOSES    Final diagnoses:  [facility])     ED Discharge Orders    None      Portions of this note were generated with dragon dictation software. Dictation errors may occur despite best attempts at proofreading.   Sharman Cheek, MD 10/14/19 2236

## 2019-10-14 NOTE — ED Triage Notes (Signed)
Pt states is here for a psych evaluation "to see a psychiatrist". Pt states she wants to "stop forgetting everything". Pt states she feels jittery, denies SI or HI.

## 2019-10-15 ENCOUNTER — Encounter: Payer: Self-pay | Admitting: Psychiatric/Mental Health

## 2019-10-15 ENCOUNTER — Inpatient Hospital Stay
Admission: RE | Admit: 2019-10-15 | Discharge: 2019-10-21 | DRG: 885 | Disposition: A | Discharge status: Home / Self Care | Payer: Medicaid Other | Source: Intra-hospital | Attending: Behavioral Health | Admitting: Behavioral Health

## 2019-10-15 DIAGNOSIS — F17210 Nicotine dependence, cigarettes, uncomplicated: Secondary | ICD-10-CM | POA: Diagnosis present

## 2019-10-15 DIAGNOSIS — F5105 Insomnia due to other mental disorder: Secondary | ICD-10-CM | POA: Diagnosis present

## 2019-10-15 DIAGNOSIS — F302 Manic episode, severe with psychotic symptoms: Secondary | ICD-10-CM | POA: Diagnosis not present

## 2019-10-15 DIAGNOSIS — Z833 Family history of diabetes mellitus: Secondary | ICD-10-CM

## 2019-10-15 DIAGNOSIS — F411 Generalized anxiety disorder: Secondary | ICD-10-CM | POA: Diagnosis present

## 2019-10-15 DIAGNOSIS — F3110 Bipolar disorder, current episode manic without psychotic features, unspecified: Principal | ICD-10-CM | POA: Diagnosis present

## 2019-10-15 DIAGNOSIS — F1190 Opioid use, unspecified, uncomplicated: Secondary | ICD-10-CM | POA: Diagnosis present

## 2019-10-15 DIAGNOSIS — Z23 Encounter for immunization: Secondary | ICD-10-CM

## 2019-10-15 DIAGNOSIS — F419 Anxiety disorder, unspecified: Secondary | ICD-10-CM | POA: Diagnosis present

## 2019-10-15 DIAGNOSIS — F309 Manic episode, unspecified: Secondary | ICD-10-CM | POA: Diagnosis not present

## 2019-10-15 DIAGNOSIS — Z818 Family history of other mental and behavioral disorders: Secondary | ICD-10-CM | POA: Diagnosis not present

## 2019-10-15 DIAGNOSIS — F1120 Opioid dependence, uncomplicated: Secondary | ICD-10-CM | POA: Diagnosis present

## 2019-10-15 DIAGNOSIS — Z79899 Other long term (current) drug therapy: Secondary | ICD-10-CM

## 2019-10-15 LAB — RESPIRATORY PANEL BY RT PCR (FLU A&B, COVID)
Influenza A by PCR: NEGATIVE
Influenza B by PCR: NEGATIVE
SARS Coronavirus 2 by RT PCR: NEGATIVE

## 2019-10-15 MED ORDER — LITHIUM CARBONATE 150 MG PO CAPS
150.0000 mg | ORAL_CAPSULE | Freq: Two times a day (BID) | ORAL | Status: DC
  Administered 2019-10-15 – 2019-10-20 (×10): 150 mg via ORAL
  Filled 2019-10-15 (×10): qty 1

## 2019-10-15 MED ORDER — TRAZODONE HCL 100 MG PO TABS
100.0000 mg | ORAL_TABLET | Freq: Every day | ORAL | Status: DC
  Administered 2019-10-15 – 2019-10-20 (×6): 100 mg via ORAL
  Filled 2019-10-15 (×5): qty 1

## 2019-10-15 MED ORDER — ACETAMINOPHEN 325 MG PO TABS: 650.0000 mg | ORAL_TABLET | Freq: Four times a day (QID) | ORAL | Status: DC | PRN

## 2019-10-15 MED ORDER — ALUM & MAG HYDROXIDE-SIMETH 200-200-20 MG/5ML PO SUSP: 30.0000 mL | ORAL | Status: DC | PRN

## 2019-10-15 MED ORDER — MAGNESIUM HYDROXIDE 400 MG/5ML PO SUSP: 30.0000 mL | Freq: Every day | ORAL | Status: DC | PRN

## 2019-10-15 MED ORDER — NICOTINE 21 MG/24HR TD PT24
21.0000 mg | MEDICATED_PATCH | Freq: Every day | TRANSDERMAL | Status: DC
  Administered 2019-10-15 – 2019-10-21 (×7): 21 mg via TRANSDERMAL
  Filled 2019-10-15 (×7): qty 1

## 2019-10-15 MED ORDER — INFLUENZA VAC SPLIT QUAD 0.5 ML IM SUSY
0.5000 mL | PREFILLED_SYRINGE | INTRAMUSCULAR | Status: DC | PRN
  Filled 2019-10-15: qty 0.5

## 2019-10-15 MED ORDER — FLUOXETINE HCL 20 MG PO CAPS
60.0000 mg | ORAL_CAPSULE | Freq: Every day | ORAL | Status: DC
  Administered 2019-10-15 – 2019-10-17 (×3): 60 mg via ORAL
  Filled 2019-10-15 (×3): qty 3

## 2019-10-15 MED ORDER — CLONAZEPAM 0.5 MG PO TABS
0.5000 mg | ORAL_TABLET | Freq: Two times a day (BID) | ORAL | Status: DC
  Administered 2019-10-15 – 2019-10-16 (×2): 0.5 mg via ORAL
  Filled 2019-10-15 (×2): qty 1

## 2019-10-15 MED ORDER — BUPRENORPHINE HCL 2 MG SL SUBL
2.0000 mg | SUBLINGUAL_TABLET | Freq: Every day | SUBLINGUAL | Status: DC
  Administered 2019-10-15 – 2019-10-17 (×3): 2 mg via SUBLINGUAL
  Filled 2019-10-15 (×4): qty 1

## 2019-10-15 MED ORDER — TRIHEXYPHENIDYL HCL 2 MG PO TABS
1.0000 mg | ORAL_TABLET | Freq: Two times a day (BID) | ORAL | Status: DC
  Administered 2019-10-15 – 2019-10-18 (×6): 1 mg via ORAL
  Filled 2019-10-15 (×7): qty 1

## 2019-10-15 MED ORDER — TRAZODONE HCL 50 MG PO TABS
50.0000 mg | ORAL_TABLET | Freq: Every evening | ORAL | Status: DC | PRN
  Administered 2019-10-17 – 2019-10-20 (×3): 50 mg via ORAL
  Filled 2019-10-15 (×6): qty 1

## 2019-10-15 MED ORDER — THIOTHIXENE 2 MG PO CAPS
2.0000 mg | ORAL_CAPSULE | Freq: Two times a day (BID) | ORAL | Status: DC
  Administered 2019-10-15 – 2019-10-16 (×2): 2 mg via ORAL
  Filled 2019-10-15 (×3): qty 1

## 2019-10-15 NOTE — BH Assessment (Signed)
PATIENT BED AVAILABLE AFTER 9:30AM  Patient is to be admitted to [facility] by Psychiatric Nurse Practitioner Lerry Liner. Attending Physician will be Dr. Toni Amend.   Patient has been assigned to room 307, by [facility] Charge Nurse Britta Mccreedy.    ER staff is aware of the admission:  [facility] ER Secretary    Dr. Don Perking , ER MD   Dewayne Hatch Patient's Nurse   Connye Burkitt Patient Access.

## 2019-10-15 NOTE — ED Notes (Signed)
Pt discharged to BMU at 0927; care handhoff to [facility]; 3 bags of belongings sent down (one smaller bag was placed inside a larger bag)

## 2019-10-15 NOTE — Plan of Care (Signed)

## 2019-10-15 NOTE — H&P (Signed)
Psychiatric Admission Assessment Adult  Patient Identification: Marcia West MRN:  [id number] Date of Evaluation:  10/15/2019 Chief Complaint:  Bipolar disorder, current episode manic without psychotic features (HCC) [F31.10] Principal Diagnosis: <principal problem not specified> Diagnosis:  Active Problems:   Bipolar disorder, current episode manic without psychotic features (HCC)    Polysubstance  Dependence MJ dependence ----UDS positive   Sister brought to ER  Lives with Mom      History of Present Illness:   As above sister brought her in for mania with psychosis ---off meds.  Speedy in all directions, labile up and down highs and lows, not making sense --illogical disorganized thought ----speeded actions lack of sleep ----impulsivity Mixed with generalized anxiety and depression   Long history of opioid dependence suboxone restarted as she feels she is going to withdrawal  Missed appointment last week  UDS positive for MJ   Remains on IVC due mainly to risk of clinical deterioration.   Not active suicidal or homicidal          Associated Signs/Symptoms: Depression Symptoms:    Listed above and below Including depressed mood crying spells lack of motivation, energy concentration weight loss anhedonia ----  PTSD --not clear  Psychosis --paranoid, hears voices delusional --people following and doing lab experiments  Anxiety    Generalized anxiety         Past Psychiatric History:   2 previous admissions Including this one   Last hospital [facility] --two years ago   Best Medinces ----Prozac 60 daily  Klonopin     [facility] --missed your appointment   Previously ---opioids,  --no IV needle drugs  Still remains on IVC ---  Social History ---Marcia West at [employer] Three kids ---67 year old, / 13 and 22 year old   Dad in [place]  --lives with [employer] and schooling ---associates Engineer, maintenance and Legal issues --Court  ordered meds during pregnancy                Is the patient at risk to self? Yes.    Has the patient been a risk to self in the past 6 months? No.  Has the patient been a risk to self within the distant past? Yes.    Is the patient a risk to others? No.  Has the patient been a risk to others in the past 6 months? No.  Has the patient been a risk to others within the distant past? No.   Prior Inpatient Therapy:   Prior Outpatient Therapy:    Alcohol Screening:   Substance Abuse History in the last 12 months:  Yes.   Consequences of Substance Abuse: See above  Previous Psychotropic Medications: yes    Psychological Evaluations:  Yes  Past Medical History:  Past Medical History:  Diagnosis Date  . Anxiety   . Bipolar affective (HCC)   . Depression   . Herpes simplex type II infection   . Substance abuse [facility])     Past Surgical History:  Procedure Laterality Date  . NO PAST SURGERIES    . WISDOM TOOTH EXTRACTION     Family History:  Family History  Problem Relation Age of Onset  . Arthritis Maternal Grandmother   . Diabetes Maternal Grandmother   . Diabetes Maternal Grandfather   . Obesity Paternal Grandmother    Family Psychiatric  History:    Mom and Dad with depression  Tobacco Screening:  smokers 1ppd  And MJ   Social History:  Social History   Substance and Sexual Activity  Alcohol Use Not Currently     Social History   Substance and Sexual Activity  Drug Use Not Currently  . Types: Methamphetamines, Other-see comments, Marijuana, "Crack" cocaine   Comment: suboxone daily    Additional Social History:         39 and 73 ----year old in [place]  She has 66 year old    With sister                    Allergies:  No Known Allergies Lab Results:  Results for orders placed or performed during the hospital encounter of 10/14/19 (from the past 48 hour(s))  Comprehensive metabolic panel     Status: Abnormal   Collection Time:  10/14/19  9:13 PM  Result Value Ref Range   Sodium 137 135 - 145 mmol/L   Potassium 3.9 3.5 - 5.1 mmol/L   Chloride 103 98 - 111 mmol/L   CO2 23 22 - 32 mmol/L   Glucose, Bld 119 (H) 70 - 99 mg/dL    Comment: Glucose reference range applies only to samples taken after fasting for at least 8 hours.   BUN 15 6 - 20 mg/dL   Creatinine, Ser 3.82 0.44 - 1.00 mg/dL   Calcium 9.4 8.9 - 50.5 mg/dL   Total Protein 8.4 (H) 6.5 - 8.1 g/dL   Albumin 5.1 (H) 3.5 - 5.0 g/dL   AST 22 15 - 41 U/L   ALT 15 0 - 44 U/L   Alkaline Phosphatase 42 38 - 126 U/L   Total Bilirubin 0.8 0.3 - 1.2 mg/dL   GFR calc non Af Amer >60 >60 mL/min   GFR calc Af Amer >60 >60 mL/min   Anion gap 11 5 - 15    Comment: Performed at [facility], [address] [place], [place] [address]  Ethanol     Status: None   Collection Time: 10/14/19  9:13 PM  Result Value Ref Range   Alcohol, Ethyl (B) <10 <10 mg/dL    Comment: (NOTE) Lowest detectable limit for serum alcohol is 10 mg/dL.  For medical purposes only. Performed at [facility], [address] [place], [place] [address]   Salicylate level     Status: Abnormal   Collection Time: 10/14/19  9:13 PM  Result Value Ref Range   Salicylate Lvl <7.0 (L) 7.0 - 30.0 mg/dL    Comment: Performed at [facility], [address] [place], [place] [address]  Acetaminophen level     Status: Abnormal   Collection Time: 10/14/19  9:13 PM  Result Value Ref Range   Acetaminophen (Tylenol), Serum <10 (L) 10 - 30 ug/mL    Comment: (NOTE) Therapeutic concentrations vary significantly. A range of 10-30 ug/mL  may be an effective concentration for many patients. However, some  are best treated at concentrations outside of this range. Acetaminophen concentrations >150 ug/mL at 4 hours after ingestion  and >50 ug/mL at 12 hours after ingestion are often associated with  toxic reactions.  Performed at [facility], [address]  [place], [place] [address]   cbc     Status: None   Collection Time: 10/14/19  9:13 PM  Result Value Ref Range   WBC 9.6 4.0 - 10.5 K/uL   RBC 4.68 3.87 - 5.11 MIL/uL   Hemoglobin 15.0 12.0 - 15.0 g/dL   HCT 42.2  36 - 46 %   MCV 90.2 80.0 - 100.0 fL   MCH 32.1 26.0 - 34.0 pg   MCHC 35.5 30.0 - 36.0 g/dL   RDW 32.4 40.1 - 02.7 %   Platelets 249 150 - 400 K/uL   nRBC 0.0 0.0 - 0.2 %    Comment: Performed at [facility], [address], [place], [place] [address]  Urine Drug Screen, Qualitative     Status: Abnormal   Collection Time: 10/14/19  9:13 PM  Result Value Ref Range   Tricyclic, Ur Screen NONE DETECTED NONE DETECTED   Amphetamines, Ur Screen NONE DETECTED NONE DETECTED   MDMA (Ecstasy)Ur Screen NONE DETECTED NONE DETECTED   Cocaine Metabolite,Ur [place] NONE DETECTED NONE DETECTED   Opiate, Ur Screen NONE DETECTED NONE DETECTED   Phencyclidine (PCP) Ur S NONE DETECTED NONE DETECTED   Cannabinoid 50 Ng, Ur [place] POSITIVE (A) NONE DETECTED   Barbiturates, Ur Screen NONE DETECTED NONE DETECTED   Benzodiazepine, Ur Scrn NONE DETECTED NONE DETECTED   Methadone Scn, Ur NONE DETECTED NONE DETECTED    Comment: (NOTE) Tricyclics + metabolites, urine    Cutoff 1000 ng/mL Amphetamines + metabolites, urine  Cutoff 1000 ng/mL MDMA (Ecstasy), urine              Cutoff 500 ng/mL Cocaine Metabolite, urine          Cutoff 300 ng/mL Opiate + metabolites, urine        Cutoff 300 ng/mL Phencyclidine (PCP), urine         Cutoff 25 ng/mL Cannabinoid, urine                 Cutoff 50 ng/mL Barbiturates + metabolites, urine  Cutoff 200 ng/mL Benzodiazepine, urine              Cutoff 200 ng/mL Methadone, urine                   Cutoff 300 ng/mL  The urine drug screen provides only a preliminary, unconfirmed analytical test result and should not be used for non-medical purposes. Clinical consideration and professional judgment should be applied to any positive drug screen result due to  possible interfering substances. A more specific alternate chemical method must be used in order to obtain a confirmed analytical result. Gas chromatography / mass spectrometry (GC/MS) is the preferred confirm atory method. Performed at [facility], [address] [place], [place] [address]   Pregnancy, urine POC     Status: None   Collection Time: 10/14/19  9:28 PM  Result Value Ref Range   Preg Test, Ur NEGATIVE NEGATIVE    Comment:        THE SENSITIVITY OF THIS METHODOLOGY IS >24 mIU/mL   Respiratory Panel by RT PCR (Flu A&B, Covid) - Nasopharyngeal Swab     Status: None   Collection Time: 10/15/19  3:18 AM   Specimen: Nasopharyngeal Swab  Result Value Ref Range   SARS Coronavirus 2 by RT PCR NEGATIVE NEGATIVE    Comment: (NOTE) SARS-CoV-2 target nucleic acids are NOT DETECTED.  The SARS-CoV-2 RNA is generally detectable in upper respiratoy specimens during the acute phase of infection. The lowest concentration of SARS-CoV-2 viral copies this assay can detect is 131 copies/mL. A negative result does not preclude SARS-Cov-2 infection and should not be used as the sole basis for treatment or other patient management decisions. A negative result may occur with  improper specimen collection/handling, submission of specimen other  than nasopharyngeal swab, presence of viral mutation(s) within the areas targeted by this assay, and inadequate number of viral copies (<131 copies/mL). A negative result must be combined with clinical observations, patient history, and epidemiological information. The expected result is Negative.  Fact Sheet for Patients:  https://www.moore.com/https://www.fda.gov/media/142436/download  Fact Sheet for Healthcare Providers:  https://www.young.biz/https://www.fda.gov/media/142435/download  This test is no t yet approved or cleared by the [place] FDA and  has been authorized for detection and/or diagnosis of SARS-CoV-2 by FDA under an Emergency Use Authorization (EUA). This  EUA will remain  in effect (meaning this test can be used) for the duration of the COVID-19 declaration under Section 564(b)(1) of the Act, 21 U.S.C. section 360bbb-3(b)(1), unless the authorization is terminated or revoked sooner.     Influenza A by PCR NEGATIVE NEGATIVE   Influenza B by PCR NEGATIVE NEGATIVE    Comment: (NOTE) The Xpert Xpress SARS-CoV-2/FLU/RSV assay is intended as an aid in  the diagnosis of influenza from Nasopharyngeal swab specimens and  should not be used as a sole basis for treatment. Nasal washings and  aspirates are unacceptable for Xpert Xpress SARS-CoV-2/FLU/RSV  testing.  Fact Sheet for Patients: https://www.moore.com/https://www.fda.gov/media/142436/download  Fact Sheet for Healthcare Providers: https://www.young.biz/https://www.fda.gov/media/142435/download  This test is not yet approved or cleared by the [place] FDA and  has been authorized for detection and/or diagnosis of SARS-CoV-2 by  FDA under an Emergency Use Authorization (EUA). This EUA will remain  in effect (meaning this test can be used) for the duration of the  Covid-19 declaration under Section 564(b)(1) of the Act, 21  U.S.C. section 360bbb-3(b)(1), unless the authorization is  terminated or revoked. Performed at [facility], [address] [place], [place] [address]     Blood Alcohol level:  Lab Results  Component Value Date   [facility]10 10/14/2019    Metabolic Disorder Labs:  No results found for: HGBA1C, MPG No results found for: PROLACTIN No results found for: CHOL, TRIG, HDL, CHOLHDL, VLDL, LDLCALC  Current Medications: Current Facility-Administered Medications  Medication Dose Route Frequency Provider Last Rate Last Admin  . acetaminophen (TYLENOL) tablet 650 mg  650 mg Oral Q6H PRN Dixon, Rashaun M, NP      . alum & mag hydroxide-simeth (MAALOX/MYLANTA) 200-200-20 MG/5ML suspension 30 mL  30 mL Oral Q4H PRN Dixon, Rashaun M, NP      . magnesium hydroxide (MILK OF MAGNESIA) suspension 30 mL   30 mL Oral Daily PRN Dixon, Rashaun M, NP      . traZODone (DESYREL) tablet 50 mg  50 mg Oral QHS PRN Jearld Leschixon, Rashaun M, NP       PTA Medications: Medications Prior to Admission  Medication Sig Dispense Refill Last Dose  . Buprenorphine HCl-Naloxone HCl 4-1 MG FILM Take 1/2 film daily SL. 7 each 0   . clonazePAM (KLONOPIN) 0.5 MG tablet Take 1 tablet (0.5 mg total) by mouth 2 (two) times daily for 30 doses. 30 tablet 0   . FLUoxetine (PROZAC) 20 MG capsule TAKE 3 CAPSULES (60 MG) BY MOUTH DAILY. 270 capsule 3   . norethindrone (MICRONOR,CAMILA,ERRIN) 0.35 MG tablet Take 1 tablet (0.35 mg total) by mouth daily. 1 Package 11   . traZODone (DESYREL) 50 MG tablet TAKE 1/2 TO 1 TABLETS (25-50 MG TOTAL) BY MOUTH AT BEDTIME. 30 tablet 2     Musculoskeletal: Strength & Muscle Tone:  Gait & Station: normal  Patient leans:   Psychiatric Specialty Exam: Physical Exam  Review of Systems  Blood pressure (!) 141/107, pulse 90, temperature 98.6 F (37 C), temperature source Oral, resp. rate 16, height  (1.575 m), weight 52.2 kg, currently breastfeeding.Body mass index is 21.03 kg/m.   Mental Status Appearance  Looks tired not kept  Alert cooperative very manic Oriented times four  Consciousness not clouded or fluctuant Concentration and attention poor No shakes tics and tremors for movements Thought process and content ---very speeded, racing FOI   Illogical thoughts ---OCD thoughts Memory remote and recent okay immediate okay   Had been paranoid, delusional were she thought people  Were experimenting on her And other paranoid materials Fund of knowledge and intelligence normal SI and HI none Abstraction okay   Speech  She interrupts is very speeded --hard to engage in this state Anxious ---mood elevated and edgy                                                           Insight ---poor Akathisia --none Handedness none Aims not done yet Assets  caring family  ADL's---lower when manic Cognition impaired during mania  Sleep   Erratic  Psychomotor very elevated Language speedy  English    Treatment Plan Summary:   Caucasian female with mania and psychosis off meds and now in florid manic state  Meds restarted  --older used because atypicals she said do not make her well   Observation Level/Precautions:  q15  Laboratory:  UDS positive for MJ  Psychotherapy:    Medications:    Consultations:    Discharge Concerns:    Estimated LOS:  Other:     Physician Treatment Plan for Primary Diagnosis: <principal problem not specified>  Long and short term goals  Reduce symptoms, compliance, find Bipolar support in groups here and outside  --possible long acting meds Better child support ----group therapy support     I certify that inpatient services furnished can reasonably be expected to improve the patient's condition.    Roselind Messier, MD 10/2/202111:39 AM

## 2019-10-15 NOTE — Progress Notes (Signed)
Patient less intrusive. Medication compliant. Denies SI, HI and AVH.

## 2019-10-15 NOTE — Progress Notes (Signed)
  ADMISSION DAR NOTE:   Pt transferred from [facility] ED on IVC status. Presents with Rapid speech, sullen and anxious affect, and labile mood alert and oriented x 4  Denies SI/HI/A/VH. Stated that she has been struggling with anxiety and depression and insomnia for the past 3 months and needed help at the ED, but did not plan to be admitted. Patient stated that " I moved from [place] 2 years ago because I struggled with drugs I had been abusing a lot of different drugs, but now I only take marijuana most of the days and I am on subutex and klonopin. I want help to stop taking the subutex" Marcia West stated that she lives with her mother and her sister has her 60 year old baby. She also has two children in [place] staying with their father because she could not take care of them due to her Opoid abuse. She reports feelings of jittery and forgetfulness. She said she would like to get better and be able to take care of her kids.   Skin assessment done and noted to have a small scar on her left dorsal palm she said she bumped into a metal drawer. Patient oriented to unit and belonging searched per protocol.   Support and encouragement provided and Q15. minute safety checks started without self harm gestures.

## 2019-10-15 NOTE — ED Notes (Signed)
Report to include Situation, Background, Assessment, and Recommendations received from Ann RN. Patient alert and oriented, warm and dry, in no acute distress. Patient denies SI, HI, AVH and pain. Patient made aware of Q15 minute rounds and Rover and Officer presence for their safety. Patient instructed to come to me with needs or concerns.  

## 2019-10-15 NOTE — ED Notes (Signed)
Pt's sister Lillia Abed called.  She was informed that pt can call her after 9 am.  [phone]

## 2019-10-15 NOTE — [facility] Group Notes (Signed)
[facility] LCSW Group Therapy  10/15/2019 2:26 PM   Group Topic: The focus of this group is to identify areas and triggers of stress and discover ways to eleviate and manage stress.  Type of Therapy:  Psychoeducational Skills  Participation Level:  Minimal  Participation Quality:  Appropriate and Sharing  Affect:  Anxious  Cognitive:  Disorganized  Insight:  Developing/Improving  Engagement in Therapy:  Limited  Modes of Intervention:  Discussion, Education and Exploration  Summary of Progress/Problems: This patient arrived late to group and was in and out of group during group session. This patient was able to share appropriately, but stated that she felt stuck in her head and struggled to verbalize her thoughts and feelings.   Marcia West 10/15/2019, 2:26 PM

## 2019-10-15 NOTE — Consult Note (Signed)
[facility]   Reason for Consult:  Psych evaluation   Referring Physician:   Patient Identification: Marcia West Vought MRN:  [id number] Principal Diagnosis: Mania [facility](HCC) Diagnosis:  Principal Problem:   Mania (HCC)   Total Time spent with patient: 45 minutes  HPI:  Per TTS: Marcia West Hankey is an 34 y.o. female presenting to [facility] ED intially voluntary but has since been IVC'd. Per triage note Pt states is here for a psych evaluation "to see a psychiatrist". Pt states she wants to "stop forgetting everything". Pt states she feels jittery, denies SI or HI. During assessment patient appears alert and oriented x4, cooperative but speech is rapid and patient appears manic. During assessment patient has to be redirected back to certain questions as patient. When asked why patient was presenting to the ED patient reported "I can't stop forgetting or remember things, I'm a little paranoid and I don't know why." "I'm having a hard time calming myself down and I have anxiety, I'm not taking care of myself." Patient reported difficulty sleeping and reports that she hasn't slept "in 3 months." Patient reports that she currently feels manic and after this phase "I get depressed after this." Patient reports currently receiving [facility] "I get my suboxone, Clonopin and Prozac there." Patient reported currently being on Suboxone due to a history of Opioid use. Patient reports that she hasn't used Opiates in 2 years. Patient does report that she uses marijuana, patient reports her last date of use being "last night." Patient UDS is positive for Marijuana. Patient reports mental health diagnosis of "Anxiety, depression, and Bipolar." Patient denies SI/HI/AH/VH and does not appear to be responding to any internal or external stimuli.  Past Psychiatric History:   Risk to Self: Suicidal Ideation: No Suicidal Intent: No Is patient at risk for suicide?:  No Suicidal Plan?: No Access to Means: No What has been your use of drugs/alcohol within the last 12 months?: Marijuana, History of Opioid use How many times?: 0 Other Self Harm Risks: None Triggers for Past Attempts: None known Intentional Self Injurious Behavior: None Risk to Others: Homicidal Ideation: No Thoughts of Harm to Others: No Current Homicidal Intent: No Current Homicidal Plan: No Access to Homicidal Means: No Identified Victim: None History of harm to others?: No Assessment of Violence: None Noted Violent Behavior Description: None Does patient have access to weapons?: No Criminal Charges Pending?: No Does patient have a court date: No Prior Inpatient Therapy: Prior Inpatient Therapy: Yes Prior Therapy Dates: Unknown Prior Therapy Facilty/Provider(s): Unknown Reason for Treatment: Substance Abuse Prior Outpatient Therapy: Prior Outpatient Therapy: Yes Prior Therapy Dates: Currently Prior Therapy Facilty/Provider(s): Internal Medicine Reason for Treatment: Substance Abuse, Depression, Anxiety Does patient have an ACCT team?: No Does patient have Intensive In-House Services?  : No Does patient have [facility] services? : No Does patient have P4CC services?: No  Past Medical History:  Past Medical History:  Diagnosis Date  . Anxiety   . Bipolar affective (HCC)   . Depression   . Herpes simplex type II infection   . Substance abuse [facility](HCC)     Past Surgical History:  Procedure Laterality Date  . NO PAST SURGERIES    . WISDOM TOOTH EXTRACTION     Family History:  Family History  Problem Relation Age of Onset  . Arthritis Maternal Grandmother   . Diabetes Maternal Grandmother   . Diabetes Maternal Grandfather   . Obesity Paternal Grandmother    Family Psychiatric  History: unknown Social History:  Social History   Substance and Sexual Activity  Alcohol Use Not Currently     Social History   Substance and Sexual Activity  Drug Use Not Currently  .  Types: Methamphetamines, Other-see comments, Marijuana, "Crack" cocaine   Comment: suboxone daily    Social History   Socioeconomic History  . Marital status: Single    Spouse name: Not on file  . Number of children: Not on file  . Years of education: Not on file  . Highest education level: Not on file  Occupational History  . Not on file  Tobacco Use  . Smoking status: Current Every Day Smoker    Packs/day: 0.25    Types: Cigarettes  . Smokeless tobacco: Never Used  . Tobacco comment: 4 cigs per day  Vaping Use  . Vaping Use: Never used  Substance and Sexual Activity  . Alcohol use: Not Currently  . Drug use: Not Currently    Types: Methamphetamines, Other-see comments, Marijuana, "Crack" cocaine    Comment: suboxone daily  . Sexual activity: Not Currently    Birth control/protection: None  Other Topics Concern  . Not on file  Social History Narrative  . Not on file   Social Determinants of Health   Financial Resource Strain:   . Difficulty of Paying Living Expenses: Not on file  Food Insecurity:   . Worried About Programme researcher, broadcasting/film/video in the Last Year: Not on file  . Ran Out of Food in the Last Year: Not on file  Transportation Needs:   . Lack of Transportation (Medical): Not on file  . Lack of Transportation (Non-Medical): Not on file  Physical Activity:   . Days of Exercise per Week: Not on file  . Minutes of Exercise per Session: Not on file  Stress:   . Feeling of Stress : Not on file  Social Connections:   . Frequency of Communication with Friends and Family: Not on file  . Frequency of Social Gatherings with Friends and Family: Not on file  . Attends Religious Services: Not on file  . Active Member of Clubs or Organizations: Not on file  . Attends Banker Meetings: Not on file  . Marital Status: Not on file   Additional Social History:    Allergies:  No Known Allergies  Labs:  Results for orders placed or performed during the hospital  encounter of 10/14/19 (from the past 48 hour(s))  Comprehensive metabolic panel     Status: Abnormal   Collection Time: 10/14/19  9:13 PM  Result Value Ref Range   Sodium 137 135 - 145 mmol/L   Potassium 3.9 3.5 - 5.1 mmol/L   Chloride 103 98 - 111 mmol/L   CO2 23 22 - 32 mmol/L   Glucose, Bld 119 (H) 70 - 99 mg/dL    Comment: Glucose reference range applies only to samples taken after fasting for at least 8 hours.   BUN 15 6 - 20 mg/dL   Creatinine, Ser 1.88 0.44 - 1.00 mg/dL   Calcium 9.4 8.9 - 41.6 mg/dL   Total Protein 8.4 (H) 6.5 - 8.1 g/dL   Albumin 5.1 (H) 3.5 - 5.0 g/dL   AST 22 15 - 41 U/L   ALT 15 0 - 44 U/L   Alkaline Phosphatase 42 38 - 126 U/L   Total Bilirubin 0.8 0.3 - 1.2 mg/dL   GFR calc non Af Amer >60 >60 mL/min   GFR calc Af Amer >  60 >60 mL/min   Anion gap 11 5 - 15    Comment: Performed at [facility], [address] [place], [place] [address]  Ethanol     Status: None   Collection Time: 10/14/19  9:13 PM  Result Value Ref Range   Alcohol, Ethyl (B) <10 <10 mg/dL    Comment: (NOTE) Lowest detectable limit for serum alcohol is 10 mg/dL.  For medical purposes only. Performed at [facility], [address] [place], [place] [address]   Salicylate level     Status: Abnormal   Collection Time: 10/14/19  9:13 PM  Result Value Ref Range   Salicylate Lvl <7.0 (L) 7.0 - 30.0 mg/dL    Comment: Performed at [facility], [address] [place], [place] [address]  Acetaminophen level     Status: Abnormal   Collection Time: 10/14/19  9:13 PM  Result Value Ref Range   Acetaminophen (Tylenol), Serum <10 (L) 10 - 30 ug/mL    Comment: (NOTE) Therapeutic concentrations vary significantly. A range of 10-30 ug/mL  may be an effective concentration for many patients. However, some  are best treated at concentrations outside of this range. Acetaminophen concentrations >150 ug/mL at 4 hours after ingestion  and >50 ug/mL at 12 hours  after ingestion are often associated with  toxic reactions.  Performed at [facility], [address] [place], [place] [address]   cbc     Status: None   Collection Time: 10/14/19  9:13 PM  Result Value Ref Range   WBC 9.6 4.0 - 10.5 K/uL   RBC 4.68 3.87 - 5.11 MIL/uL   Hemoglobin 15.0 12.0 - 15.0 g/dL   HCT 96.2 36 - 46 %   MCV 90.2 80.0 - 100.0 fL   MCH 32.1 26.0 - 34.0 pg   MCHC 35.5 30.0 - 36.0 g/dL   RDW 83.6 62.9 - 47.6 %   Platelets 249 150 - 400 K/uL   nRBC 0.0 0.0 - 0.2 %    Comment: Performed at [facility], [address], [place], [place] [address]  Urine Drug Screen, Qualitative     Status: Abnormal   Collection Time: 10/14/19  9:13 PM  Result Value Ref Range   Tricyclic, Ur Screen NONE DETECTED NONE DETECTED   Amphetamines, Ur Screen NONE DETECTED NONE DETECTED   MDMA (Ecstasy)Ur Screen NONE DETECTED NONE DETECTED   Cocaine Metabolite,Ur [place] NONE DETECTED NONE DETECTED   Opiate, Ur Screen NONE DETECTED NONE DETECTED   Phencyclidine (PCP) Ur S NONE DETECTED NONE DETECTED   Cannabinoid 50 Ng, Ur [place] POSITIVE (A) NONE DETECTED   Barbiturates, Ur Screen NONE DETECTED NONE DETECTED   Benzodiazepine, Ur Scrn NONE DETECTED NONE DETECTED   Methadone Scn, Ur NONE DETECTED NONE DETECTED    Comment: (NOTE) Tricyclics + metabolites, urine    Cutoff 1000 ng/mL Amphetamines + metabolites, urine  Cutoff 1000 ng/mL MDMA (Ecstasy), urine              Cutoff 500 ng/mL Cocaine Metabolite, urine          Cutoff 300 ng/mL Opiate + metabolites, urine        Cutoff 300 ng/mL Phencyclidine (PCP), urine         Cutoff 25 ng/mL Cannabinoid, urine                 Cutoff 50 ng/mL Barbiturates + metabolites, urine  Cutoff 200 ng/mL Benzodiazepine, urine  Cutoff 200 ng/mL Methadone, urine                   Cutoff 300 ng/mL  The urine drug screen provides only a preliminary, unconfirmed analytical test result and should not be used for  non-medical purposes. Clinical consideration and professional judgment should be applied to any positive drug screen result due to possible interfering substances. A more specific alternate chemical method must be used in order to obtain a confirmed analytical result. Gas chromatography / mass spectrometry (GC/MS) is the preferred confirm atory method. Performed at [facility], [address] [place], [place] [address]   Pregnancy, urine POC     Status: None   Collection Time: 10/14/19  9:28 PM  Result Value Ref Range   Preg Test, Ur NEGATIVE NEGATIVE    Comment:        THE SENSITIVITY OF THIS METHODOLOGY IS >24 mIU/mL     Current Facility-Administered Medications  Medication Dose Route Frequency Provider Last Rate Last Admin  . traZODone (DESYREL) tablet 50 mg  50 mg Oral QHS Sharman Cheek, MD   50 mg at 10/14/19 2332   Current Outpatient Medications  Medication Sig Dispense Refill  . Buprenorphine HCl-Naloxone HCl 4-1 MG FILM Take 1/2 film daily SL. 7 each 0  . clonazePAM (KLONOPIN) 0.5 MG tablet Take 1 tablet (0.5 mg total) by mouth 2 (two) times daily for 30 doses. 30 tablet 0  . FLUoxetine (PROZAC) 20 MG capsule TAKE 3 CAPSULES (60 MG) BY MOUTH DAILY. 270 capsule 3  . norethindrone (MICRONOR,CAMILA,ERRIN) 0.35 MG tablet Take 1 tablet (0.35 mg total) by mouth daily. 1 Package 11  . traZODone (DESYREL) 50 MG tablet TAKE 1/2 TO 1 TABLETS (25-50 MG TOTAL) BY MOUTH AT BEDTIME. 30 tablet 2    Musculoskeletal: Strength & Muscle Tone: within normal limits Gait & Station: normal Patient leans: N/A  Psychiatric Specialty Exam: Physical Exam Vitals and nursing note reviewed.  HENT:     Head: Normocephalic and atraumatic.     Nose: Nose normal.  Cardiovascular:     Rate and Rhythm: Normal rate.     Pulses: Normal pulses.  Pulmonary:     Effort: Pulmonary effort is normal.  Musculoskeletal:        General: Normal range of motion.     Cervical back: Normal  range of motion.  Skin:    General: Skin is warm and dry.  Neurological:     General: No focal deficit present.     Mental Status: She is alert and oriented to person, place, and time.  Psychiatric:        Attention and Perception: She is inattentive.        Mood and Affect: Mood is anxious, depressed and elated.        Speech: Speech is rapid and pressured.        Behavior: Behavior is hyperactive.        Cognition and Memory: Cognition is impaired. Memory is impaired.        Judgment: Judgment is impulsive and inappropriate.     Review of Systems  Psychiatric/Behavioral: Positive for behavioral problems and dysphoric mood. The patient is nervous/anxious and is hyperactive.   All other systems reviewed and are negative.   Blood pressure (!) 156/99, pulse 79, temperature 99 F (37.2 C), temperature source Oral, resp. rate 16, height 5\' 2"  (1.575 m), weight 52.2 kg, SpO2 100 %, currently breastfeeding.Body mass index is 21.03 kg/m.  General  Appearance: Bizarre  Eye Contact:  Minimal  Speech:  Pressured  Volume:  Decreased  Mood:  Anxious  Affect:  Full Range  Thought Process:  Disorganized  Orientation:  Full (Time, Place, and Person)  Thought Content:  Obsessions  Suicidal Thoughts:  No  Homicidal Thoughts:  No  Memory:  Remote;   Poor  Judgement:  Impaired  Insight:  Lacking  Psychomotor Activity:  Increased  Concentration:  Attention Span: Poor  Recall:  Fair  Fund of Knowledge:  Poor  Language:  Poor  Akathisia:  No  Handed:  Right  AIMS (if indicated):     Assets:  Communication Skills  ADL's:  Intact  Cognition:  Impaired,  Mild  Sleep:   Havent slept in two weeks     Treatment Plan Summary: Daily contact with patient to assess and evaluate symptoms and progress in treatment and Medication management  Disposition: Recommend psychiatric Inpatient admission when medically cleared. Supportive therapy provided about ongoing stressors.  Jearld Lesch,  NP 10/15/2019 4:13 AM

## 2019-10-15 NOTE — [facility] Suicide Risk Assessment (Signed)
[facility] Admission Suicide Risk Assessment   Nursing information obtained from:  Patient Demographic factors:  Adolescent or young adult, Caucasian, Low socioeconomic status Current Mental Status:  NA Loss Factors:  NA Historical Factors:  Impulsivity Risk Reduction Factors:  Responsible for children under 33 years of age, Living with another person, especially a relative, Positive social support  Total Time spent with patient:  45 minutes   Principal Problem: --acute mania  Diagnosis:  Bipolar manic with psychosis  Subjective Data:   Continued Clinical Symptoms:    The "Alcohol Use Disorders Identification Test", Guidelines for Use in Primary Care, Second Edition.  World Science writer [facility]). Score between 0-7:  no or low risk or alcohol related problems. Score between 8-15:  moderate risk of alcohol related problems. Score between 16-19:  high risk of alcohol related problems. Score 20 or above:  warrants further diagnostic evaluation for alcohol dependence and treatment.   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   Roselind Messier, MD 10/15/2019, 12:06 PM

## 2019-10-15 NOTE — ED Notes (Signed)
Hourly rounding reveals patient sitting in bed talking on phone to her sister.  No complaints, stable, in no acute distress. Q15 minute rounds and monitoring via Psychologist, counselling to continue.

## 2019-10-15 NOTE — ED Notes (Signed)
Hourly rounding reveals patient resting in hall bed. No complaints, stable, in no acute distress. Q15 minute rounds and monitoring via Psychologist, counselling to continue.  Pt was informed that she will be admitted.  Pt requested to use the phone and was informed of phone times.
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
## 2019-10-16 MED ORDER — CLONAZEPAM 0.25 MG PO TBDP
0.7500 mg | ORAL_TABLET | Freq: Two times a day (BID) | ORAL | Status: DC
  Administered 2019-10-16 – 2019-10-17 (×2): 0.75 mg via ORAL
  Filled 2019-10-16 (×2): qty 3

## 2019-10-16 MED ORDER — INFLUENZA VAC SPLIT QUAD 0.5 ML IM SUSY
0.5000 mL | PREFILLED_SYRINGE | INTRAMUSCULAR | Status: AC
  Administered 2019-10-17: 0.5 mL via INTRAMUSCULAR
  Filled 2019-10-16: qty 0.5

## 2019-10-16 MED ORDER — THIOTHIXENE 2 MG PO CAPS
4.0000 mg | ORAL_CAPSULE | Freq: Two times a day (BID) | ORAL | Status: DC
  Administered 2019-10-16 – 2019-10-17 (×2): 4 mg via ORAL
  Filled 2019-10-16 (×3): qty 2

## 2019-10-16 NOTE — BHH Group Notes (Signed)
LCSW Aftercare Discharge Planning Group Note   10/16/2019 1:19 -2:34 PM   Type of Group and Topic: Psychoeducational Group:  Discharge Planning  Participation Level:  Active  Description of Group  Discharge planning group reviews patient's anticipated discharge plans and assists patients to anticipate and address any barriers to wellness/recovery in the community.  Suicide prevention education is reviewed with patients in group.  Therapeutic Goals 1. Patients will state their anticipated discharge plan and mental health aftercare 2. Patients will identify potential barriers to wellness in the community setting 3. Patients will engage in problem solving, solution focused discussion of ways to anticipate and address barriers to wellness/recovery  Summary of Patient Progress: Patient checked into group feeling depressed and not feeling as good as she was yesterday. Patient spoke about not having the time to go outside and get fresh air. Patient felt being able to go outside would help with her treatment. Patient stated that she is interested in therapy when she is discharged. Patient also wanted to know more about the medication she is taking. Patient participated in creating a vision board for the future and plans to work on her treatment and spend time with her children.   Plan for Discharge/Comments: Patient plans on participating in therapy    Transportation Means: Patient recently bought a new car  Supports: Patient stated that her family is a huge support to her and her children   Therapeutic Modalities: Motivational Interviewing    [name], [place] 10/16/2019 3:36 PM

## 2019-10-16 NOTE — BHH Suicide Risk Assessment (Signed)
BHH INPATIENT:  Family/Significant Other Suicide Prevention Education  Suicide Prevention Education:  Education Completed; Duanne Guess, [phone]  (Mother) has been identified by the patient as the family member/significant other with whom the patient will be residing, and identified as the person(s) who will aid the patient in the event of a mental health crisis (suicidal ideations/suicide attempt).  With written consent from the patient, the family member/significant other has been provided the following suicide prevention education, prior to the and/or following the discharge of the patient.  The suicide prevention education provided includes the following:  Suicide risk factors  Suicide prevention and interventions  National Suicide Hotline telephone number  [facility] assessment telephone number  [facility] Emergency Assistance 911  [facility] and/or Residential Mobile Crisis Unit telephone number  Request made of family/significant other to:  Remove weapons (e.g., guns, rifles, knives), all items previously/currently identified as safety concern.    Remove drugs/medications (over-the-counter, prescriptions, illicit drugs), all items previously/currently identified as a safety concern.  The family member/significant other verbalizes understanding of the suicide prevention education information provided.  The family member/significant other agrees to remove the items of safety concern listed above.  Carollee Herter  Johonna Binette 10/16/2019, 4:17 PM

## 2019-10-16 NOTE — Progress Notes (Signed)
Patient has been calm, cooperative and pleasant. Says she feels she is ready to go home and hopes for discharge on Monday. Denies SI, HI and AVH

## 2019-10-16 NOTE — Progress Notes (Signed)
°   10/16/19 1200  Psych Admission Type (Psych Patients Only)  Admission Status Involuntary  Psychosocial Assessment  Patient Complaints Anxiety;Depression  Eye Contact Fair  Facial Expression Anxious  Affect Anxious  Speech Soft  Interaction Assertive  Motor Activity Restless  Appearance/Hygiene In scrubs  Behavior Characteristics Cooperative;Anxious  Mood Sad  Thought Process  Coherency WDL  Content WDL  Delusions None reported or observed  Perception WDL  Hallucination None reported or observed  Judgment Limited  Confusion Mild  Danger to Self  Current suicidal ideation? Denies  Danger to Others  Danger to Others None reported or observed  Pt is compliant with medications no adverse drug noted. Interacting well with Peers and staff stated that she is not happy being in th hospital. Support and encouragement provided as needed. Denies SI/HI will continue to monitor.

## 2019-10-16 NOTE — Progress Notes (Signed)
Patient ID: Marcia West, female   DOB: 01/18/1985, 34 y.o.   MRN: [id number]   Rama Candise Bowens MD Brief Sunday note ----extended done yesterday   She is very odd and strange but coming down now from very acute mania  She is less paranoid, is slowing down but has a ways to go.   Navane used and increased today ---had faster effect --she says previous standard atypicals did not work  Lithium needs gradual titration worked before  [employer] she had been on --regularly without abuse misuse  Titrated today  Prozac normally she takes 60 daily   She has no side effects or new medical   She has compliance problems and needs supportive outpatient women's groups, Bipolar NAMI meetings structured hobbies but she does work nights in stores  Moved from another city --sister and Mom keep up with her issues  Mom I called left message -----Sister yesterday did not know her recent issues   Alert cooperative oriented times four Rapport and eye contact okay  Very odd strange at baseline Thought process and content --very severe Manic issues but somewhat subsiding Consciousness not clouded or fluctuant Mood and affect not changed No shakes tics tremors No active SI or HI Abstraction limited Fund of knowledge and intelligence okay Concentration and attention distracted  Leans not known Musculoskeletal and gait and station about the same Recall fair Akathisia none Psychomotor slowing somewhat Language speedy somewhat  Assets  Caring Mom  ADL's not as good when manic Aims not done Sleep improving    Continues with current meds and plan awaiting stabilzation  Can benefit from Day treatment, women's groups NAMI meetings, service projects

## 2019-10-16 NOTE — Plan of Care (Signed)
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
## 2019-10-17 DIAGNOSIS — F3110 Bipolar disorder, current episode manic without psychotic features, unspecified: Principal | ICD-10-CM

## 2019-10-17 MED ORDER — CLONAZEPAM 0.25 MG PO TBDP
0.7500 mg | ORAL_TABLET | Freq: Two times a day (BID) | ORAL | Status: DC | PRN
  Administered 2019-10-17 – 2019-10-18 (×3): 0.75 mg via ORAL
  Filled 2019-10-17 (×3): qty 3

## 2019-10-17 MED ORDER — THIOTHIXENE 2 MG PO CAPS
2.0000 mg | ORAL_CAPSULE | Freq: Two times a day (BID) | ORAL | Status: DC
  Administered 2019-10-17 – 2019-10-18 (×2): 2 mg via ORAL
  Filled 2019-10-17 (×3): qty 1

## 2019-10-17 MED ORDER — ENSURE ENLIVE PO LIQD
237.0000 mL | Freq: Three times a day (TID) | ORAL | Status: DC
  Administered 2019-10-18: 237 mL via ORAL

## 2019-10-17 MED ORDER — FLUOXETINE HCL 20 MG PO CAPS
20.0000 mg | ORAL_CAPSULE | Freq: Every day | ORAL | Status: DC
  Administered 2019-10-18: 20 mg via ORAL
  Filled 2019-10-17: qty 1

## 2019-10-17 MED ORDER — BUPRENORPHINE HCL-NALOXONE HCL 2-0.5 MG SL SUBL
1.0000 | SUBLINGUAL_TABLET | Freq: Every day | SUBLINGUAL | Status: DC
  Administered 2019-10-18: 1 via SUBLINGUAL
  Filled 2019-10-17: qty 1

## 2019-10-17 NOTE — BHH Group Notes (Signed)
BHH Group Notes:  (Nursing/MHT/Case Management/Adjunct)  Date:  10/17/2019  Time:  9:44 PM  Type of Therapy:  Group Therapy  Participation Level:  Active  Participation Quality:  Appropriate  Affect:  Appropriate  Cognitive:  Alert  Insight:  Good  Engagement in Group:  Engaged and her goal is to understand her medicine.  Modes of Intervention:  Support  Summary of Progress/Problems:  Marcia West 10/17/2019, 9:44 PM

## 2019-10-17 NOTE — Progress Notes (Signed)
Recreation Therapy Notes  Date: 10/17/2019  Time: 9:30 am   Location: Courtyard   Behavioral response: Appropriate  Intervention Topic: Social Skills    Discussion/Intervention:  Group content on today was focused on social skills. The group defined social skills and identified ways they use social skills. Patients expressed what obstacles they face when trying to be social. Participants described the importance of social skills. The group listed ways to improve social skills and reasons to improve social skills. Individuals had an opportunity to learn new and improve social skills as well as identify their weaknesses.  Clinical Observations/Feedback: Patient came to group and was able to engage with peers and staff. Individual was social with peers and staff while participating in the intervention.  Anylah Scheib LRT/CTRS         Bernardo Brayman 10/17/2019 12:24 PM

## 2019-10-17 NOTE — [facility] Group Notes (Signed)
LCSW Group Therapy Note   10/17/2019 3:42 PM  Type of Therapy and Topic:  Group Therapy:  Overcoming Obstacles   Participation Level:  Active   Description of Group:    In this group patients will be encouraged to explore what they see as obstacles to their own wellness and recovery. They will be guided to discuss their thoughts, feelings, and behaviors related to these obstacles. The group will process together ways to cope with barriers, with attention given to specific choices patients can make. Each patient will be challenged to identify changes they are motivated to make in order to overcome their obstacles. This group will be process-oriented, with patients participating in exploration of their own experiences as well as giving and receiving support and challenge from other group members.   Therapeutic Goals: 1. Patient will identify personal and current obstacles as they relate to admission. 2. Patient will identify barriers that currently interfere with their wellness or overcoming obstacles.  3. Patient will identify feelings, thought process and behaviors related to these barriers. 4. Patient will identify two changes they are willing to make to overcome these obstacles:      Summary of Patient Progress Patient was present in group. Patient was preoccupied with discussing her grievances on the unit.  Patient expressed how she has been frustrated wit the rules and expectations.  CSW encouraged the patient to challenge how she looks at situations.  Patient reports feeling better following group.      Therapeutic Modalities:   Cognitive Behavioral Therapy Solution Focused Therapy Motivational Interviewing Relapse Prevention Therapy  [name], MSW, LCSW 10/17/2019 3:42 PM

## 2019-10-17 NOTE — Progress Notes (Signed)
[facility] MD Progress Note  10/17/2019 11:44 AM Marcia West  MRN:  [id number]   Subjective:  Marcia West was seen at bedside today. She states she is feeling better than admission, but is confused about her current medication regimen. Time spent going through medication list to state name, dose, indication, and possible side effects. Discussed plan to taper her off Prozac as this could be exacerbating her bipolar disorder. Discussed plan to taper off Navane and Artane as tolerated with goal of lithium as monotherapy for bipolar disorder. She is currently in a suboxone clinic, and working to taper this medication off completely. She also has a long-term goal to taper off Klonopin. She is able to verbalize symptoms of mania well- she does not sleep, has excess energy, starts many projects without finishing them, and calls people in the middle of the night when it isn't appropriate. In the past she would also increase drug use, but states she has not used anything but mariajuana in the last 3 years. She also reports history of depressive episodes where she is unable to get out of bed, experiences anhendonia, poor appetite, and poor concentration. She states she has been on numerous medications in the past, but can only recall a few. Specifically, she has been on Wellbutrin which induced hyperactivity and mania, Prozac which was not helpful, Seroquel which made her feel tired, Buspar ineffective at max dose, Abilify ineffective for mood stabilization and depression in the past.  Principal Problem: Bipolar disorder, current episode manic without psychotic features (HCC) Diagnosis: Principal Problem:   Bipolar disorder, current episode manic without psychotic features (HCC) Active Problems:   Opioid use disorder   Anxiety and depression   Insomnia secondary to anxiety  Total Time spent with patient: 1 hour  Past Psychiatric History: Two previous hospital admission. History of court ordered medications during  pregnancy. 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  She states she has been on numerous medications in the past, but can only recall a few. Specifically, she has been on Wellbutrin which induced hyperactivity and mania, Prozac which was not helpful, Seroquel which made her feel tired, Buspar ineffective at max dose, Abilify ineffective for mood stabilization and depression in the past.  Past Medical History:  Past Medical History:  Diagnosis Date  . Anxiety   . Bipolar affective (HCC)   . Depression   . Herpes simplex type II infection   . Substance abuse [facility])     Past Surgical History:  Procedure Laterality Date  . NO PAST SURGERIES    . WISDOM TOOTH EXTRACTION     Family History:  Family History  Problem Relation Age of Onset  . Arthritis Maternal Grandmother   . Diabetes Maternal Grandmother   . Diabetes Maternal Grandfather   . Obesity Paternal Grandmother    Family Psychiatric  History: mother and father with depression Social History:  Social History   Substance and Sexual Activity  Alcohol Use Not Currently     Social History   Substance and Sexual Activity  Drug Use Not Currently  . Types: Methamphetamines, Other-see comments, Marijuana, "Crack" cocaine   Comment: suboxone daily    Social History   Socioeconomic History  . Marital status: Single    Spouse name: Not on file  . Number of children: Not on file  . Years of education: Not on file  . Highest education level: Not on file  Occupational History  . Not on file  Tobacco Use  . Smoking status:  Current Every Day Smoker    Packs/day: 0.25    Types: Cigarettes  . Smokeless tobacco: Never Used  . Tobacco comment: 4 cigs per day  Vaping Use  . Vaping Use: Never used  Substance and Sexual Activity  . Alcohol use: Not Currently  . Drug use: Not Currently    Types: Methamphetamines, Other-see comments, Marijuana, "Crack" cocaine    Comment: suboxone daily  . Sexual activity: Not  Currently    Birth control/protection: None  Other Topics Concern  . Not on file  Social History Narrative  . Not on file   Social Determinants of Health   Financial Resource Strain:   . Difficulty of Paying Living Expenses: Not on file  Food Insecurity:   . Worried About Programme researcher, broadcasting/film/video in the Last Year: Not on file  . Ran Out of Food in the Last Year: Not on file  Transportation Needs:   . Lack of Transportation (Medical): Not on file  . Lack of Transportation (Non-Medical): Not on file  Physical Activity:   . Days of Exercise per Week: Not on file  . Minutes of Exercise per Session: Not on file  Stress:   . Feeling of Stress : Not on file  Social Connections:   . Frequency of Communication with Friends and Family: Not on file  . Frequency of Social Gatherings with Friends and Family: Not on file  . Attends Religious Services: Not on file  . Active Member of Clubs or Organizations: Not on file  . Attends Banker Meetings: Not on file  . Marital Status: Not on file   Additional Social History:     17 and 21 ----year old in [place]  She has 73 year old    With sister  Sleep: Fair  Appetite:  Poor  Current Medications: Current Facility-Administered Medications  Medication Dose Route Frequency Provider Last Rate Last Admin  . acetaminophen (TYLENOL) tablet 650 mg  650 mg Oral Q6H PRN Dixon, Rashaun M, NP      . alum & mag hydroxide-simeth (MAALOX/MYLANTA) 200-200-20 MG/5ML suspension 30 mL  30 mL Oral Q4H PRN Jearld Lesch, NP      . Melene Muller ON 10/18/2019] buprenorphine-naloxone (SUBOXONE) 2-0.5 mg per SL tablet 1 tablet  1 tablet Sublingual Daily Jesse Sans, MD      . clonazePAM Scarlette Calico) disintegrating tablet 0.75 mg  0.75 mg Oral BID PRN Jesse Sans, MD      . feeding supplement (ENSURE ENLIVE) (ENSURE ENLIVE) liquid 237 mL  237 mL Oral TID WC Jesse Sans, MD      . Melene Muller ON 10/18/2019] FLUoxetine (PROZAC) capsule 20 mg  20 mg  Oral Daily Jesse Sans, MD      . influenza vac split quadrivalent PF (FLUARIX) injection 0.5 mL  0.5 mL Intramuscular Prior to discharge Roselind Messier, MD      . influenza vac split quadrivalent PF (FLUARIX) injection 0.5 mL  0.5 mL Intramuscular Tomorrow-1000 Clapacs, John T, MD      . lithium carbonate capsule 150 mg  150 mg Oral BID WC Roselind Messier, MD   150 mg at 10/17/19 0733  . magnesium hydroxide (MILK OF MAGNESIA) suspension 30 mL  30 mL Oral Daily PRN Dixon, Rashaun M, NP      . nicotine (NICODERM CQ - dosed in mg/24 hours) patch 21 mg  21 mg Transdermal Daily Roselind Messier, MD   21 mg at 10/17/19 0735  .  thiothixene (NAVANE) capsule 4 mg  4 mg Oral BID Roselind Messierao, Ramakrishna, MD   4 mg at 10/17/19 0736  . traZODone (DESYREL) tablet 100 mg  100 mg Oral QHS Roselind Messierao, Ramakrishna, MD   100 mg at 10/16/19 2108  . traZODone (DESYREL) tablet 50 mg  50 mg Oral QHS PRN Dixon, Rashaun M, NP      . trihexyphenidyl (ARTANE) tablet 1 mg  1 mg Oral BID WC Roselind Messierao, Ramakrishna, MD   1 mg at 10/17/19 [id number]    Lab Results: No results found for this or any previous visit (from the past 48 hour(s)).  Blood Alcohol level:  Lab Results  Component Value Date   ETH <10 10/14/2019    Metabolic Disorder Labs: No results found for: HGBA1C, MPG No results found for: PROLACTIN No results found for: CHOL, TRIG, HDL, CHOLHDL, VLDL, LDLCALC  Physical Findings: AIMS: Facial and Oral Movements Muscles of Facial Expression: None, normal Lips and Perioral Area: None, normal Jaw: None, normal Tongue: None, normal,Extremity Movements Upper (arms, wrists, hands, fingers): None, normal Lower (legs, knees, ankles, toes): None, normal, Trunk Movements Neck, shoulders, hips: None, normal, Overall Severity Severity of abnormal movements (highest score from questions above): None, normal Incapacitation due to abnormal movements: None, normal Patient's awareness of abnormal movements (rate only patient's report):  No Awareness, Dental Status Current problems with teeth and/or dentures?: No Does patient usually wear dentures?: No  CIWA:   0 COWS:   0  Musculoskeletal: Strength & Muscle Tone: within normal limits Gait & Station: normal Patient leans: N/A  Psychiatric Specialty Exam: Physical Exam Constitutional:      Appearance: Normal appearance.  HENT:     Head: Normocephalic and atraumatic.     Right Ear: External ear normal.     Left Ear: External ear normal.     Nose: Nose normal.     Mouth/Throat:     Mouth: Mucous membranes are moist.     Pharynx: Oropharynx is clear.  Eyes:     Extraocular Movements: Extraocular movements intact.     Pupils: Pupils are equal, round, and reactive to light.  Cardiovascular:     Rate and Rhythm: Normal rate.     Pulses: Normal pulses.  Pulmonary:     Effort: Pulmonary effort is normal.     Breath sounds: No wheezing.  Abdominal:     General: Abdomen is flat. There is no distension.  Musculoskeletal:        General: No swelling. Normal range of motion.     Cervical back: Normal range of motion. No tenderness.  Skin:    General: Skin is warm and dry.  Neurological:     General: No focal deficit present.     Mental Status: She is alert and oriented to person, place, and time.  Psychiatric:        Attention and Perception: Attention and perception normal.        Mood and Affect: Mood is anxious.        Speech: Speech normal.        Behavior: Behavior is hyperactive.        Thought Content: Thought content is paranoid. Thought content does not include homicidal or suicidal ideation.        Cognition and Memory: Cognition and memory normal.        Judgment: Judgment is impulsive.     Review of Systems  Constitutional: Positive for activity change and appetite change.  HENT: Negative for hearing loss  and rhinorrhea.   Eyes: Negative for photophobia and visual disturbance.  Respiratory: Negative for cough and shortness of breath.    Cardiovascular: Negative for chest pain and palpitations.  Gastrointestinal: Negative for constipation, diarrhea and nausea.  Endocrine: Negative for cold intolerance and heat intolerance.  Genitourinary: Negative for difficulty urinating and hematuria.  Musculoskeletal: Negative for arthralgias and myalgias.  Skin: Negative for rash and wound.  Allergic/Immunologic: Negative for environmental allergies and food allergies.  Neurological: Negative for dizziness, seizures and headaches.  Hematological: Negative for adenopathy. Does not bruise/bleed easily.  Psychiatric/Behavioral: Positive for decreased concentration and sleep disturbance. Negative for behavioral problems, hallucinations and suicidal ideas. The patient is nervous/anxious.     Blood pressure (!) 127/91, pulse 91, temperature 98.7 F (37.1 C), temperature source Oral, resp. rate 18, height 5\' 2"  (1.575 m), weight 52.2 kg, SpO2 100 %, currently breastfeeding.Body mass index is 21.03 kg/m.  General Appearance: Well Groomed  Eye Contact:  Good  Speech:  Clear and Coherent  Volume:  Normal  Mood:  Somewhat elevated  Affect:  Full Range  Thought Process:  Coherent  Orientation:  Full (Time, Place, and Person)  Thought Content:  Paranoid Ideation  Suicidal Thoughts:  No  Homicidal Thoughts:  No  Memory:  Immediate;   Fair Recent;   Fair Remote;   Fair  Judgement:  Intact  Insight:  Present  Psychomotor Activity:  Normal  Concentration:  Concentration: Fair and Attention Span: Fair  Recall:  of Knowledge:  Good  Language:  Good  Akathisia:  Negative  Handed:  Right  AIMS (if indicated):     Assets:  Communication Skills Desire for Improvement Social Support  ADL's:  Intact  Cognition:  WNL  Sleep:  Number of Hours: 7     Treatment Plan Summary: Daily contact with patient to assess and evaluate symptoms and progress in treatment, Medication management and Plan decrease Prozac 20 mg daily with plan to  taper off. While continue Navane and Artane for one more night with plan to taper off if mania improves. Continue lithium, will need to check level on day 5. Subutex changed to suboxone per patient request. Klonopin now PRN per patient request.   [employer], MD 10/17/2019, 11:44 AM

## 2019-10-17 NOTE — Progress Notes (Signed)
   10/17/19 1500  Clinical Encounter Type  Visited With Patient  Visit Type Initial;Spiritual support;Social support;Behavioral Health  Referral From Chaplain  Consult/Referral To Chaplain  Visited Pt will rounding unit in [facility]. Pt talked to me about some problems she was having. Pt wanted to go outside. I told he to ask the nurse. Ch will follow-up with Pt.

## 2019-10-17 NOTE — Plan of Care (Signed)
D- Patient alert and oriented. Patient presented in an anxious, but fairly pleasant mood on assessment stating that she slept "better than I've been sleeping" last night. Patient had complaints of a headache that's radiating to her neck, rating her pain an "8/10", in which she did request PRN medication from this Clinical research associate. Patient endorsed anxiety, reporting that she didn't feel safe on the unit "not really, sometimes, not all the time", regarding another patient on the unit. Patient denied SI, HI, AVH at this time, stating "not that I'm going to act on". Patient has been extremely preoccupied with the unit groups and the programming, as well as, wanting to go outside multiple times today. Patient had to be redirected multiple times. Patient had no stated goals for today.  A- Scheduled medications administered to patient, per MD orders. Support and encouragement provided.  Routine safety checks conducted every 15 minutes.  Patient informed to notify staff with problems or concerns.  R- No adverse drug reactions noted. Patient contracts for safety at this time. Patient compliant with medications and treatment plan. Patient receptive, calm, and cooperative. Patient interacts well with others on the unit.  Patient remains safe at this time.  Problem: Education: Goal: Ability to state activities that reduce stress will improve Outcome: Not Progressing   Problem: Coping: Goal: Ability to identify and develop effective coping behavior will improve Outcome: Not Progressing   Problem: Self-Concept: Goal: Ability to identify factors that promote anxiety will improve Outcome: Not Progressing Goal: Level of anxiety will decrease Outcome: Not Progressing Goal: Ability to modify response to factors that promote anxiety will improve Outcome: Not Progressing   Problem: Education: Goal: Utilization of techniques to improve thought processes will improve Outcome: Not Progressing Goal: Knowledge of the  prescribed therapeutic regimen will improve Outcome: Not Progressing   Problem: Activity: Goal: Interest or engagement in leisure activities will improve Outcome: Not Progressing Goal: Imbalance in normal sleep/wake cycle will improve Outcome: Not Progressing   Problem: Coping: Goal: Coping ability will improve Outcome: Not Progressing Goal: Will verbalize feelings Outcome: Not Progressing   Problem: Health Behavior/Discharge Planning: Goal: Ability to make decisions will improve Outcome: Not Progressing Goal: Compliance with therapeutic regimen will improve Outcome: Not Progressing   Problem: Role Relationship: Goal: Will demonstrate positive changes in social behaviors and relationships Outcome: Not Progressing   Problem: Safety: Goal: Ability to disclose and discuss suicidal ideas will improve Outcome: Not Progressing Goal: Ability to identify and utilize support systems that promote safety will improve Outcome: Not Progressing   Problem: Self-Concept: Goal: Will verbalize positive feelings about self Outcome: Not Progressing Goal: Level of anxiety will decrease Outcome: Not Progressing   Problem: Education: Goal: Knowledge of [place] [facility] Education information/materials will improve Outcome: Not Progressing Goal: Emotional status will improve Outcome: Not Progressing Goal: Mental status will improve Outcome: Not Progressing Goal: Verbalization of understanding the information provided will improve Outcome: Not Progressing   Problem: Activity: Goal: Interest or engagement in activities will improve Outcome: Not Progressing Goal: Sleeping patterns will improve Outcome: Not Progressing   Problem: Coping: Goal: Ability to verbalize frustrations and anger appropriately will improve Outcome: Not Progressing Goal: Ability to demonstrate self-control will improve Outcome: Not Progressing   Problem: Health Behavior/Discharge Planning: Goal: Identification  of resources available to assist in meeting health care needs will improve Outcome: Not Progressing Goal: Compliance with treatment plan for underlying cause of condition will improve Outcome: Not Progressing   Problem: Physical Regulation: Goal: Ability to maintain clinical measurements within  normal limits will improve Outcome: Not Progressing   Problem: Safety: Goal: Periods of time without injury will increase Outcome: Not Progressing

## 2019-10-18 ENCOUNTER — Encounter: Payer: Medicaid Other | Admitting: Internal Medicine

## 2019-10-18 MED ORDER — THIOTHIXENE 2 MG PO CAPS
2.0000 mg | ORAL_CAPSULE | Freq: Two times a day (BID) | ORAL | Status: AC
  Administered 2019-10-18 – 2019-10-19 (×2): 2 mg via ORAL
  Filled 2019-10-18 (×3): qty 1

## 2019-10-18 MED ORDER — ENSURE ENLIVE PO LIQD
237.0000 mL | Freq: Three times a day (TID) | ORAL | Status: DC
  Administered 2019-10-18 – 2019-10-21 (×10): 237 mL via ORAL

## 2019-10-18 MED ORDER — CLONAZEPAM 0.25 MG PO TBDP
0.7500 mg | ORAL_TABLET | Freq: Two times a day (BID) | ORAL | Status: DC | PRN
  Administered 2019-10-19: 0.75 mg via ORAL
  Filled 2019-10-18: qty 3

## 2019-10-18 MED ORDER — TRIHEXYPHENIDYL HCL 2 MG PO TABS
1.0000 mg | ORAL_TABLET | Freq: Two times a day (BID) | ORAL | Status: AC
  Administered 2019-10-18 – 2019-10-19 (×2): 1 mg via ORAL
  Filled 2019-10-18 (×3): qty 1

## 2019-10-18 MED ORDER — BUPRENORPHINE HCL-NALOXONE HCL 2-0.5 MG SL SUBL
1.0000 | SUBLINGUAL_TABLET | Freq: Every day | SUBLINGUAL | Status: DC
  Administered 2019-10-19 – 2019-10-21 (×3): 1 via SUBLINGUAL
  Filled 2019-10-18 (×3): qty 1

## 2019-10-18 NOTE — Tx Team (Addendum)
Interdisciplinary Treatment and Diagnostic Plan Update  10/18/2019 Time of Session: 9:00AM Marcia West MRN: [id number]  Principal Diagnosis: Bipolar disorder, current episode manic without psychotic features (HCC)  Secondary Diagnoses: Principal Problem:   Bipolar disorder, current episode manic without psychotic features (HCC) Active Problems:   Opioid use disorder   Anxiety and depression   Insomnia secondary to anxiety   Current Medications:  Current Facility-Administered Medications  Medication Dose Route Frequency Provider Last Rate Last Admin  . acetaminophen (TYLENOL) tablet 650 mg  650 mg Oral Q6H PRN Dixon, Rashaun M, NP      . alum & mag hydroxide-simeth (MAALOX/MYLANTA) 200-200-20 MG/5ML suspension 30 mL  30 mL Oral Q4H PRN Dixon, Elray Buba, NP      . Melene Muller ON 10/19/2019] buprenorphine-naloxone (SUBOXONE) 2-0.5 mg per SL tablet 1 tablet  1 tablet Sublingual Q lunch Jesse Sans, MD      . clonazePAM Scarlette Calico) disintegrating tablet 0.75 mg  0.75 mg Oral BID PRN Jesse Sans, MD   0.75 mg at 10/18/19 4287  . feeding supplement (ENSURE ENLIVE) (ENSURE ENLIVE) liquid 237 mL  237 mL Oral TID BM Jesse Sans, MD      . FLUoxetine (PROZAC) capsule 20 mg  20 mg Oral Daily Jesse Sans, MD   20 mg at 10/18/19 0820  . influenza vac split quadrivalent PF (FLUARIX) injection 0.5 mL  0.5 mL Intramuscular Prior to discharge Roselind Messier, MD      . lithium carbonate capsule 150 mg  150 mg Oral BID WC Roselind Messier, MD   150 mg at 10/18/19 0819  . magnesium hydroxide (MILK OF MAGNESIA) suspension 30 mL  30 mL Oral Daily PRN Dixon, Rashaun M, NP      . nicotine (NICODERM CQ - dosed in mg/24 hours) patch 21 mg  21 mg Transdermal Daily Roselind Messier, MD   21 mg at 10/18/19 0824  . thiothixene (NAVANE) capsule 2 mg  2 mg Oral BID Jesse Sans, MD      . traZODone (DESYREL) tablet 100 mg  100 mg Oral QHS Roselind Messier, MD   100 mg at 10/17/19 2113  .  traZODone (DESYREL) tablet 50 mg  50 mg Oral QHS PRN Jearld Lesch, NP   50 mg at 10/17/19 2246  . trihexyphenidyl (ARTANE) tablet 1 mg  1 mg Oral BID WC Jesse Sans, MD       PTA Medications: Medications Prior to Admission  Medication Sig Dispense Refill Last Dose  . Buprenorphine HCl-Naloxone HCl 4-1 MG FILM Take 1/2 film daily SL. 7 each 0   . clonazePAM (KLONOPIN) 0.5 MG tablet Take 1 tablet (0.5 mg total) by mouth 2 (two) times daily for 30 doses. 30 tablet 0   . FLUoxetine (PROZAC) 20 MG capsule TAKE 3 CAPSULES (60 MG) BY MOUTH DAILY. 270 capsule 3   . traZODone (DESYREL) 50 MG tablet TAKE 1/2 TO 1 TABLETS (25-50 MG TOTAL) BY MOUTH AT BEDTIME. 30 tablet 2     Patient Stressors:    Patient Strengths:    Treatment Modalities: Medication Management, Group therapy, Case management,  1 to 1 session with clinician, Psychoeducation, Recreational therapy.   Physician Treatment Plan for Primary Diagnosis: Bipolar disorder, current episode manic without psychotic features (HCC) Long Term Goal(s):     Short Term Goals:    Medication Management: Evaluate patient's response, side effects, and tolerance of medication regimen.  Therapeutic Interventions: 1 to 1 sessions, Unit Group sessions and  Medication administration.  Evaluation of Outcomes: Progressing  Physician Treatment Plan for Secondary Diagnosis: Principal Problem:   Bipolar disorder, current episode manic without psychotic features (HCC) Active Problems:   Opioid use disorder   Anxiety and depression   Insomnia secondary to anxiety  Long Term Goal(s):     Short Term Goals:       Medication Management: Evaluate patient's response, side effects, and tolerance of medication regimen.  Therapeutic Interventions: 1 to 1 sessions, Unit Group sessions and Medication administration.  Evaluation of Outcomes: Progressing   RN Treatment Plan for Primary Diagnosis: Bipolar disorder, current episode manic without  psychotic features (HCC) Long Term Goal(s): Knowledge of disease and therapeutic regimen to maintain health will improve  Short Term Goals: Ability to demonstrate self-control, Ability to participate in decision making will improve, Ability to verbalize feelings will improve, Ability to disclose and discuss suicidal ideas, Ability to identify and develop effective coping behaviors will improve and Compliance with prescribed medications will improve  Medication Management: RN will administer medications as ordered by provider, will assess and evaluate patient's response and provide education to patient for prescribed medication. RN will report any adverse and/or side effects to prescribing provider.  Therapeutic Interventions: 1 on 1 counseling sessions, Psychoeducation, Medication administration, Evaluate responses to treatment, Monitor vital signs and CBGs as ordered, Perform/monitor CIWA, COWS, AIMS and Fall Risk screenings as ordered, Perform wound care treatments as ordered.  Evaluation of Outcomes: Progressing   LCSW Treatment Plan for Primary Diagnosis: Bipolar disorder, current episode manic without psychotic features (HCC) Long Term Goal(s): Safe transition to appropriate next level of care at discharge, Engage patient in therapeutic group addressing interpersonal concerns.  Short Term Goals: Engage patient in aftercare planning with referrals and resources, Increase social support, Increase ability to appropriately verbalize feelings, Increase emotional regulation, Facilitate acceptance of mental health diagnosis and concerns and Increase skills for wellness and recovery  Therapeutic Interventions: Assess for all discharge needs, 1 to 1 time with Social worker, Explore available resources and support systems, Assess for adequacy in community support network, Educate family and significant other(s) on suicide prevention, Complete Psychosocial Assessment, Interpersonal group  therapy.  Evaluation of Outcomes: Progressing   Progress in Treatment: Attending groups: Yes. Participating in groups: Yes. Taking medication as prescribed: Yes. Toleration medication: Yes. Family/Significant other contact made: Yes, individual(s) contacted:  SPE completed with the patient's mother Patient understands diagnosis: Yes. Discussing patient identified problems/goals with staff: Yes. Medical problems stabilized or resolved: Yes. Denies suicidal/homicidal ideation: No. Issues/concerns per patient self-inventory: No. Other: none  New problem(s) identified: No, Describe:  none  New Short Term/Long Term Goal(s):  detox, elimination of symptoms of psychosis, medication management for mood stabilization; elimination of SI thoughts; development of comprehensive mental wellness/sobriety plan.  Patient Goals:  "my anxiety and understanding my medicine"  Discharge Plan or Barriers: Patient reports plans to follow up with [facility]. CSW will assist the patient in scheduling appointment.   Reason for Continuation of Hospitalization: Anxiety Depression Medical Issues Medication stabilization Suicidal ideation Withdrawal symptoms  Estimated Length of Stay: 1-7 days  Recreational Therapy: Patient Stressors: N/A Patient Goal: Patient will engage in groups without prompting or encouragement from LRT x3 group sessions within 5 recreation therapy group sessions.  Attendees: Patient: Marcia West 10/18/2019 11:18 AM  Physician: Dr. Neale Burly, MD 10/18/2019 11:18 AM  Nursing: Jorene Minors, RN 10/18/2019 11:18 AM  RN Care Manager: 10/18/2019 11:18 AM  Social Worker: [name], LCSW 10/18/2019 11:18 AM  Recreational Therapist:  Hilbert Bible, LRT 10/18/2019 11:18 AM  Other: Dr. Toni Amend, MD 10/18/2019 11:18 AM  Other:  10/18/2019 11:18 AM  Other: 10/18/2019 11:18 AM    Scribe for Treatment Team: Harden Mo, LCSW 10/18/2019 11:18 AM

## 2019-10-18 NOTE — Progress Notes (Signed)
Recreation Therapy Notes  INPATIENT RECREATION THERAPY ASSESSMENT  Patient Details Name: Marcia West MRN: [id number] DOB: 02-24-1985 Today's Date: 10/18/2019       Information Obtained From: Patient  Able to Participate in Assessment/Interview: Yes  Patient Presentation: Responsive  Reason for Admission (Per Patient): Active Symptoms  Patient Stressors:    Coping Skills:   Journal, Write, Exercise, Read  Leisure Interests (2+):  Social - Family, Exercise - Walking, [employer], Individual - Reading, Individual - Journaling, Crafts - Knitting/Crocheting  Frequency of Recreation/Participation: Monthly  Awareness of Community Resources:     [employer]:     Current Use:    If no, Barriers?:    Expressed Interest in [employer] Information:    [place]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

## 2019-10-18 NOTE — Progress Notes (Signed)
D: Pt alert and oriented. Pt rates depression 9/10, and anxiety 10/10. Pt denies experiencing any pain at this time. Pt denies experiencing any SI/HI, or AVH at this time. Pt hesitant to answer SI/HI/AVH question. Pt at medication administration this morning asked three times what medications she was receiving this morning and this writer told her three times what she was receiving this morning. Pt also asked specifically what her medication were for and this writer explained what each medication was for. Once the medications were in front of the pt she asked if specific medications were in the cup and this writer replied yes those are medications you are receiving this morning. Pt this quickly took the medication without placing the suboxin under her tongue and then became upset about it stating that this writer did not tell her that medication was in there. This Clinical research associate had to explain this Clinical research associate told her multiple time what medication she was receiving. The pt then became upset, tearful, and stating that this writer lied to her. Later this morning the pt requested her Klonopin for anxiety. This Clinical research associate made sure the pt understood is was disintegrating tablets. The pt still stated so I should swallow them. This Clinical research associate stated no, you must either place it under your tongue or in your cheek. This had to be reiterated twice, because the pt still attempted to swallow the pills.   A: Scheduled medications administered to pt, per MD orders. Support and encouragement provided. Frequent verbal contact made. Routine safety checks conducted q15 minutes.   R: No adverse drug reactions noted. Pt verbally contracts for safety at this time. Pt complaint with medications and treatment plan. Pt interacts well with others on the unit. Pt remains safe at this time. Will continue to monitor.

## 2019-10-18 NOTE — Progress Notes (Signed)
Recreation Therapy Notes  Date: 10/18/2019  Time: 9:30 am   Location: Craft room   Behavioral response: Appropriate  Intervention Topic: Goals   Discussion/Intervention:  Group content on today was focused on goals. Patients described what goals are and how they define goals. Individuals expressed how they go about setting goals and reaching them. The group identified how important goals are and if they make short term goals to reach long term goals. Patients described how many goals they work on at a time and what affects them not reaching their goal. Individuals described how much time they put into planning and obtaining their goals. The group participated in the intervention "My Goal Board" and made personal goal boards to help them achieve their goal.  Clinical Observations/Feedback: Patient came to group and defined goals as things that you are trying to achieve. She expressed that goals are important  To get things done. Individual was social with peers and staff while participating in the intervention.  Sausha Raymond LRT/CTRS         Alcide Memoli 10/18/2019 11:52 AM

## 2019-10-18 NOTE — Plan of Care (Signed)
Patient compliant with medication administration per MD orders  Problem: Health Behavior/Discharge Planning: Goal: Compliance with therapeutic regimen will improve Outcome: Progressing   

## 2019-10-18 NOTE — Progress Notes (Signed)
Patient has been cooperative on shift, she has constant request from staff. Patient reports anxiety and depression. Patient has been medication compliant on shift. No issues to report on shift at this time.

## 2019-10-18 NOTE — Progress Notes (Signed)
Recreation Therapy Notes   Date: 10/18/2019  Time: 11:00am  Location: Courtyard   Behavioral response: Appropriate  Group Type: Leisure  Participation level: Active  Communication: Patient was social with peers and staff.  Comments: N/A  [name] LRT/CTRS        Marcia West 10/18/2019 11:54 AM 

## 2019-10-18 NOTE — Progress Notes (Signed)
[facility] MD Progress Note  10/18/2019 12:13 PM Marcedes Tech  MRN:  [id number] Subjective:  Patient seen in inpatient office today. Computer screen shared to walk through medication list one by one to explain medication name, dose, indication, and potential side effects. She remains somewhat guarded and paranoid, and is convinced staff is trying to give her medications incorrectly. In particular, she is upset that she swallowed subxone instead of using sublingually. Reassured her that all staff is supporting her recovery. Have moved Suboxone to be with lunch so that no other medications are given at the same time. Reviewed labs with patient as well, and discussed plan to checked TSH and Lithium level on Thursday. She requests that I call her mother Misty Stanley today.   Collateral from [place], patient's mother, at [phone]: Mother states that [name] has been deteriorating since August where she kept repeating "1,2,3, that's all I need" and acting psychotic. Mother notes that at this time she was trying to wean off suboxone and became sneaking a lot of alcohol. She has a previous history of psychotic episode 4-5 years ago where she didn't recognize her children and wanted to kill them. She has had a second psychotic episode due to stopping her medications cold [place] as well. Diagnosis and medication list reviewed with mother per patient's request. Mother notes that she does sound much better than on admission, but continues to remain paranoid on the phone. Mother is hopeful that [name] can be discharged Thursday of this week as she is available to give her a ride.   Principal Problem: Bipolar disorder, current episode manic without psychotic features (HCC) Diagnosis: Principal Problem:   Bipolar disorder, current episode manic without psychotic features (HCC) Active Problems:   Opioid use disorder   Anxiety and depression   Insomnia secondary to anxiety  Total Time spent with patient: 45 minutes  Past  Psychiatric History: Two previous hospital admission. History of court ordered medications during pregnancy. 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  She states she has been on numerous medications in the past, but can only recall a few. Specifically, she has been on Wellbutrin which induced hyperactivity and mania, Prozac which was not helpful, Seroquel which made her feel tired, Buspar ineffective at max dose, Abilify ineffective for mood stabilization and depression in the past.   Past Medical History:  Past Medical History:  Diagnosis Date  . Anxiety   . Bipolar affective (HCC)   . Depression   . Herpes simplex type II infection   . Substance abuse [facility])     Past Surgical History:  Procedure Laterality Date  . NO PAST SURGERIES    . WISDOM TOOTH EXTRACTION     Family History:  Family History  Problem Relation Age of Onset  . Arthritis Maternal Grandmother   . Diabetes Maternal Grandmother   . Diabetes Maternal Grandfather   . Obesity Paternal Grandmother    Family Psychiatric  History: mother and father with depression Social History:  Social History   Substance and Sexual Activity  Alcohol Use Not Currently     Social History   Substance and Sexual Activity  Drug Use Not Currently  . Types: Methamphetamines, Other-see comments, Marijuana, "Crack" cocaine   Comment: suboxone daily    Social History   Socioeconomic History  . Marital status: Single    Spouse name: Not on file  . Number of children: Not on file  . Years of education: Not on file  . Highest education level: Not on  file  Occupational History  . Not on file  Tobacco Use  . Smoking status: Current Every Day Smoker    Packs/day: 0.25    Types: Cigarettes  . Smokeless tobacco: Never Used  . Tobacco comment: 4 cigs per day  Vaping Use  . Vaping Use: Never used  Substance and Sexual Activity  . Alcohol use: Not Currently  . Drug use: Not Currently    Types: Methamphetamines,  Other-see comments, Marijuana, "Crack" cocaine    Comment: suboxone daily  . Sexual activity: Not Currently    Birth control/protection: None  Other Topics Concern  . Not on file  Social History Narrative  . Not on file   Social Determinants of Health   Financial Resource Strain:   . Difficulty of Paying Living Expenses: Not on file  Food Insecurity:   . Worried About Programme researcher, broadcasting/film/videounning Out of Food in the Last Year: Not on file  . Ran Out of Food in the Last Year: Not on file  Transportation Needs:   . Lack of Transportation (Medical): Not on file  . Lack of Transportation (Non-Medical): Not on file  Physical Activity:   . Days of Exercise per Week: Not on file  . Minutes of Exercise per Session: Not on file  Stress:   . Feeling of Stress : Not on file  Social Connections:   . Frequency of Communication with Friends and Family: Not on file  . Frequency of Social Gatherings with Friends and Family: Not on file  . Attends Religious Services: Not on file  . Active Member of Clubs or Organizations: Not on file  . Attends BankerClub or Organization Meetings: Not on file  . Marital Status: Not on file   Additional Social History:     89 and 34 ----year old in [place]  She has 33 year old With sister  Sleep: Fair  Appetite:  Fair  Current Medications: Current Facility-Administered Medications  Medication Dose Route Frequency Provider Last Rate Last Admin  . acetaminophen (TYLENOL) tablet 650 mg  650 mg Oral Q6H PRN Dixon, Rashaun M, NP      . alum & mag hydroxide-simeth (MAALOX/MYLANTA) 200-200-20 MG/5ML suspension 30 mL  30 mL Oral Q4H PRN Jearld Leschixon, Rashaun M, NP      . Melene Muller[START ON 10/19/2019] buprenorphine-naloxone (SUBOXONE) 2-0.5 mg per SL tablet 1 tablet  1 tablet Sublingual Q lunch Jesse SansFreeman, Chandy Tarman M, MD      . clonazePAM Scarlette Calico(KLONOPIN) disintegrating tablet 0.75 mg  0.75 mg Oral BID PRN Jesse SansFreeman, Kekoa Fyock M, MD      . feeding supplement (ENSURE ENLIVE) (ENSURE ENLIVE) liquid 237 mL  237 mL  Oral TID BM Jesse SansFreeman, Shalaine Payson M, MD      . influenza vac split quadrivalent PF (FLUARIX) injection 0.5 mL  0.5 mL Intramuscular Prior to discharge Roselind Messierao, Ramakrishna, MD      . lithium carbonate capsule 150 mg  150 mg Oral BID WC Roselind Messierao, Ramakrishna, MD   150 mg at 10/18/19 0819  . magnesium hydroxide (MILK OF MAGNESIA) suspension 30 mL  30 mL Oral Daily PRN Dixon, Rashaun M, NP      . nicotine (NICODERM CQ - dosed in mg/24 hours) patch 21 mg  21 mg Transdermal Daily Roselind Messierao, Ramakrishna, MD   21 mg at 10/18/19 0824  . thiothixene (NAVANE) capsule 2 mg  2 mg Oral BID Jesse SansFreeman, Tajuana Kniskern M, MD      . traZODone (DESYREL) tablet 100 mg  100 mg Oral QHS Roselind Messierao, Ramakrishna, MD  100 mg at 10/17/19 2113  . traZODone (DESYREL) tablet 50 mg  50 mg Oral QHS PRN Jearld Lesch, NP   50 mg at 10/17/19 2246  . trihexyphenidyl (ARTANE) tablet 1 mg  1 mg Oral BID WC Jesse Sans, MD        Lab Results: No results found for this or any previous visit (from the past 48 hour(s)).  Blood Alcohol level:  Lab Results  Component Value Date   ETH <10 10/14/2019    Metabolic Disorder Labs: No results found for: HGBA1C, MPG No results found for: PROLACTIN No results found for: CHOL, TRIG, HDL, CHOLHDL, VLDL, LDLCALC  Physical Findings: AIMS: Facial and Oral Movements Muscles of Facial Expression: None, normal Lips and Perioral Area: None, normal Jaw: None, normal Tongue: None, normal,Extremity Movements Upper (arms, wrists, hands, fingers): None, normal Lower (legs, knees, ankles, toes): None, normal, Trunk Movements Neck, shoulders, hips: None, normal, Overall Severity Severity of abnormal movements (highest score from questions above): None, normal Incapacitation due to abnormal movements: None, normal Patient's awareness of abnormal movements (rate only patient's report): No Awareness, Dental Status Current problems with teeth and/or dentures?: No Does patient usually wear dentures?: No  CIWA:    COWS:      Musculoskeletal: Strength & Muscle Tone: within normal limits Gait & Station: normal Patient leans: N/A  Psychiatric Specialty Exam: Physical Exam Vitals and nursing note reviewed.  Constitutional:      Appearance: Normal appearance.  HENT:     Head: Normocephalic and atraumatic.     Right Ear: External ear normal.     Left Ear: External ear normal.     Nose: Nose normal.     Mouth/Throat:     Mouth: Mucous membranes are moist.     Pharynx: Oropharynx is clear.  Eyes:     Extraocular Movements: Extraocular movements intact.     Pupils: Pupils are equal, round, and reactive to light.  Cardiovascular:     Rate and Rhythm: Normal rate.     Pulses: Normal pulses.  Pulmonary:     Effort: Pulmonary effort is normal.     Breath sounds: Normal breath sounds.  Abdominal:     General: Abdomen is flat. There is no distension.  Musculoskeletal:        General: No swelling. Normal range of motion.     Cervical back: Normal range of motion. Rigidity present.  Skin:    General: Skin is warm and dry.  Neurological:     General: No focal deficit present.     Mental Status: She is alert and oriented to person, place, and time.  Psychiatric:        Attention and Perception: She does not perceive auditory or visual hallucinations.        Mood and Affect: Mood is anxious.        Speech: Speech normal.        Behavior: Behavior is agitated. Behavior is cooperative.        Thought Content: Thought content is paranoid. Thought content does not include homicidal or suicidal ideation.        Cognition and Memory: Cognition is impaired. Memory is impaired.        Judgment: Judgment is impulsive.     Review of Systems  Constitutional: Positive for appetite change. Negative for fever.  HENT: Negative for congestion and rhinorrhea.   Eyes: Negative for photophobia and visual disturbance.  Respiratory: Negative for chest tightness and shortness of breath.  Cardiovascular: Negative for chest  pain and palpitations.  Gastrointestinal: Negative for constipation, diarrhea and nausea.  Endocrine: Negative for cold intolerance and heat intolerance.  Genitourinary: Negative for difficulty urinating and dyspareunia.  Musculoskeletal: Negative for arthralgias and myalgias.  Skin: Negative for rash and wound.  Allergic/Immunologic: Negative for environmental allergies and food allergies.  Neurological: Negative for dizziness and seizures.  Hematological: Negative for adenopathy. Does not bruise/bleed easily.  Psychiatric/Behavioral: Positive for agitation, confusion and sleep disturbance. Negative for suicidal ideas. The patient is nervous/anxious.     Blood pressure 115/79, pulse 75, temperature 97.9 F (36.6 C), temperature source Oral, resp. rate 18, height 5\' 2"  (1.575 m), weight 52.2 kg, SpO2 100 %, currently breastfeeding.Body mass index is 21.03 kg/m.  General Appearance: Guarded  Eye Contact:  Good  Speech:  Normal Rate  Volume:  Normal  Mood:  Anxious and paranoid  Affect:  Congruent  Thought Process:  Coherent  Orientation:  Full (Time, Place, and Person)  Thought Content:  Paranoid Ideation and Rumination  Suicidal Thoughts:  No  Homicidal Thoughts:  No  Memory:  Immediate;   Fair Recent;   Fair Remote;   Fair  Judgement:  Impaired  Insight:  Shallow  Psychomotor Activity:  Restlessness  Concentration:  Concentration: Fair and Attention Span: Poor  Recall:  Negative  Fund of Knowledge:  Fair  Language:  Fair  Akathisia:  Negative  Handed:  Right  AIMS (if indicated):     Assets:  Communication Skills Desire for Improvement Financial Resources/Insurance Housing Physical Health  ADL's:  Intact  Cognition:  Impaired,  Mild  Sleep:  Number of Hours: 6.5     Treatment Plan Summary: Daily contact with patient to assess and evaluate symptoms and progress in treatment, Medication management and Plan discontinue Prozac. Plan to taper and discontinue Navane and  Artane after tomorrow's doses. Continue Lithium 300 mg BID. Will need lithium level and TSH on Thursday morning before receiving Lithium.   Saturday, MD 10/18/2019, 12:13 PM

## 2019-10-19 MED ORDER — CLONAZEPAM 1 MG PO TABS: 1.0000 mg | ORAL_TABLET | Freq: Two times a day (BID) | ORAL | Status: DC | PRN

## 2019-10-19 MED ORDER — CLONAZEPAM 1 MG PO TABS
1.0000 mg | ORAL_TABLET | Freq: Two times a day (BID) | ORAL | Status: DC
  Administered 2019-10-19 – 2019-10-21 (×6): 1 mg via ORAL
  Filled 2019-10-19 (×6): qty 1

## 2019-10-19 NOTE — Progress Notes (Signed)
Patient presents with anxiety. Patient observed interacting appropriately with staff and peers on the unit. Patient endorses anxiety, denies SI/HI/AVH. Pt compliant with medication administration per MD orders. Patient given education, support, and encouragement to be active in her treatment plan. Patient being monitored Q 15 minutes for safety per unit protocol. Pt remains safe on the on the unit.

## 2019-10-19 NOTE — Plan of Care (Signed)
Has been in the milieu, attended groups but continues to complain of anxiety. Taking antianxiety medications. Denying suicidal/homicidal thoughts. Currently in bed resting per MD recommendation after pt complained of dizziness. VS WNL.

## 2019-10-19 NOTE — Progress Notes (Signed)
Recreation Therapy Notes   Date: 10/19/2019  Time: 9:30 am   Location: Craft room   Behavioral response: Appropriate  Intervention Topic: Self-esteem   Discussion/Intervention:  Group content today was focused on self-esteem. Patient defined self-esteem and where it comes form. The group described reasons self-esteem is important. Individuals stated things that impact self-esteem and positive ways to improve self-esteem. Patient went over the components of self-esteem. The group participated in the intervention where patients were able to use origami to identify positive ways to improve their self-esteem.    Clinical Observations/Feedback: Patient came to group and defined self-esteem as ego and how people feel about themselves. She explained that self-esteem comes from inside of you. Participant expressed that self-esteem can be impacted by environment and how people treat you. Individual was social with peers and staff while participating in the intervention.  Raheen Capili LRT/CTRS          Tyren Dugar 10/19/2019 11:54 AM

## 2019-10-19 NOTE — Progress Notes (Signed)
Patient presents with anxiety. Patient observed interacting appropriately with staff and peers on the unit. Patient endorses anxiety, denies SI/HI/AVH. Pt compliant with medication administration per MD orders. Patient given education, support, and encouragement to be active in her treatment plan. Patient being monitored Q 15 minutes for safety per unit protocol. Pt remains safe on the on the unit.  

## 2019-10-19 NOTE — Plan of Care (Signed)
Patient endorses anxiety but states she is feeling better   Problem: Self-Concept: Goal: Level of anxiety will decrease Outcome: Progressing

## 2019-10-19 NOTE — Progress Notes (Signed)
[facility] MD Progress Note  10/19/2019 10:06 AM Marcia West  MRN:  [id number]   Subjective:  [name] was seen one-on-one in office on unit today. She states that she has been feeling increasingly depressed and anxious while on the unit. Her anxiety has increased largely in part by not being able to go outside which is one of her main coping strategies at home. She is also upset with the patient booklets referencing [place] mental [facility] and having many inconsistencies. However, she has become much more understanding and receptive to feedback on these topics. While she previously threw her book and stormed off two days ago, she was able to tolerate explanations today. Several helpful handouts on goal setting and coping skills were pointed out, and she did feel these could be helpful. Today we once again reviewed her medication list on the computer so she could verify name and dose she was receiving, and I reviewed indications once more. Plan to check Lithium level and TSH in the morning. We did discuss potentially adding Abilify to her regimen for bipolar depression should this not improve with lithium alone. She currently feels that she is not safe for discharge home, and is worried that suicidal thoughts will return. She consents for voluntary treatment in the hospital today.   Principal Problem: Bipolar disorder, current episode manic without psychotic features (HCC) Diagnosis: Principal Problem:   Bipolar disorder, current episode manic without psychotic features (HCC) Active Problems:   Opioid use disorder   Anxiety and depression   Insomnia secondary to anxiety  Total Time spent with patient: 30 minutes  Past Psychiatric History:Two previous hospital admission. History of court ordered medications during pregnancy. 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 She states she has been on numerous medications in the past, but can only recall a few. Specifically, she has been  on Wellbutrin which induced hyperactivity and mania, Prozac which was not helpful, Seroquel which made her feel tired, Buspar ineffective at max dose, Abilify ineffective for mood stabilization and depression in the past.  Past Medical History:  Past Medical History:  Diagnosis Date  . Anxiety   . Bipolar affective (HCC)   . Depression   . Herpes simplex type II infection   . Substance abuse [facility])     Past Surgical History:  Procedure Laterality Date  . NO PAST SURGERIES    . WISDOM TOOTH EXTRACTION     Family History:  Family History  Problem Relation Age of Onset  . Arthritis Maternal Grandmother   . Diabetes Maternal Grandmother   . Diabetes Maternal Grandfather   . Obesity Paternal Grandmother    Family Psychiatric  History: mother and father with depression Social History:  Social History   Substance and Sexual Activity  Alcohol Use Not Currently     Social History   Substance and Sexual Activity  Drug Use Not Currently  . Types: Methamphetamines, Other-see comments, Marijuana, "Crack" cocaine   Comment: suboxone daily    Social History   Socioeconomic History  . Marital status: Single    Spouse name: Not on file  . Number of children: Not on file  . Years of education: Not on file  . Highest education level: Not on file  Occupational History  . Not on file  Tobacco Use  . Smoking status: Current Every Day Smoker    Packs/day: 0.25    Types: Cigarettes  . Smokeless tobacco: Never Used  . Tobacco comment: 4 cigs per day  Vaping Use  .  Vaping Use: Never used  Substance and Sexual Activity  . Alcohol use: Not Currently  . Drug use: Not Currently    Types: Methamphetamines, Other-see comments, Marijuana, "Crack" cocaine    Comment: suboxone daily  . Sexual activity: Not Currently    Birth control/protection: None  Other Topics Concern  . Not on file  Social History Narrative  . Not on file   Social Determinants of Health   Financial Resource  Strain:   . Difficulty of Paying Living Expenses: Not on file  Food Insecurity:   . Worried About Programme researcher, broadcasting/film/video in the Last Year: Not on file  . Ran Out of Food in the Last Year: Not on file  Transportation Needs:   . Lack of Transportation (Medical): Not on file  . Lack of Transportation (Non-Medical): Not on file  Physical Activity:   . Days of Exercise per Week: Not on file  . Minutes of Exercise per Session: Not on file  Stress:   . Feeling of Stress : Not on file  Social Connections:   . Frequency of Communication with Friends and Family: Not on file  . Frequency of Social Gatherings with Friends and Family: Not on file  . Attends Religious Services: Not on file  . Active Member of Clubs or Organizations: Not on file  . Attends Banker Meetings: Not on file  . Marital Status: Not on file   Additional Social History:  48 and 81 ----year old in [place]  She has 22 year old With sister  Sleep: Poor  Appetite:  Fair  Current Medications: Current Facility-Administered Medications  Medication Dose Route Frequency Provider Last Rate Last Admin  . acetaminophen (TYLENOL) tablet 650 mg  650 mg Oral Q6H PRN Dixon, Rashaun M, NP      . alum & mag hydroxide-simeth (MAALOX/MYLANTA) 200-200-20 MG/5ML suspension 30 mL  30 mL Oral Q4H PRN Dixon, Rashaun M, NP      . buprenorphine-naloxone (SUBOXONE) 2-0.5 mg per SL tablet 1 tablet  1 tablet Sublingual Q lunch Jesse Sans, MD      . clonazePAM Scarlette Calico) tablet 1 mg  1 mg Oral BID Jesse Sans, MD   1 mg at 10/19/19 0948  . feeding supplement (ENSURE ENLIVE) (ENSURE ENLIVE) liquid 237 mL  237 mL Oral TID BM Jesse Sans, MD   237 mL at 10/19/19 0942  . influenza vac split quadrivalent PF (FLUARIX) injection 0.5 mL  0.5 mL Intramuscular Prior to discharge Roselind Messier, MD      . lithium carbonate capsule 150 mg  150 mg Oral BID WC Roselind Messier, MD   150 mg at 10/19/19 0859  . magnesium  hydroxide (MILK OF MAGNESIA) suspension 30 mL  30 mL Oral Daily PRN Dixon, Rashaun M, NP      . nicotine (NICODERM CQ - dosed in mg/24 hours) patch 21 mg  21 mg Transdermal Daily Roselind Messier, MD   21 mg at 10/19/19 0743  . traZODone (DESYREL) tablet 100 mg  100 mg Oral QHS Roselind Messier, MD   100 mg at 10/18/19 2103  . traZODone (DESYREL) tablet 50 mg  50 mg Oral QHS PRN Jearld Lesch, NP   50 mg at 10/17/19 2246    Lab Results: No results found for this or any previous visit (from the past 48 hour(s)).  Blood Alcohol level:  Lab Results  Component Value Date   ETH <10 10/14/2019    Metabolic Disorder  Labs: No results found for: HGBA1C, MPG No results found for: PROLACTIN No results found for: CHOL, TRIG, HDL, CHOLHDL, VLDL, LDLCALC  Physical Findings: AIMS: Facial and Oral Movements Muscles of Facial Expression: None, normal Lips and Perioral Area: None, normal Jaw: None, normal Tongue: None, normal,Extremity Movements Upper (arms, wrists, hands, fingers): None, normal Lower (legs, knees, ankles, toes): None, normal, Trunk Movements Neck, shoulders, hips: None, normal, Overall Severity Severity of abnormal movements (highest score from questions above): None, normal Incapacitation due to abnormal movements: None, normal Patient's awareness of abnormal movements (rate only patient's report): No Awareness, Dental Status Current problems with teeth and/or dentures?: No Does patient usually wear dentures?: No  CIWA:    COWS:     Musculoskeletal: Strength & Muscle Tone: within normal limits Gait & Station: normal Patient leans: N/A  Psychiatric Specialty Exam: Physical Exam  Review of Systems  Blood pressure 121/82, pulse 64, temperature 97.8 F (36.6 C), temperature source Oral, resp. rate 18, height 5\' 2"  (1.575 m), weight 52.2 kg, SpO2 100 %, currently breastfeeding.Body mass index is 21.03 kg/m.  General Appearance: Fairly Groomed  Eye Contact:  Good   Speech:  Normal Rate  Volume:  Normal  Mood:  Anxious  Affect:  Congruent  Thought Process:  Coherent  Orientation:  Full (Time, Place, and Person)  Thought Content:  Paranoid Ideation  Suicidal Thoughts:  No  Homicidal Thoughts:  No  Memory:  Immediate;   Good Recent;   Good Remote;   Good  Judgement:  Intact  Insight:  Fair  Psychomotor Activity:  Normal  Concentration:  Concentration: Fair and Attention Span: Fair  Recall:  of Knowledge:  Good  Language:  Fair  Akathisia:  Negative  Handed:  Right  AIMS (if indicated):     Assets:  Communication Skills Desire for Improvement Physical Health Social Support  ADL's:  Intact  Cognition:  WNL  Sleep:  Number of Hours: 5.5     Treatment Plan Summary: Daily contact with patient to assess and evaluate symptoms and progress in treatment, Medication management and Plan Prozac, Navane, and Artane taper complete. Continue Lithium 300 mg BID. Will check lithium level and TSH in the monring.   [employer], MD 10/19/2019, 10:06 AM

## 2019-10-20 LAB — LITHIUM LEVEL: Lithium Lvl: 0.27 mmol/L — ABNORMAL LOW (ref 0.60–1.20)

## 2019-10-20 LAB — TSH: TSH: 2.134 u[IU]/mL (ref 0.350–4.500)

## 2019-10-20 MED ORDER — LITHIUM CARBONATE 150 MG PO CAPS
150.0000 mg | ORAL_CAPSULE | Freq: Once | ORAL | Status: AC
  Administered 2019-10-20: 150 mg via ORAL
  Filled 2019-10-20: qty 1

## 2019-10-20 MED ORDER — ARIPIPRAZOLE 5 MG PO TABS
5.0000 mg | ORAL_TABLET | Freq: Every day | ORAL | Status: DC
  Administered 2019-10-20 – 2019-10-21 (×2): 5 mg via ORAL
  Filled 2019-10-20 (×2): qty 1

## 2019-10-20 MED ORDER — LITHIUM CARBONATE ER 450 MG PO TBCR
450.0000 mg | EXTENDED_RELEASE_TABLET | Freq: Two times a day (BID) | ORAL | Status: DC
  Administered 2019-10-20 – 2019-10-21 (×2): 450 mg via ORAL
  Filled 2019-10-20 (×2): qty 1

## 2019-10-20 NOTE — Progress Notes (Signed)
[facility] Note  10/20/2019 11:03 AM Marcia West  MRN:  [id number]   Subjective:  [name] presents to office beside nurses station tearful today. She expresses that she feels more depressed than ever, and is having a hard time concentrating. She states she was trying to pay attention in anger management group, but felt she was not understanding what was happening. We reviewed her lab results today. Lithium level low at 0.27 mmol/L, and TSH was within normal limits. Discussed plan to increase Lithium to 450 mg BID to better address mood. She feels the scheduled Klonopin has been helpful. She continues to feel unsafe to discharge with current level of depression and feels she would be unable to care for herself or her daughter in current mental state.   Principal Problem: Bipolar disorder, current episode manic without psychotic features (HCC) Diagnosis: Principal Problem:   Bipolar disorder, current episode manic without psychotic features (HCC) Active Problems:   Opioid use disorder   Anxiety and depression   Insomnia secondary to anxiety  Total Time spent with patient: 30 minutes  Past Psychiatric History: Two previous hospital admission. History of court ordered medications during pregnancy. 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 She states she has been on numerous medications in the past, but can only recall a few. Specifically, she has been on Wellbutrin which induced hyperactivity and mania, Prozac which was not helpful, Seroquel which made her feel tired, Buspar ineffective at max dose, Abilify ineffective for mood stabilization and depression in the past.  Past Medical History:  Past Medical History:  Diagnosis Date  . Anxiety   . Bipolar affective (HCC)   . Depression   . Herpes simplex type II infection   . Substance abuse [facility](HCC)     Past Surgical History:  Procedure Laterality Date  . NO PAST SURGERIES    . WISDOM TOOTH EXTRACTION     Family  History:  Family History  Problem Relation Age of Onset  . Arthritis Maternal Grandmother   . Diabetes Maternal Grandmother   . Diabetes Maternal Grandfather   . Obesity Paternal Grandmother    Family Psychiatric  History: Mother and father with depression Social History:  Social History   Substance and Sexual Activity  Alcohol Use Not Currently     Social History   Substance and Sexual Activity  Drug Use Not Currently  . Types: Methamphetamines, Other-see comments, Marijuana, "Crack" cocaine   Comment: suboxone daily    Social History   Socioeconomic History  . Marital status: Single    Spouse name: Not on file  . Number of children: Not on file  . Years of education: Not on file  . Highest education level: Not on file  Occupational History  . Not on file  Tobacco Use  . Smoking status: Current Every Day Smoker    Packs/day: 0.25    Types: Cigarettes  . Smokeless tobacco: Never Used  . Tobacco comment: 4 cigs per day  Vaping Use  . Vaping Use: Never used  Substance and Sexual Activity  . Alcohol use: Not Currently  . Drug use: Not Currently    Types: Methamphetamines, Other-see comments, Marijuana, "Crack" cocaine    Comment: suboxone daily  . Sexual activity: Not Currently    Birth control/protection: None  Other Topics Concern  . Not on file  Social History Narrative  . Not on file   Social Determinants of Health   Financial Resource Strain:   . Difficulty of Paying  Living Expenses: Not on file  Food Insecurity:   . Worried About Programme researcher, broadcasting/film/video in the Last Year: Not on file  . Ran Out of Food in the Last Year: Not on file  Transportation Needs:   . Lack of Transportation (Medical): Not on file  . Lack of Transportation (Non-Medical): Not on file  Physical Activity:   . Days of Exercise per Week: Not on file  . Minutes of Exercise per Session: Not on file  Stress:   . Feeling of Stress : Not on file  Social Connections:   . Frequency of  Communication with Friends and Family: Not on file  . Frequency of Social Gatherings with Friends and Family: Not on file  . Attends Religious Services: Not on file  . Active Member of Clubs or Organizations: Not on file  . Attends Banker Meetings: Not on file  . Marital Status: Not on file   Additional Social History:  33 and 20 ----year old in [place]  She has 33 year old With sister  Sleep: Fair  Appetite:  Fair  Current Medications: Current Facility-Administered Medications  Medication Dose Route Frequency Provider Last Rate Last Admin  . acetaminophen (TYLENOL) tablet 650 mg  650 mg Oral Q6H PRN Dixon, Rashaun M, NP      . alum & mag hydroxide-simeth (MAALOX/MYLANTA) 200-200-20 MG/5ML suspension 30 mL  30 mL Oral Q4H PRN Dixon, Rashaun M, NP      . buprenorphine-naloxone (SUBOXONE) 2-0.5 mg per SL tablet 1 tablet  1 tablet Sublingual Q lunch Jesse Sans, MD   1 tablet at 10/19/19 1216  . clonazePAM (KLONOPIN) tablet 1 mg  1 mg Oral BID Jesse Sans, MD   1 mg at 10/20/19 0753  . feeding supplement (ENSURE ENLIVE) (ENSURE ENLIVE) liquid 237 mL  237 mL Oral TID BM Jesse Sans, MD   237 mL at 10/20/19 1019  . influenza vac split quadrivalent PF (FLUARIX) injection 0.5 mL  0.5 mL Intramuscular Prior to discharge Roselind Messier, MD      . lithium carbonate (ESKALITH) CR tablet 450 mg  450 mg Oral Q12H Jesse Sans, MD      . lithium carbonate capsule 150 mg  150 mg Oral Once Jesse Sans, MD      . magnesium hydroxide (MILK OF MAGNESIA) suspension 30 mL  30 mL Oral Daily PRN Dixon, Rashaun M, NP      . nicotine (NICODERM CQ - dosed in mg/24 hours) patch 21 mg  21 mg Transdermal Daily Roselind Messier, MD   21 mg at 10/20/19 0755  . traZODone (DESYREL) tablet 100 mg  100 mg Oral QHS Roselind Messier, MD   100 mg at 10/19/19 2125  . traZODone (DESYREL) tablet 50 mg  50 mg Oral QHS PRN Jearld Lesch, NP   50 mg at 10/19/19 2125    Lab  Results:  Results for orders placed or performed during the hospital encounter of 10/15/19 (from the past 48 hour(s))  Lithium level     Status: Abnormal   Collection Time: 10/20/19  6:34 AM  Result Value Ref Range   Lithium Lvl 0.27 (L) 0.60 - 1.20 mmol/L    Comment: Performed at [facility], [address] [place], [place] [address]  TSH     Status: None   Collection Time: 10/20/19  6:34 AM  Result Value Ref Range   TSH 2.134 0.350 - 4.500 uIU/mL  Comment: Performed by a 3rd Generation assay with a functional sensitivity of <=0.01 uIU/mL. Performed at [facility], [address] [place], [place] [address]     Blood Alcohol level:  Lab Results  Component Value Date   [facility] <10 10/14/2019    Metabolic Disorder Labs: No results found for: HGBA1C, MPG No results found for: PROLACTIN No results found for: CHOL, TRIG, HDL, CHOLHDL, VLDL, LDLCALC  Physical Findings: AIMS: Facial and Oral Movements Muscles of Facial Expression: None, normal Lips and Perioral Area: None, normal Jaw: None, normal Tongue: None, normal,Extremity Movements Upper (arms, wrists, hands, fingers): None, normal Lower (legs, knees, ankles, toes): None, normal, Trunk Movements Neck, shoulders, hips: None, normal, Overall Severity Severity of abnormal movements (highest score from questions above): None, normal Incapacitation due to abnormal movements: None, normal Patient's awareness of abnormal movements (rate only patient's report): No Awareness, Dental Status Current problems with teeth and/or dentures?: No Does patient usually wear dentures?: No  CIWA:    COWS:     Musculoskeletal: Strength & Muscle Tone: within normal limits Gait & Station: normal Patient leans: N/A  Psychiatric Specialty Exam: Physical Exam Constitutional:      General: She is in acute distress.  HENT:     Head: Normocephalic and atraumatic.     Right Ear: External ear normal.     Left Ear: External  ear normal.     Nose: Nose normal.     Mouth/Throat:     Mouth: Mucous membranes are moist.     Pharynx: Oropharynx is clear.  Eyes:     Extraocular Movements: Extraocular movements intact.     Pupils: Pupils are equal, round, and reactive to light.  Cardiovascular:     Rate and Rhythm: Normal rate.     Pulses: Normal pulses.  Pulmonary:     Effort: Pulmonary effort is normal.     Breath sounds: Normal breath sounds.  Abdominal:     General: Abdomen is flat. There is no distension.  Musculoskeletal:        General: No swelling. Normal range of motion.     Cervical back: Normal range of motion and neck supple.  Skin:    General: Skin is warm and dry.  Neurological:     General: No focal deficit present.     Mental Status: She is alert and oriented to person, place, and time.  Psychiatric:        Attention and Perception: She does not perceive auditory or visual hallucinations.        Mood and Affect: Mood is anxious and depressed. Affect is tearful.        Speech: Speech normal.        Behavior: Behavior is cooperative.        Thought Content: Thought content does not include homicidal or suicidal ideation.        Cognition and Memory: Cognition and memory normal.        Judgment: Judgment is impulsive.     Review of Systems  Constitutional: Negative for appetite change and fatigue.  HENT: Negative for rhinorrhea and sore throat.   Eyes: Negative for photophobia and visual disturbance.  Respiratory: Negative for cough and shortness of breath.   Cardiovascular: Negative for chest pain and palpitations.  Gastrointestinal: Negative for constipation, diarrhea, nausea and vomiting.  Endocrine: Negative for cold intolerance and heat intolerance.  Genitourinary: Negative for difficulty urinating and dyspareunia.  Musculoskeletal: Negative for arthralgias and myalgias.  Skin: Negative for rash  and wound.  Allergic/Immunologic: Negative for environmental allergies and food  allergies.  Neurological: Negative for dizziness and seizures.  Psychiatric/Behavioral: Positive for agitation, dysphoric mood and sleep disturbance. Negative for suicidal ideas. The patient is nervous/anxious.     Blood pressure 110/87, pulse 73, temperature 98.2 F (36.8 C), temperature source Oral, resp. rate 17, height 5\' 2"  (1.575 m), weight 52.2 kg, SpO2 99 %, currently breastfeeding.Body mass index is 21.03 kg/m.  General Appearance: Dressed in sweatshirt and sweat pants. Very tearful on exam today.   Eye Contact:  Good  Speech:  Normal Rate  Volume:  Normal  Mood:  Anxious  Affect:  Congruent and Tearful  Thought Process:  Coherent  Orientation:  Full (Time, Place, and Person)  Thought Content:  Paranoid Ideation  Suicidal Thoughts:  No  Homicidal Thoughts:  No  Memory:  Immediate;   Fair Recent;   Fair Remote;   Fair  Judgement:  Fair  Insight:  Fair  Psychomotor Activity:  Negative  Concentration:  Concentration: Poor and Attention Span: Poor  Recall:  of Knowledge:  Fair  Language:  Fair  Akathisia:  Negative  Handed:  Right  AIMS (if indicated):     Assets:  Communication Skills Desire for Improvement Financial Resources/Insurance Housing Social Support Talents/Skills  ADL's:  Intact  Cognition:  Impaired,  Mild  Sleep:  Number of Hours: 7.75     Treatment Plan Summary: Daily contact with patient to assess and evaluate symptoms and progress in treatment, Medication management and Plan increase Lithium to 450 mg BID  [employer], MD 10/20/2019, 11:03 AM

## 2019-10-20 NOTE — Plan of Care (Signed)
Patient endorses anxiety but did state to this writer that she was feeling better  Problem: Self-Concept: Goal: Level of anxiety will decrease Outcome: Progressing

## 2019-10-20 NOTE — Progress Notes (Signed)
D: Pt alert and oriented. Pt rates depression 10/10 and anxiety 7/10. Pt denies experiencing any pain at this time. Pt denies experiencing any SI/HI, or AVH at this time.   Pt's anxiety and neediness has improved today. Pt is not at the nurse's station as much. Pt apologized to behavior on Tuesday.   A: Scheduled medications administered to pt, per MD orders. Support and encouragement provided. Frequent verbal contact made. Routine safety checks conducted q15 minutes.   R: No adverse drug reactions noted. Pt verbally contracts for safety at this time. Pt complaint with medications and treatment plan. Pt interacts well with others on the unit. Pt remains safe at this time. Will continue to monitor.

## 2019-10-20 NOTE — Progress Notes (Signed)
Recreation Therapy Notes   Date: 10/20/2019  Time: 9:30 am   Location: Craft room   Behavioral response: Appropriate  Intervention Topic: Anger-Management   Discussion/Intervention:  Group content on today was focused on anger management. The group defined anger and reasons they become angry. Individuals expressed negative way they have dealt with anger in the past. Patients stated some positive ways they could deal with anger in the future. The group described how anger can affect your health and daily plans. Individuals participated in the intervention "Score your anger" where they had a chance to answer questions about themselves and get a score of their anger.   Clinical Observations/Feedback: Patient came to group and defined anger management as how you manage what upsets you. She expressed that she does not manage her anger well at all. Participant stated that she can improve her anger management by reading self-help books, coloring and getting fresh air. Individual was social with peers and staff while participating in the intervention.  Marcia West LRT/CTRS            Marcia West 10/20/2019 1:21 PM

## 2019-10-20 NOTE — Progress Notes (Signed)
Patient presents with anxiety. Patient observed interacting appropriately with staff and peers on the unit. Patient endorses anxiety, denies SI/HI/AVH. Pt compliant with medication administration per MD orders. Patient given education, support, and encouragement to be active in her treatment plan. Patient being monitored Q 15 minutes for safety per unit protocol. Pt remains safe on the on the unit.  

## 2019-10-20 NOTE — Progress Notes (Signed)
BRIEF PHARMACY NOTE  This patient attended and participated in Medication Management Group counseling led by [facility] staff pharmacist.  This interactive class reviews basic information about prescription medications and education on personal responsibility in medication management.  The class also includes general knowledge of 3 main classes of behavioral medications, including antipsychotics, antidepressants, and mood stabilizers.     Patient behavior was appropriate for group setting.   Educational materials sourced from:  "Medication Do's and Don'ts" from [email or web address]   "Mental Health Medications" from [facility] of Mental Health [email or web address]#[email or web address]    Marcia West 10/20/2019 , 3:54 PM

## 2019-10-20 NOTE — Progress Notes (Signed)
Marcia West stopped by this afternoon to speak with me again. She expressed that she continues to feel more depressed than she has in her entire life. She feels she is having a lot of negative thoughts, trouble concentrating, and poor memory. Discussed starting Abilify 5 mg daily to assist with mood and paranoia. Continue Lithium 450 BID. She is in agreement with this plan.

## 2019-10-21 MED ORDER — CLONAZEPAM 1 MG PO TABS
1.0000 mg | ORAL_TABLET | Freq: Two times a day (BID) | ORAL | 1 refills | Status: DC
Start: 2019-10-21 — End: 2019-10-23

## 2019-10-21 MED ORDER — NICOTINE 21 MG/24HR TD PT24
21.0000 mg | MEDICATED_PATCH | Freq: Every day | TRANSDERMAL | 0 refills | Status: DC
Start: 2019-10-22 — End: 2019-10-23

## 2019-10-21 MED ORDER — ARIPIPRAZOLE 5 MG PO TABS
5.0000 mg | ORAL_TABLET | Freq: Every day | ORAL | 1 refills | Status: DC
Start: 2019-10-22 — End: 2019-10-23

## 2019-10-21 MED ORDER — TRAZODONE HCL 150 MG PO TABS
150.0000 mg | ORAL_TABLET | Freq: Every evening | ORAL | 1 refills | Status: DC | PRN
Start: 2019-10-21 — End: 2019-10-23

## 2019-10-21 MED ORDER — LITHIUM CARBONATE ER 450 MG PO TBCR
450.0000 mg | EXTENDED_RELEASE_TABLET | Freq: Two times a day (BID) | ORAL | 1 refills | Status: DC
Start: 2019-10-21 — End: 2019-10-23

## 2019-10-21 NOTE — Progress Notes (Signed)
Recreation Therapy Notes  Date: 10/21/2019  Time: 9:30 am   Location: Craft room   Behavioral response: Appropriate  Intervention Topic: Communication   Discussion/Intervention:  Group content today was focused on communication. The group defined communication and ways to communicate with others. Individuals stated reason why communication is important and some reasons to communicate with others. Patients expressed if they thought they were good at communicating with others and ways they could improve their communication skills. The group identified important parts of communication and some experiences they have had in the past with communication. The group participated in the intervention "What is that?", where they had a chance to test out their communication skills and identify ways to improve their communication techniques.  Clinical Observations/Feedback: Patient came to group and defined communication as using words. She explained that sometimes her emotions stop her from communicating properly.  Individual was social with peers and staff while participating in the intervention.  Lizzie Cokley LRT/CTRS         Cyrena Kuchenbecker 10/21/2019 3:00 PM

## 2019-10-21 NOTE — Plan of Care (Signed)
Pt rates depression 9/10, hopelessness 2/10 and anxiety 8/10. Pt denies SI, HI and AVH. Pt was educated on care plan and verbalizes understanding. Pt encouraged to attend groups. Torrie Mayers RN Problem: Education: Goal: Ability to state activities that reduce stress will improve Outcome: Adequate for Discharge   Problem: Coping: Goal: Ability to identify and develop effective coping behavior will improve Outcome: Adequate for Discharge   Problem: Self-Concept: Goal: Ability to identify factors that promote anxiety will improve Outcome: Adequate for Discharge Goal: Level of anxiety will decrease Outcome: Adequate for Discharge Goal: Ability to modify response to factors that promote anxiety will improve Outcome: Adequate for Discharge   Problem: Education: Goal: Utilization of techniques to improve thought processes will improve Outcome: Adequate for Discharge Goal: Knowledge of the prescribed therapeutic regimen will improve Outcome: Adequate for Discharge   Problem: Activity: Goal: Interest or engagement in leisure activities will improve Outcome: Adequate for Discharge Goal: Imbalance in normal sleep/wake cycle will improve Outcome: Adequate for Discharge   Problem: Coping: Goal: Coping ability will improve Outcome: Adequate for Discharge Goal: Will verbalize feelings Outcome: Adequate for Discharge   Problem: Health Behavior/Discharge Planning: Goal: Ability to make decisions will improve Outcome: Adequate for Discharge Goal: Compliance with therapeutic regimen will improve Outcome: Adequate for Discharge   Problem: Role Relationship: Goal: Will demonstrate positive changes in social behaviors and relationships Outcome: Adequate for Discharge   Problem: Safety: Goal: Ability to disclose and discuss suicidal ideas will improve Outcome: Adequate for Discharge Goal: Ability to identify and utilize support systems that promote safety will improve Outcome: Adequate for  Discharge   Problem: Self-Concept: Goal: Will verbalize positive feelings about self Outcome: Adequate for Discharge Goal: Level of anxiety will decrease Outcome: Adequate for Discharge   Problem: Education: Goal: Knowledge of [facility] Education information/materials will improve Outcome: Adequate for Discharge Goal: Emotional status will improve Outcome: Adequate for Discharge Goal: Mental status will improve Outcome: Adequate for Discharge Goal: Verbalization of understanding the information provided will improve Outcome: Adequate for Discharge   Problem: Activity: Goal: Interest or engagement in activities will improve Outcome: Adequate for Discharge Goal: Sleeping patterns will improve Outcome: Adequate for Discharge   Problem: Coping: Goal: Ability to verbalize frustrations and anger appropriately will improve Outcome: Adequate for Discharge Goal: Ability to demonstrate self-control will improve Outcome: Adequate for Discharge   Problem: Health Behavior/Discharge Planning: Goal: Identification of resources available to assist in meeting health care needs will improve Outcome: Adequate for Discharge Goal: Compliance with treatment plan for underlying cause of condition will improve Outcome: Adequate for Discharge   Problem: Physical Regulation: Goal: Ability to maintain clinical measurements within normal limits will improve Outcome: Adequate for Discharge   Problem: Safety: Goal: Periods of time without injury will increase Outcome: Adequate for Discharge

## 2019-10-21 NOTE — Progress Notes (Signed)
Pt denies SI. Pt was given belongings, transition packet and prescriptions. Pt was educated on dc plan and verbalizes understanding. Torrie Mayers RN

## 2019-10-21 NOTE — [facility] Suicide Risk Assessment (Signed)
[facility] Discharge Suicide Risk Assessment   Principal Problem: Bipolar disorder, current episode manic without psychotic features [facility]) Discharge Diagnoses: Principal Problem:   Bipolar disorder, current episode manic without psychotic features (HCC) Active Problems:   Opioid use disorder   Anxiety and depression   Insomnia secondary to anxiety   Total Time spent with patient: 45 minutes  Musculoskeletal: Strength & Muscle Tone: within normal limits Gait & Station: normal Patient leans: N/A  Psychiatric Specialty Exam: Review of Systems  Constitutional: Negative for activity change and fatigue.  HENT: Negative for rhinorrhea and sore throat.   Eyes: Negative for photophobia and visual disturbance.  Respiratory: Negative for chest tightness and shortness of breath.   Cardiovascular: Negative for chest pain and palpitations.  Gastrointestinal: Negative for constipation, diarrhea and nausea.  Endocrine: Negative for cold intolerance and heat intolerance.  Genitourinary: Negative for difficulty urinating and dyspareunia.  Musculoskeletal: Negative for back pain and myalgias.  Skin: Negative for rash and wound.  Allergic/Immunologic: Negative for food allergies and immunocompromised state.  Neurological: Negative for dizziness and seizures.  Hematological: Negative for adenopathy. Does not bruise/bleed easily.  Psychiatric/Behavioral: Negative for hallucinations and suicidal ideas.    Blood pressure 123/76, pulse 87, temperature 97.9 F (36.6 C), resp. rate 17, height 5\' 2"  (1.575 m), weight 52.2 kg, SpO2 99 %, currently breastfeeding.Body mass index is 21.03 kg/m.  General Appearance: Fairly Groomed  ::  Good  Speech:  Normal Rate409  Volume:  Normal  Mood:  Euthymic  Affect:  Congruent  Thought Process:  Coherent  Orientation:  Full (Time, Place, and Person)  Thought Content:  Logical  Suicidal Thoughts:  No  Homicidal Thoughts:  No  Memory:  Immediate;    Good Recent;   Good Remote;   Good  Judgement:  Good  Insight:  Good  Psychomotor Activity:  Normal  Concentration:  Good  Recall:  Good  Fund of Knowledge:Good  Language: Good  Akathisia:  Negative  Handed:  Right  AIMS (if indicated):     Assets:  Communication Skills Desire for Improvement Financial Resources/Insurance Housing Physical Health Resilience Social Support Talents/Skills  Sleep:  Number of Hours: 7.5  Cognition: WNL  ADL's:  Intact   Mental Status Per Nursing Assessment::   On Admission:  NA  Demographic Factors:  Caucasian  Loss Factors: NA  Historical Factors: Family history of mental illness or substance abuse  Risk Reduction Factors:   Responsible for children under 29 years of age, Sense of responsibility to family, Religious beliefs about death, Positive social support, Positive therapeutic relationship and Positive coping skills or problem solving skills  Continued Clinical Symptoms:  More than one psychiatric diagnosis  Cognitive Features That Contribute To Risk:  None    Suicide Risk:  Minimal: No identifiable suicidal ideation.  Patients presenting with no risk factors but with morbid ruminations; may be classified as minimal risk based on the severity of the depressive symptoms   Follow-up Information    [facility] Follow up.   Contact information: [address] [place] [address]               Plan Of Care/Follow-up recommendations:  Activity:  as tolerated Diet:  regular diet  [phone], MD 10/21/2019, 2:49 PM

## 2019-10-21 NOTE — Discharge Summary (Signed)
Physician Discharge Summary Note  Patient:  Marcia NayRachael Lafevers is an 34 y.o., female MRN:  [id number] DOB:  June 16, 1985 Patient phone:  [phone] (home)  Patient address:   [address] [place] [id number],  Total Time spent with patient: 45 minutes  Date of Admission:  10/15/2019 Date of Discharge: 10/21/2019  Reason for Admission:  Acute mania and psychosis  Principal Problem: Bipolar disorder, current episode manic without psychotic features [facility](HCC) Discharge Diagnoses: Principal Problem:   Bipolar disorder, current episode manic without psychotic features (HCC) Active Problems:   Opioid use disorder   Anxiety and depression   Insomnia secondary to anxiety   Past Psychiatric History: wo previous hospital admission. History of court ordered medications during pregnancy. 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 She states she has been on numerous medications in the past, but can only recall a few. Specifically, she has been on Wellbutrin which induced hyperactivity and mania, Prozac which was not helpful, Seroquel which made her feel tired, Buspar ineffective at max dose, Abilify ineffective monotherapy for mood stabilization and depression in the past.  Past Medical History:  Past Medical History:  Diagnosis Date  . Anxiety   . Bipolar affective (HCC)   . Depression   . Herpes simplex type II infection   . Substance abuse [facility](HCC)     Past Surgical History:  Procedure Laterality Date  . NO PAST SURGERIES    . WISDOM TOOTH EXTRACTION     Family History:  Family History  Problem Relation Age of Onset  . Arthritis Maternal Grandmother   . Diabetes Maternal Grandmother   . Diabetes Maternal Grandfather   . Obesity Paternal Grandmother    Family Psychiatric  History: Mother and father with depression Social History:  Social History   Substance and Sexual Activity  Alcohol Use Not Currently     Social History   Substance and Sexual Activity   Drug Use Not Currently  . Types: Methamphetamines, Other-see comments, Marijuana, "Crack" cocaine   Comment: suboxone daily    Social History   Socioeconomic History  . Marital status: Single    Spouse name: Not on file  . Number of children: Not on file  . Years of education: Not on file  . Highest education level: Not on file  Occupational History  . Not on file  Tobacco Use  . Smoking status: Current Every Day Smoker    Packs/day: 0.25    Types: Cigarettes  . Smokeless tobacco: Never Used  . Tobacco comment: 4 cigs per day  Vaping Use  . Vaping Use: Never used  Substance and Sexual Activity  . Alcohol use: Not Currently  . Drug use: Not Currently    Types: Methamphetamines, Other-see comments, Marijuana, "Crack" cocaine    Comment: suboxone daily  . Sexual activity: Not Currently    Birth control/protection: None  Other Topics Concern  . Not on file  Social History Narrative  . Not on file   Social Determinants of Health   Financial Resource Strain:   . Difficulty of Paying Living Expenses: Not on file  Food Insecurity:   . Worried About Programme researcher, broadcasting/film/videounning Out of Food in the Last Year: Not on file  . Ran Out of Food in the Last Year: Not on file  Transportation Needs:   . Lack of Transportation (Medical): Not on file  . Lack of Transportation (Non-Medical): Not on file  Physical Activity:   . Days of Exercise per Week: Not on file  .  Minutes of Exercise per Session: Not on file  Stress:   . Feeling of Stress : Not on file  Social Connections:   . Frequency of Communication with Friends and Family: Not on file  . Frequency of Social Gatherings with Friends and Family: Not on file  . Attends Religious Services: Not on file  . Active Member of Clubs or Organizations: Not on file  . Attends Banker Meetings: Not on file  . Marital Status: Not on file    Hospital Course:  Patient was brought to the emergency room by her sister in an acutely agitated manic  and psychotic state. She has recently stopped her medications, and began to get confused and labile. While she was here in the hospital she was placed on Lithium 300 mg BID. Her level was 0.27 mmol/L at this dose, and it was subsequently titrated to Lithium Cr 450 mg BID. Her prozac was discontinued to assist with mood stabilization. She was also given Navane and Artane initially for psychosis, but this was discontinued and switched to Abilify 5 mg daily to reduce side effects and better address remaining depression. She was also on Klonopin 1 mg BID for anxiety, trazodone 150 mg QHS for sleep, and her suboxone was continued during hospital stay. [name] greatly improved during her hospital stay, and gradually became less paranoid. She was fully invested in her care, and attended every group offered by the staff. She benefits greatly from reviewing her medications, the dose, and the indications. She has an extremely supportive family that will help her when she leaves the hospital, and she plans to slowly return to working. At time of discharge patient's family, patient, and treatment team all felt she was safe to discharge home with outpatient follow-up.   Physical Findings: AIMS: Facial and Oral Movements Muscles of Facial Expression: None, normal Lips and Perioral Area: None, normal Jaw: None, normal Tongue: None, normal,Extremity Movements Upper (arms, wrists, hands, fingers): None, normal Lower (legs, knees, ankles, toes): None, normal, Trunk Movements Neck, shoulders, hips: None, normal, Overall Severity Severity of abnormal movements (highest score from questions above): None, normal Incapacitation due to abnormal movements: None, normal Patient's awareness of abnormal movements (rate only patient's report): No Awareness, Dental Status Current problems with teeth and/or dentures?: No Does patient usually wear dentures?: No  CIWA:   0 COWS:   0  Musculoskeletal: Strength & Muscle Tone:  within normal limits Gait & Station: normal Patient leans: N/A  Psychiatric Specialty Exam: Physical Exam Constitutional:      Appearance: Normal appearance.  HENT:     Head: Normocephalic and atraumatic.     Right Ear: External ear normal.     Left Ear: External ear normal.     Nose: Nose normal.     Mouth/Throat:     Mouth: Mucous membranes are moist.     Pharynx: Oropharynx is clear.  Eyes:     Extraocular Movements: Extraocular movements intact.     Pupils: Pupils are equal, round, and reactive to light.  Cardiovascular:     Rate and Rhythm: Normal rate.     Pulses: Normal pulses.  Pulmonary:     Effort: Pulmonary effort is normal.     Breath sounds: Normal breath sounds.  Abdominal:     General: Abdomen is flat. There is no distension.  Musculoskeletal:        General: No swelling. Normal range of motion.     Cervical back: Normal range of motion. No rigidity.  Skin:    General: Skin is warm and dry.  Neurological:     General: No focal deficit present.     Mental Status: She is alert and oriented to person, place, and time.  Psychiatric:        Thought Content: Thought content normal.        Judgment: Judgment normal.   Review of Systems  Constitutional: Negative for activity change and fatigue.  HENT: Negative for rhinorrhea and sore throat.   Eyes: Negative for photophobia and visual disturbance.  Respiratory: Negative for chest tightness and shortness of breath.   Cardiovascular: Negative for chest pain and palpitations.  Gastrointestinal: Negative for constipation, diarrhea and nausea.  Endocrine: Negative for cold intolerance and heat intolerance.  Genitourinary: Negative for difficulty urinating and dyspareunia.  Musculoskeletal: Negative for back pain and myalgias.  Skin: Negative for rash and wound.  Allergic/Immunologic: Negative for food allergies and immunocompromised state.  Neurological: Negative for dizziness and seizures.  Hematological:  Negative for adenopathy. Does not bruise/bleed easily.  Psychiatric/Behavioral: Negative for hallucinations and suicidal ideas.   Blood pressure 123/76, pulse 87, temperature 97.9 F (36.6 C), resp. rate 17, height 5\' 2"  (1.575 m), weight 52.2 kg, SpO2 99 %, currently breastfeeding.Body mass index is 21.03 kg/m.  General Appearance: Fairly Groomed  ::  Good  Speech:  Normal Rate409  Volume:  Normal  Mood:  Euthymic  Affect:  Congruent  Thought Process:  Coherent  Orientation:  Full (Time, Place, and Person)  Thought Content:  Logical  Suicidal Thoughts:  No  Homicidal Thoughts:  No  Memory:  Immediate;   Good Recent;   Good Remote;   Good  Judgement:  Good  Insight:  Good  Psychomotor Activity:  Normal  Concentration:  Good  Recall:  Good  Fund of Knowledge:Good  Language: Good  Akathisia:  Negative  Handed:  Right  AIMS (if indicated):     Assets:  Communication Skills Desire for Improvement Financial Resources/Insurance Housing Physical Health Resilience Social Support Talents/Skills  Sleep:  Number of Hours: 7.5  Cognition: WNL  ADL's:  Intact           Has this patient used any form of tobacco in the last 30 days? (Cigarettes, Smokeless Tobacco, Cigars, and/or Pipes) Yes, A prescription for an FDA-approved tobacco cessation medication was offered at discharge.  Blood Alcohol level:  Lab Results  Component Value Date   ETH <10 10/14/2019    Metabolic Disorder Labs:  No results found for: HGBA1C, MPG No results found for: PROLACTIN No results found for: CHOL, TRIG, HDL, CHOLHDL, VLDL, LDLCALC  See Psychiatric Specialty Exam and Suicide Risk Assessment completed by Attending Physician prior to discharge.  Discharge destination:  Home  Is patient on multiple antipsychotic therapies at discharge:  No   Has Patient had three or more failed trials of antipsychotic monotherapy by history:  No  Recommended Plan for Multiple Antipsychotic  Therapies: NA  Discharge Instructions    Increase activity slowly   Complete by: As directed      Allergies as of 10/21/2019   No Known Allergies     Medication List    STOP taking these medications   FLUoxetine 20 MG capsule Commonly known as: PROZAC     TAKE these medications     Indication  ARIPiprazole 5 MG tablet Commonly known as: ABILIFY Take 1 tablet (5 mg total) by mouth daily. Start taking on: October 22, 2019  Indication: Manic Phase of Manic-Depression  Buprenorphine HCl-Naloxone HCl 4-1 MG Film Take 1/2 film daily SL.  Indication: Opioid Dependence   clonazePAM 1 MG tablet Commonly known as: KLONOPIN Take 1 tablet (1 mg total) by mouth 2 (two) times daily. What changed:   medication strength  how much to take  Indication: Feeling Anxious   lithium carbonate 450 MG CR tablet Commonly known as: ESKALITH Take 1 tablet (450 mg total) by mouth every 12 (twelve) hours.  Indication: Manic-Depression   nicotine 21 mg/24hr patch Commonly known as: NICODERM CQ - dosed in mg/24 hours Place 1 patch (21 mg total) onto the skin daily. Start taking on: October 22, 2019  Indication: Nicotine Addiction   traZODone 150 MG tablet Commonly known as: DESYREL Take 1 tablet (150 mg total) by mouth at bedtime as needed for sleep. What changed:   medication strength  See the new instructions.  Indication: Trouble Sleeping       Follow-up [facility] Follow up.   Contact information: [address] [place] [address] [phone]               Follow-up recommendations:  Activity:  as tolearted Diet:  regular diet Tests:  will need a follow-up lithium level. Dose was increased on 10/20/2019.   Signed: Jesse Sans, MD 10/21/2019, 2:52 PM

## 2019-10-21 NOTE — Progress Notes (Signed)
  [facility] Adult Case Management Discharge Plan :  Will you be returning to the same living situation after discharge:  Yes,  pt reports that she is returning home.  At discharge, do you have transportation home?: Yes,  pt reports that her mother is providing transportation. Do you have the ability to pay for your medications: Yes,  [place] Medicaid.  Release of information consent forms completed and in the chart;  Patient's signature needed at discharge.  Patient to Follow up at:  [facility] on 10/24/2019.   Why: Appointment is scheduled for 8:30AM. Please use their [facility].  Please park in the lower parking lot and use side entrance.  Please bring ID, Social Security card, insurance card and proof of income. Thanks! Contact information: [address] [place] [address] [phone]               Next level of care provider has access to [facility] Link:no  Safety Planning and Suicide Prevention discussed: Yes,  SPE completed with pt and pt's sister.      Has patient been referred to the Quitline?: Patient refused referral  Patient has been referred for addiction treatment: Pt. refused referral  Harden Mo, LCSW 10/21/2019, 3:34 PM

## 2019-10-21 NOTE — Progress Notes (Signed)
Recreation Therapy Notes   Date: 10/21/2019  Time: 1:00pm   Location: Courtyard   Behavioral response: Appropriate  Group Type: Leisure  Participation level: Active  Communication: Patient was social with peers and staff.  Comments: N/A  Shatora Weatherbee LRT/CTRS        Harrel Ferrone 10/21/2019 3:51 PM 

## 2019-10-21 NOTE — Progress Notes (Addendum)
[facility]  10/21/2019 1:08 PM Marcia West  MRN:  [id number]   Subjective:  Marcia West seen one-on-one this morning. She states she is feeling somewhat better than yesterday, but remains very anxious. This morning she felt she was not prepared to leave due to high anxiety. However, later in the afternoon she felt she was prepared for discharge. She denies any suicidal ideations, homicidal ideations, visual hallucinations, or auditory hallucinations. She has been participating in groups, and interacting well with staff and peers on the unit. Medications and indications reviewed with her again today.   Principal Problem: Bipolar disorder, current episode manic without psychotic features (HCC) Diagnosis: Principal Problem:   Bipolar disorder, current episode manic without psychotic features (HCC) Active Problems:   Opioid use disorder   Anxiety and depression   Insomnia secondary to anxiety  Total Time spent with patient: 30 minutes  Past Psychiatric History: Two previous hospital admission. History of court ordered medications during pregnancy. 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 She states she has been on numerous medications in the past, but can only recall a few. Specifically, she has been on Wellbutrin which induced hyperactivity and mania, Prozac which was not helpful, Seroquel which made her feel tired, Buspar ineffective at max dose, Abilify ineffective for mood stabilization and depression in the past.   Past Medical History:  Past Medical History:  Diagnosis Date  . Anxiety   . Bipolar affective (HCC)   . Depression   . Herpes simplex type II infection   . Substance abuse [facility](HCC)     Past Surgical History:  Procedure Laterality Date  . NO PAST SURGERIES    . WISDOM TOOTH EXTRACTION     Family History:  Family History  Problem Relation Age of Onset  . Arthritis Maternal Grandmother   . Diabetes Maternal Grandmother   . Diabetes Maternal  Grandfather   . Obesity Paternal Grandmother    Family Psychiatric  History:  Mother and father with depression Social History:  Social History   Substance and Sexual Activity  Alcohol Use Not Currently     Social History   Substance and Sexual Activity  Drug Use Not Currently  . Types: Methamphetamines, Other-see comments, Marijuana, "Crack" cocaine   Comment: suboxone daily    Social History   Socioeconomic History  . Marital status: Single    Spouse name: Not on file  . Number of children: Not on file  . Years of education: Not on file  . Highest education level: Not on file  Occupational History  . Not on file  Tobacco Use  . Smoking status: Current Every Day Smoker    Packs/day: 0.25    Types: Cigarettes  . Smokeless tobacco: Never Used  . Tobacco comment: 4 cigs per day  Vaping Use  . Vaping Use: Never used  Substance and Sexual Activity  . Alcohol use: Not Currently  . Drug use: Not Currently    Types: Methamphetamines, Other-see comments, Marijuana, "Crack" cocaine    Comment: suboxone daily  . Sexual activity: Not Currently    Birth control/protection: None  Other Topics Concern  . Not on file  Social History Narrative  . Not on file   Social Determinants of Health   Financial Resource Strain:   . Difficulty of Paying Living Expenses: Not on file  Food Insecurity:   . Worried About Programme researcher, broadcasting/film/videounning Out of Food in the Last Year: Not on file  . Ran Out of Food in the  Last Year: Not on file  Transportation Needs:   . Lack of Transportation (Medical): Not on file  . Lack of Transportation (Non-Medical): Not on file  Physical Activity:   . Days of Exercise per Week: Not on file  . Minutes of Exercise per Session: Not on file  Stress:   . Feeling of Stress : Not on file  Social Connections:   . Frequency of Communication with Friends and Family: Not on file  . Frequency of Social Gatherings with Friends and Family: Not on file  . Attends Religious Services:  Not on file  . Active Member of Clubs or Organizations: Not on file  . Attends Banker Meetings: Not on file  . Marital Status: Not on file   Additional Social History:  71 and 38 ----year old in [place]  She has 32 year old With sister  Sleep: Fair  Appetite:  Fair  Current Medications: Current Facility-Administered Medications  Medication Dose Route Frequency Provider Last Rate Last Admin  . acetaminophen (TYLENOL) tablet 650 mg  650 mg Oral Q6H PRN Dixon, Rashaun M, NP      . alum & mag hydroxide-simeth (MAALOX/MYLANTA) 200-200-20 MG/5ML suspension 30 mL  30 mL Oral Q4H PRN Dixon, Rashaun M, NP      . ARIPiprazole (ABILIFY) tablet 5 mg  5 mg Oral Daily Jesse Sans, MD   5 mg at 10/21/19 0813  . buprenorphine-naloxone (SUBOXONE) 2-0.5 mg per SL tablet 1 tablet  1 tablet Sublingual Q lunch Jesse Sans, MD   1 tablet at 10/21/19 1221  . clonazePAM (KLONOPIN) tablet 1 mg  1 mg Oral BID Jesse Sans, MD   1 mg at 10/21/19 0813  . feeding supplement (ENSURE ENLIVE) (ENSURE ENLIVE) liquid 237 mL  237 mL Oral TID BM Jesse Sans, MD   237 mL at 10/21/19 1221  . influenza vac split quadrivalent PF (FLUARIX) injection 0.5 mL  0.5 mL Intramuscular Prior to discharge Roselind Messier, MD      . lithium carbonate (ESKALITH) CR tablet 450 mg  450 mg Oral Q12H Jesse Sans, MD   450 mg at 10/21/19 0813  . magnesium hydroxide (MILK OF MAGNESIA) suspension 30 mL  30 mL Oral Daily PRN Dixon, Rashaun M, NP      . nicotine (NICODERM CQ - dosed in mg/24 hours) patch 21 mg  21 mg Transdermal Daily Roselind Messier, MD   21 mg at 10/21/19 0815  . traZODone (DESYREL) tablet 100 mg  100 mg Oral QHS Roselind Messier, MD   100 mg at 10/20/19 2105  . traZODone (DESYREL) tablet 50 mg  50 mg Oral QHS PRN Jearld Lesch, NP   50 mg at 10/20/19 2106    Lab Results:  Results for orders placed or performed during the hospital encounter of 10/15/19 (from the past 48  hour(s))  Lithium level     Status: Abnormal   Collection Time: 10/20/19  6:34 AM  Result Value Ref Range   Lithium Lvl 0.27 (L) 0.60 - 1.20 mmol/L    Comment: Performed at [facility], [address] [place], [place] [address]  TSH     Status: None   Collection Time: 10/20/19  6:34 AM  Result Value Ref Range   TSH 2.134 0.350 - 4.500 uIU/mL    Comment: Performed by a 3rd Generation assay with a functional sensitivity of <=0.01 uIU/mL. Performed at [facility], [address], [place], [place]  [address]     Blood Alcohol level:  Lab Results  Component Value Date   ETH <10 10/14/2019    Metabolic Disorder Labs: No results found for: HGBA1C, MPG No results found for: PROLACTIN No results found for: CHOL, TRIG, HDL, CHOLHDL, VLDL, LDLCALC  Physical Findings: AIMS: Facial and Oral Movements Muscles of Facial Expression: None, normal Lips and Perioral Area: None, normal Jaw: None, normal Tongue: None, normal,Extremity Movements Upper (arms, wrists, hands, fingers): None, normal Lower (legs, knees, ankles, toes): None, normal, Trunk Movements Neck, shoulders, hips: None, normal, Overall Severity Severity of abnormal movements (highest score from questions above): None, normal Incapacitation due to abnormal movements: None, normal Patient's awareness of abnormal movements (rate only patient's report): No Awareness, Dental Status Current problems with teeth and/or dentures?: No Does patient usually wear dentures?: No  CIWA:    COWS:     Musculoskeletal: Strength & Muscle Tone: within normal limits  Gait & Station: normal Patient leans: N/A  Psychiatric Specialty Exam: Physical Exam Constitutional:      Appearance: Normal appearance.  HENT:     Head: Normocephalic and atraumatic.     Right Ear: External ear normal.     Left Ear: External ear normal.     Nose: Nose normal.     Mouth/Throat:     Mouth: Mucous membranes are moist.     Pharynx:  Oropharynx is clear.  Eyes:     Extraocular Movements: Extraocular movements intact.     Pupils: Pupils are equal, round, and reactive to light.  Cardiovascular:     Rate and Rhythm: Normal rate.     Pulses: Normal pulses.  Pulmonary:     Effort: Pulmonary effort is normal.     Breath sounds: Normal breath sounds.  Abdominal:     General: Abdomen is flat. There is no distension.  Musculoskeletal:        General: No swelling. Normal range of motion.     Cervical back: Normal range of motion. No rigidity.  Skin:    General: Skin is warm and dry.  Neurological:     General: No focal deficit present.     Mental Status: She is alert and oriented to person, place, and time.  Psychiatric:        Thought Content: Thought content normal.        Judgment: Judgment normal.     Review of Systems  Constitutional: Negative for activity change and appetite change.  HENT: Negative for rhinorrhea and sore throat.   Eyes: Negative for photophobia and visual disturbance.  Respiratory: Negative for cough and shortness of breath.   Cardiovascular: Negative for chest pain and palpitations.  Gastrointestinal: Negative for constipation, diarrhea, nausea and vomiting.  Endocrine: Negative for cold intolerance and heat intolerance.  Genitourinary: Negative for difficulty urinating and dysuria.  Musculoskeletal: Negative for back pain and myalgias.  Skin: Negative for rash and wound.  Allergic/Immunologic: Negative for food allergies and immunocompromised state.  Neurological: Negative for dizziness and headaches.  Hematological: Negative for adenopathy. Does not bruise/bleed easily.  Psychiatric/Behavioral: Positive for dysphoric mood. Negative for sleep disturbance and suicidal ideas. The patient is nervous/anxious.     Blood pressure 123/76, pulse 87, temperature 97.9 F (36.6 C), resp. rate 17, height 5\' 2"  (1.575 m), weight 52.2 kg, SpO2 99 %, currently breastfeeding.Body mass index is 21.03  kg/m.  General Appearance: Fairly Groomed  Eye Contact:  Good  Speech:  Normal Rate  Volume:  Normal  Mood:  Anxious  Affect:  Congruent  Thought Process:  Linear  Orientation:  Full (Time, Place, and Person)  Thought Content:  Logical  Suicidal Thoughts:  No  Homicidal Thoughts:  No  Memory:  Immediate;   Fair Recent;   Fair Remote;   Fair  Judgement:  Good  Insight:  Good  Psychomotor Activity:  Normal  Concentration:  Concentration: Fair and Attention Span: Fair  Recall:  [employer] of Knowledge:  Fair  Language:  Good  Akathisia:  Negative  Handed:  Right  AIMS (if indicated):     Assets:  Communication Skills Desire for Improvement Financial Resources/Insurance Housing Physical Health Resilience Social Support  ADL's:  Intact  Cognition:  WNL  Sleep:  Number of Hours: 7.5     Treatment Plan Summary: Daily contact with patient to assess and evaluate symptoms and progress in treatment, Medication management and Plan continue medications as above.  Jesse Sans, MD 10/21/2019, 1:08 PM

## 2019-10-21 NOTE — Plan of Care (Signed)
  Problem: Coping Skills Goal: STG - Patient will identify 3 positive coping skills strategies to use post d/c within 5 recreation therapy group sessions Description: STG - Patient will identify 3 positive coping skills strategies to use post d/c within 5 recreation therapy group sessions Outcome: Completed/Met

## 2019-10-21 NOTE — Progress Notes (Signed)
Recreation Therapy Notes  INPATIENT RECREATION TR PLAN  Patient Details Name: Marcia West MRN: [id number] DOB: Aug 18, 1985 Today's Date: 10/21/2019  Rec Therapy Plan Is patient appropriate for Therapeutic Recreation?: Yes Treatment times per week: at least 3 Estimated Length of Stay: 5-7 days TR Treatment/Interventions: Group participation (Comment)  Discharge Criteria Pt will be discharged from therapy if:: Discharged Treatment plan/goals/alternatives discussed and agreed upon by:: Patient/family  Discharge Summary Short term goals set: Patient will identify 3 positive coping skills strategies to use post d/c within 5 recreation therapy group sessions Short term goals met: Complete Progress toward goals comments: Groups attended Which groups?: Communication, Anger management, Self-esteem, Goal setting, Social skills Reason goals not met: N/A Therapeutic equipment acquired: N/A Reason patient discharged from therapy: Discharge from hospital Pt/family agrees with progress & goals achieved: Yes Date patient discharged from therapy: 10/21/19   Dannisha Eckmann 10/21/2019, 3:56 PM

## 2019-10-23 ENCOUNTER — Other Ambulatory Visit: Payer: Self-pay | Admitting: Psychiatry

## 2019-10-23 MED ORDER — ARIPIPRAZOLE 5 MG PO TABS
5.0000 mg | ORAL_TABLET | Freq: Every day | ORAL | 1 refills | Status: DC
Start: 2019-10-23 — End: 2019-12-01

## 2019-10-23 MED ORDER — LITHIUM CARBONATE ER 450 MG PO TBCR
450.0000 mg | EXTENDED_RELEASE_TABLET | Freq: Two times a day (BID) | ORAL | 1 refills | Status: DC
Start: 2019-10-23 — End: 2019-12-01

## 2019-10-23 MED ORDER — NICOTINE 21 MG/24HR TD PT24
21.0000 mg | MEDICATED_PATCH | Freq: Every day | TRANSDERMAL | 0 refills | Status: DC
Start: 2019-10-23 — End: 2019-12-01

## 2019-10-23 MED ORDER — CLONAZEPAM 1 MG PO TABS
1.0000 mg | ORAL_TABLET | Freq: Two times a day (BID) | ORAL | 1 refills | Status: DC
Start: 2019-10-23 — End: 2019-12-01

## 2019-10-23 MED ORDER — TRAZODONE HCL 150 MG PO TABS
150.0000 mg | ORAL_TABLET | Freq: Every evening | ORAL | 1 refills | Status: DC | PRN
Start: 2019-10-23 — End: 2019-12-01

## 2019-10-24 ENCOUNTER — Telehealth: Payer: Self-pay

## 2019-10-24 NOTE — Telephone Encounter (Signed)
This is not my patient.

## 2019-10-24 NOTE — Telephone Encounter (Signed)
Transition Care Management Follow-up Telephone Call  Date of discharge and from where: 10/21/2019 from [facility]  Any questions or concerns? No patient asked that I give her a call back. I will call her later this afternoon.

## 2019-10-24 NOTE — Telephone Encounter (Signed)
received a fax that prior auth was needed for aripiprazole 5mg 

## 2019-10-24 NOTE — Telephone Encounter (Signed)
went online and submited a prior auth .  it was approved from  10-24-19 to  10-23-20 case id # [id number]

## 2019-10-24 NOTE — Telephone Encounter (Signed)
Transition Care Management Follow-up Telephone Call  Date of discharge and from where: 10/21/2019 from [facility]  How have you been since you were released from the hospital? Patient states that she was doing well.   Any questions or concerns? No  Items Reviewed:  Did the pt receive and understand the discharge instructions provided? Yes   Medications obtained and verified? Yes   Any new allergies since your discharge? Yes   Dietary orders reviewed? Yes  Do you have support at home? Yes   Functional Questionnaire: (I = Independent and D = Dependent) ADLs: I  Bathing/Dressing- I  Meal Prep- I  Eating- I  Maintaining continence- I  Transferring/Ambulation- I  Managing Meds- I  Follow up appointments reviewed:   Should receive a call to establish with a PCP - Patient is aware and agreed to establish.  Are transportation arrangements needed? No   If their condition worsens, is the pt aware to call PCP or go to the Emergency Dept.? Yes  Was the patient provided with contact information for the PCP's office or ED? Yes  Was to pt encouraged to call back with questions or concerns? Yes

## 2019-10-27 NOTE — Telephone Encounter (Addendum)
Pt called and stated that she was able to get an appointment at [facility] but it is not until the 25th of October. Pt is concerned because she will run out of suboxone rx before then and they would not send in enough to get her to her establish appointment and ED would rx additional medication for her either.   I spoke to pt about establishing with a primary care provider and pt agreed. I schedule her for [facility] for tomorrow at 5pm with the "covering provider". Pt stated that she would go to [facility] tomorrow and see if she can be seen as a walk in so she can continue her medications. Pt may keep or cancel the appointment depending on how long it takes at [facility] and if she can be seen tomorrow.   Pt given the address and phone number for the [facility] and [facility].

## 2019-10-28 ENCOUNTER — Ambulatory Visit: Payer: Medicaid Other

## 2019-10-31 ENCOUNTER — Encounter: Payer: Self-pay | Admitting: Internal Medicine

## 2019-11-01 ENCOUNTER — Ambulatory Visit (INDEPENDENT_AMBULATORY_CARE_PROVIDER_SITE_OTHER): Payer: Medicaid Other | Admitting: Internal Medicine

## 2019-11-01 ENCOUNTER — Other Ambulatory Visit: Payer: Self-pay

## 2019-11-01 DIAGNOSIS — F419 Anxiety disorder, unspecified: Secondary | ICD-10-CM

## 2019-11-01 DIAGNOSIS — F3110 Bipolar disorder, current episode manic without psychotic features, unspecified: Secondary | ICD-10-CM | POA: Diagnosis not present

## 2019-11-01 DIAGNOSIS — F1190 Opioid use, unspecified, uncomplicated: Secondary | ICD-10-CM | POA: Diagnosis not present

## 2019-11-01 MED ORDER — BUPRENORPHINE HCL-NALOXONE HCL 4-1 MG SL FILM: ORAL_FILM | SUBLINGUAL | 0 refills | Status: DC

## 2019-11-01 NOTE — Progress Notes (Signed)
   11/01/2019  Marcia West presents for follow up of opioid use disorder I have reviewed the prior induction visit, follow up visits, and telephone encounters relevant to opiate use disorder (OUD) treatment.   Current daily dose: Suboxone 4-1mg , was only taking 2mg  daily per patient  Date of Induction: 03/30/2018  Current follow up interval, in weeks: 4 weeks   The patient has been adherent with the buprenorphine for OUD contract.   Last UDS Result: 10/14/2019 - Expected   HPI: [name] is a 34 year old female with PMHx as listed below presenting for opioid use disorder. Patient was recently admitted to the hospital with acute mania and psychosis in setting of bipolar disorder and she was receiving 2mg  daily of suboxone. She was diagnosed with bipolar disorder and was started on lithium, abilify and clonopin and trazodone for sleep. She has been unable to get her suboxone and has had intense cravings but has not relapsed yet. She does note a few drinks during this time.    Please see problem based charting for complete assessment and plan.   Past Medical History:  Diagnosis Date  . Anxiety   . Bipolar affective (HCC)   . Depression   . Herpes simplex type II infection   . Substance abuse (HCC)    Exam:   Vitals:   11/01/19 0946  BP: 122/78  Weight: 128 lb 6.4 oz (58.2 kg)    Physical Exam  Constitutional: Appears well-developed and well-nourished. No distress.  Cardiovascular: Normal rate, regular rhythm, S1 and S2 present, no murmurs, rubs, gallops.  Distal pulses intact Respiratory: No respiratory distress, no accessory muscle use.  Effort is normal.  Lungs are clear to auscultation bilaterally. Musculoskeletal: Normal bulk and tone.  No peripheral edema noted. Skin: Warm and dry.  No rash, erythema, lesions noted. Psychiatric: Normal mood and affect. Behavior is normal. Judgment and thought content normal.   Assessment/Plan:  See Problem Based Charting in the  Encounters Tab  , MD IMTS PGY-2 11/01/2019  9:50 AM

## 2019-11-01 NOTE — Patient Instructions (Addendum)
Marcia West,  It was a pleasure seeing you in clinic. Today we discussed:   Opioid use: We will send in a prescription for suboxone to [facility] for [facility] weeks.   If you have any questions or concerns, please call our clinic at [phone] between 9am-5pm and after hours call [phone] and ask for the internal medicine resident on call. If you feel you are having a medical emergency please call 911.   Thank you, we look forward to helping you remain healthy!   If you have not already done so, I recommend getting the COVID 19 vaccine.  To schedule an appointment for a COVID vaccine or be added to the vaccine wait list: Go to [email or web address]   OR Go to [email or web address]                  OR Call [phone]                                     OR Call [phone] and select Option 2

## 2019-11-02 NOTE — Assessment & Plan Note (Addendum)
This year was recently hospitalized for acute mania in setting of bipolar disorder without psychotic features.  She was started on lithium, Abilify, clonidine, trazodone during this hospitalization with improvement in symptoms.  Today, her PHQ-9 elevated to 24 and GAD-7 is also elevated at 21.  She notes that she was stressing therapist at [facility].  She denies any suicidal or homicidal ideation at this time.  Plan -Lithium 450 mg every 12 hours -Abilify 5 mg daily -Klonopin 1 mg twice daily Trazodone 150 mg nightly as needed -Follow-up with psychiatry at [facility]

## 2019-11-02 NOTE — Assessment & Plan Note (Signed)
Marcia West is presenting for follow-up from her recent hospitalization and is requesting a short course of Suboxone.  She was previously weaning her Suboxone 4-1 mg one fourth film daily.  Patient was recently admitted 10/2 through 10/8 for bipolar disorder with mania without psychotic features.  During her hospitalization, patient was given 2 mg Suboxone daily.  On discharge, patient was started on multiple antipsychotic medications including lithium, Abilify, Klonopin, trazodone.  She is to follow-up with [facility] for her psychiatric and opioid use disorder on 10/25.  She is requesting a short-term course of Suboxone until she is able to be seen.  She denies any cravings or relapses.  Plan -Suboxone 4-1 mg 1/2 film daily -Follow-up with [facility]

## 2019-11-03 NOTE — Progress Notes (Signed)
Internal Medicine Clinic Attending ° °Case discussed with Dr. Aslam  At the time of the visit.  We reviewed the resident’s history and exam and pertinent patient test results.  I agree with the assessment, diagnosis, and plan of care documented in the resident’s note.  °

## 2019-11-07 DIAGNOSIS — F3111 Bipolar disorder, current episode manic without psychotic features, mild: Secondary | ICD-10-CM | POA: Diagnosis not present

## 2019-11-10 ENCOUNTER — Telehealth: Payer: Self-pay | Admitting: *Deleted

## 2019-11-10 DIAGNOSIS — F3110 Bipolar disorder, current episode manic without psychotic features, unspecified: Secondary | ICD-10-CM

## 2019-11-10 NOTE — Telephone Encounter (Signed)
From my conversation with patient, it sounds like [facility] is not willing to manage her psych meds at all. When would you like her to f/u in OUD clinic? L. Teal Raben, BSN, RN-BC

## 2019-11-10 NOTE — Telephone Encounter (Signed)
Patient requested appt for 11/29/2019. Given appt at 1015 on that day. Can a referral be placed to Psychiatry? L. Kees Idrovo, BSN, RN-BC

## 2019-11-10 NOTE — Telephone Encounter (Signed)
Yeah, ok. Maybe Marcia West is just counseling? She can make a OUD or ACC appointment with [place] to sort this out. Given lithium is involved, we will want her to have a psychiatrist somewhere.

## 2019-11-10 NOTE — Telephone Encounter (Signed)
Patient called in stating she had her appt at [facility] on 11/07/2019. They wrote her a week's worth of her Rxs but told her they cannot refill klonopin or suboxone so she should find someplace that can refill all her Rxs. Are there any other options? Should she schedule f/u at [facility]? Please advise. Kinnie Feil, BSN, RN-BC

## 2019-11-10 NOTE — Telephone Encounter (Signed)
We would be happy to continue managing the opioid use disorder and prescribe suboxone. Given the complexity of her bipolar disorder, we would like her to continue seeing a psychiatrist at [facility] to manage the bipolar medications, which includes lithium and klonipin.

## 2019-11-10 NOTE — Telephone Encounter (Signed)
Sure. I am surprised a new referral is required. She was admitted to the psychiatry service in early October for 6 days. Should have had follow up after that admission.

## 2019-11-17 DIAGNOSIS — Z79899 Other long term (current) drug therapy: Secondary | ICD-10-CM | POA: Diagnosis not present

## 2019-11-22 ENCOUNTER — Encounter: Payer: Self-pay | Admitting: Internal Medicine

## 2019-11-23 ENCOUNTER — Emergency Department
Admission: EM | Admit: 2019-11-23 | Discharge: 2019-11-23 | Disposition: A | Discharge status: Other Acute Inpatient Hospital | Payer: Medicaid Other | Attending: Emergency Medicine | Admitting: Emergency Medicine

## 2019-11-23 ENCOUNTER — Other Ambulatory Visit: Payer: Self-pay

## 2019-11-23 ENCOUNTER — Inpatient Hospital Stay
Admission: RE | Admit: 2019-11-23 | Discharge: 2019-12-01 | DRG: 885 | Disposition: A | Discharge status: Home / Self Care | Payer: Medicaid Other | Source: Intra-hospital | Attending: Behavioral Health | Admitting: Behavioral Health

## 2019-11-23 ENCOUNTER — Encounter: Payer: Self-pay | Admitting: Psychiatry

## 2019-11-23 ENCOUNTER — Encounter: Payer: Self-pay | Admitting: Emergency Medicine

## 2019-11-23 DIAGNOSIS — F17210 Nicotine dependence, cigarettes, uncomplicated: Secondary | ICD-10-CM | POA: Insufficient documentation

## 2019-11-23 DIAGNOSIS — F1190 Opioid use, unspecified, uncomplicated: Secondary | ICD-10-CM | POA: Diagnosis present

## 2019-11-23 DIAGNOSIS — Z9114 Patient's other noncompliance with medication regimen: Secondary | ICD-10-CM | POA: Diagnosis not present

## 2019-11-23 DIAGNOSIS — F319 Bipolar disorder, unspecified: Secondary | ICD-10-CM | POA: Diagnosis present

## 2019-11-23 DIAGNOSIS — F419 Anxiety disorder, unspecified: Secondary | ICD-10-CM | POA: Diagnosis present

## 2019-11-23 DIAGNOSIS — F3110 Bipolar disorder, current episode manic without psychotic features, unspecified: Secondary | ICD-10-CM | POA: Diagnosis present

## 2019-11-23 DIAGNOSIS — Z833 Family history of diabetes mellitus: Secondary | ICD-10-CM | POA: Diagnosis not present

## 2019-11-23 DIAGNOSIS — F1510 Other stimulant abuse, uncomplicated: Secondary | ICD-10-CM | POA: Diagnosis not present

## 2019-11-23 DIAGNOSIS — Y906 Blood alcohol level of 120-199 mg/100 ml: Secondary | ICD-10-CM | POA: Diagnosis not present

## 2019-11-23 DIAGNOSIS — F3175 Bipolar disorder, in partial remission, most recent episode depressed: Secondary | ICD-10-CM | POA: Insufficient documentation

## 2019-11-23 DIAGNOSIS — F1290 Cannabis use, unspecified, uncomplicated: Secondary | ICD-10-CM | POA: Diagnosis not present

## 2019-11-23 DIAGNOSIS — F1410 Cocaine abuse, uncomplicated: Secondary | ICD-10-CM | POA: Diagnosis present

## 2019-11-23 DIAGNOSIS — Z818 Family history of other mental and behavioral disorders: Secondary | ICD-10-CM

## 2019-11-23 DIAGNOSIS — Z79899 Other long term (current) drug therapy: Secondary | ICD-10-CM | POA: Diagnosis not present

## 2019-11-23 DIAGNOSIS — R45851 Suicidal ideations: Secondary | ICD-10-CM | POA: Diagnosis present

## 2019-11-23 DIAGNOSIS — F5105 Insomnia due to other mental disorder: Secondary | ICD-10-CM | POA: Diagnosis present

## 2019-11-23 DIAGNOSIS — F1120 Opioid dependence, uncomplicated: Secondary | ICD-10-CM | POA: Diagnosis present

## 2019-11-23 DIAGNOSIS — F3130 Bipolar disorder, current episode depressed, mild or moderate severity, unspecified: Principal | ICD-10-CM | POA: Diagnosis present

## 2019-11-23 DIAGNOSIS — Z8261 Family history of arthritis: Secondary | ICD-10-CM

## 2019-11-23 DIAGNOSIS — Z20822 Contact with and (suspected) exposure to covid-19: Secondary | ICD-10-CM | POA: Diagnosis not present

## 2019-11-23 DIAGNOSIS — F1010 Alcohol abuse, uncomplicated: Secondary | ICD-10-CM | POA: Diagnosis present

## 2019-11-23 DIAGNOSIS — F3176 Bipolar disorder, in full remission, most recent episode depressed: Secondary | ICD-10-CM

## 2019-11-23 LAB — TSH: TSH: 1.047 u[IU]/mL (ref 0.350–4.500)

## 2019-11-23 LAB — URINE DRUG SCREEN, QUALITATIVE (ARMC ONLY)
Amphetamines, Ur Screen: NOT DETECTED
Barbiturates, Ur Screen: NOT DETECTED
Benzodiazepine, Ur Scrn: POSITIVE — AB
Cannabinoid 50 Ng, Ur ~~LOC~~: POSITIVE — AB
Cocaine Metabolite,Ur ~~LOC~~: POSITIVE — AB
MDMA (Ecstasy)Ur Screen: NOT DETECTED
Methadone Scn, Ur: NOT DETECTED
Opiate, Ur Screen: NOT DETECTED
Phencyclidine (PCP) Ur S: NOT DETECTED
Tricyclic, Ur Screen: NOT DETECTED

## 2019-11-23 LAB — CBC
HCT: 41.3 % (ref 36.0–46.0)
Hemoglobin: 13.9 g/dL (ref 12.0–15.0)
MCH: 32 pg (ref 26.0–34.0)
MCHC: 33.7 g/dL (ref 30.0–36.0)
MCV: 94.9 fL (ref 80.0–100.0)
Platelets: 259 10*3/uL (ref 150–400)
RBC: 4.35 MIL/uL (ref 3.87–5.11)
RDW: 12.2 % (ref 11.5–15.5)
WBC: 10.4 10*3/uL (ref 4.0–10.5)
nRBC: 0 % (ref 0.0–0.2)

## 2019-11-23 LAB — LITHIUM LEVEL: Lithium Lvl: 0.13 mmol/L — ABNORMAL LOW (ref 0.60–1.20)

## 2019-11-23 LAB — COMPREHENSIVE METABOLIC PANEL
ALT: 13 U/L (ref 0–44)
AST: 17 U/L (ref 15–41)
Albumin: 4.8 g/dL (ref 3.5–5.0)
Alkaline Phosphatase: 39 U/L (ref 38–126)
Anion gap: 9 (ref 5–15)
BUN: 14 mg/dL (ref 6–20)
CO2: 26 mmol/L (ref 22–32)
Calcium: 9.3 mg/dL (ref 8.9–10.3)
Chloride: 104 mmol/L (ref 98–111)
Creatinine, Ser: 0.69 mg/dL (ref 0.44–1.00)
GFR, Estimated: >60 mL/min (ref >60)
Glucose, Bld: 119 mg/dL — ABNORMAL HIGH (ref 70–99)
Potassium: 3.5 mmol/L (ref 3.5–5.1)
Sodium: 139 mmol/L (ref 135–145)
Total Bilirubin: 0.8 mg/dL (ref 0.3–1.2)
Total Protein: 8.1 g/dL (ref 6.5–8.1)

## 2019-11-23 LAB — ETHANOL: Alcohol, Ethyl (B): 19 mg/dL — ABNORMAL HIGH (ref <10)

## 2019-11-23 LAB — RESPIRATORY PANEL BY RT PCR (FLU A&B, COVID)
Influenza A by PCR: NEGATIVE
Influenza B by PCR: NEGATIVE
SARS Coronavirus 2 by RT PCR: NEGATIVE

## 2019-11-23 LAB — POC URINE PREG, ED: Preg Test, Ur: NEGATIVE

## 2019-11-23 LAB — ACETAMINOPHEN LEVEL: Acetaminophen (Tylenol), Serum: <10 ug/mL — ABNORMAL LOW (ref 10–30)

## 2019-11-23 LAB — SALICYLATE LEVEL: Salicylate Lvl: <7 mg/dL — ABNORMAL LOW (ref 7.0–30.0)

## 2019-11-23 MED ORDER — ARIPIPRAZOLE 5 MG PO TABS
5.0000 mg | ORAL_TABLET | Freq: Every day | ORAL | Status: DC
  Administered 2019-11-24: 5 mg via ORAL
  Filled 2019-11-23: qty 1

## 2019-11-23 MED ORDER — BUPRENORPHINE HCL-NALOXONE HCL 2-0.5 MG SL SUBL
1.0000 | SUBLINGUAL_TABLET | Freq: Every day | SUBLINGUAL | Status: DC
  Administered 2019-11-24 – 2019-11-30 (×7): 1 via SUBLINGUAL
  Filled 2019-11-23 (×7): qty 1

## 2019-11-23 MED ORDER — ALUM & MAG HYDROXIDE-SIMETH 200-200-20 MG/5ML PO SUSP: 30.0000 mL | ORAL | Status: DC | PRN

## 2019-11-23 MED ORDER — NICOTINE 21 MG/24HR TD PT24
21.0000 mg | MEDICATED_PATCH | Freq: Every day | TRANSDERMAL | Status: DC
  Administered 2019-11-24 – 2019-12-01 (×8): 21 mg via TRANSDERMAL
  Filled 2019-11-23 (×8): qty 1

## 2019-11-23 MED ORDER — ARIPIPRAZOLE 5 MG PO TABS
5.0000 mg | ORAL_TABLET | Freq: Every day | ORAL | Status: DC
  Administered 2019-11-23: 5 mg via ORAL
  Filled 2019-11-23: qty 1

## 2019-11-23 MED ORDER — CLONAZEPAM 1 MG PO TABS
1.0000 mg | ORAL_TABLET | Freq: Two times a day (BID) | ORAL | Status: DC
  Administered 2019-11-24 – 2019-12-01 (×16): 1 mg via ORAL
  Filled 2019-11-23 (×16): qty 1

## 2019-11-23 MED ORDER — ACETAMINOPHEN 325 MG PO TABS: 650.0000 mg | ORAL_TABLET | ORAL | Status: DC | PRN

## 2019-11-23 MED ORDER — TRAZODONE HCL 50 MG PO TABS
150.0000 mg | ORAL_TABLET | Freq: Every evening | ORAL | Status: DC | PRN
  Administered 2019-11-23 – 2019-11-30 (×8): 150 mg via ORAL
  Filled 2019-11-23 (×8): qty 1

## 2019-11-23 MED ORDER — BUPRENORPHINE HCL-NALOXONE HCL 2-0.5 MG SL SUBL
1.0000 | SUBLINGUAL_TABLET | Freq: Every day | SUBLINGUAL | Status: DC
  Administered 2019-11-23: 1 via SUBLINGUAL
  Filled 2019-11-23: qty 1

## 2019-11-23 MED ORDER — MAGNESIUM HYDROXIDE 400 MG/5ML PO SUSP
30.0000 mL | Freq: Every day | ORAL | Status: DC | PRN
  Administered 2019-11-27: 30 mL via ORAL
  Filled 2019-11-23: qty 30

## 2019-11-23 MED ORDER — ACETAMINOPHEN 325 MG PO TABS
650.0000 mg | ORAL_TABLET | ORAL | Status: DC | PRN
  Administered 2019-11-28 – 2019-11-30 (×6): 650 mg via ORAL
  Filled 2019-11-23 (×6): qty 2

## 2019-11-23 MED ORDER — ONDANSETRON HCL 4 MG PO TABS: 4.0000 mg | ORAL_TABLET | Freq: Three times a day (TID) | ORAL | Status: DC | PRN

## 2019-11-23 MED ORDER — NICOTINE 21 MG/24HR TD PT24
21.0000 mg | MEDICATED_PATCH | Freq: Every day | TRANSDERMAL | Status: DC
  Administered 2019-11-23: 21 mg via TRANSDERMAL
  Filled 2019-11-23: qty 1

## 2019-11-23 MED ORDER — ALUM & MAG HYDROXIDE-SIMETH 200-200-20 MG/5ML PO SUSP: 30.0000 mL | Freq: Four times a day (QID) | ORAL | Status: DC | PRN

## 2019-11-23 MED ORDER — ACETAMINOPHEN 325 MG PO TABS: 650.0000 mg | ORAL_TABLET | Freq: Four times a day (QID) | ORAL | Status: DC | PRN

## 2019-11-23 MED ORDER — CLONAZEPAM 1 MG PO TABS
1.0000 mg | ORAL_TABLET | Freq: Two times a day (BID) | ORAL | Status: DC
  Administered 2019-11-23: 1 mg via ORAL
  Filled 2019-11-23: qty 1

## 2019-11-23 NOTE — ED Notes (Signed)
Dr.Clapacs at bedside  

## 2019-11-23 NOTE — ED Notes (Signed)
Added to previous note of belongings: one pair leggings, one pair underwear, one purple jacket, one tshirt, one teal bra, two silver colored earrings and one silver colored belly button ring. 1/1 bag.

## 2019-11-23 NOTE — ED Notes (Signed)
1 pair black socks, 1 pair leopard print hey dudes, 1 pair pants,

## 2019-11-23 NOTE — ED Notes (Signed)
Patient is stable in NAD. She is calm and cooperative. She is transferred to [facility] via [facility]eron[facility]. Patient belongings transferred with patient. Report given to [facility]. Paper work taken with the patient. No issues.

## 2019-11-23 NOTE — ED Provider Notes (Signed)
[facility] Emergency Department Provider Note  ____________________________________________   First MD Initiated Contact with Patient 11/23/19 1500     (approximate)  I have reviewed the triage vital signs and the nursing notes.   HISTORY  Chief Complaint Depression    HPI Marcia West is a 34 y.o. female  With h/o bipolar disorder, sp recent admission, here with ongoing, slightly worsening depression and adverse reaction to medications. Pt states that she was just hospitalized and started on lithium, abilify, and klonopin. She states that since then she's felt like a "zombie." She feels like the medicine is sedating her and making her not care about anything. She is having difficulty finding motivation to do anything, including caring for herself and her 2 yo child. She is worried about her ability to live like this so presents for re-evaluation. She has been going to [facility] and her f/u appointments. No overt SI, HI or hallucinations. She does endorse some paranoia.    Past Medical History:  Diagnosis Date  . Anxiety   . Bipolar affective (HCC)   . Depression   . Herpes simplex type II infection   . Substance abuse [facility])     Patient Active Problem List   Diagnosis Date Noted  . Alcohol abuse 11/23/2019  . Cocaine abuse (HCC) 11/23/2019  . Bipolar 1 disorder, depressed (HCC) 11/23/2019  . Bipolar disorder, current episode manic without psychotic features (HCC) 10/15/2019  . Mania (HCC)   . 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 Anxiety and depression 11/05/2017  . Opioid use disorder 10/22/2017    Past Surgical History:  Procedure Laterality Date  . NO PAST SURGERIES    . WISDOM TOOTH EXTRACTION      Prior to Admission medications   Medication Sig Start Date End Date Taking? Authorizing Provider  ARIPiprazole (ABILIFY) 5 MG tablet Take 1 tablet (5 mg total) by mouth daily. 10/23/19   Clapacs, Jackquline Denmark, MD  Buprenorphine  HCl-Naloxone HCl 4-1 MG FILM Take 1/2 film daily SL. 11/01/19   Inez Catalina, MD  clonazePAM (KLONOPIN) 1 MG tablet Take 1 tablet (1 mg total) by mouth 2 (two) times daily. 10/23/19   Clapacs, Jackquline Denmark, MD  lithium carbonate (ESKALITH) 450 MG CR tablet Take 1 tablet (450 mg total) by mouth every 12 (twelve) hours. 10/23/19   Clapacs, Jackquline Denmark, MD  nicotine (NICODERM CQ - DOSED IN MG/24 HOURS) 21 mg/24hr patch Place 1 patch (21 mg total) onto the skin daily. 10/23/19   Clapacs, Jackquline Denmark, MD  traZODone (DESYREL) 150 MG tablet Take 1 tablet (150 mg total) by mouth at bedtime as needed for sleep. 10/23/19   Clapacs, Jackquline Denmark, MD    Allergies Patient has no known allergies.  Family History  Problem Relation Age of Onset  . Arthritis Maternal Grandmother   . Diabetes Maternal Grandmother   . Diabetes Maternal Grandfather   . Obesity Paternal Grandmother     Social History Social History   Tobacco Use  . Smoking status: Current Every Day Smoker    Packs/day: 0.25    Types: Cigarettes  . Smokeless tobacco: Never Used  . Tobacco comment: 4 cigs per day  Vaping Use  . Vaping Use: Never used  Substance Use Topics  . Alcohol use: Not Currently  . Drug use: Not Currently    Types: Methamphetamines, Other-see comments, Marijuana, "Crack" cocaine    Comment: suboxone daily    Review of Systems  Review of Systems  Constitutional: Positive for fatigue. Negative for fever.  HENT: Negative for congestion and sore throat.   Eyes: Negative for visual disturbance.  Respiratory: Negative for cough and shortness of breath.   Cardiovascular: Negative for chest pain.  Gastrointestinal: Negative for abdominal pain, diarrhea, nausea and vomiting.  Genitourinary: Negative for flank pain.  Musculoskeletal: Negative for back pain and neck pain.  Skin: Negative for rash and wound.  Neurological: Negative for weakness.  Psychiatric/Behavioral: Positive for dysphoric mood. The patient is nervous/anxious.    All other systems reviewed and are negative.    ____________________________________________  PHYSICAL EXAM:      VITAL SIGNS: ED Triage Vitals  Enc Vitals Group     BP 11/23/19 1421 131/80     Pulse Rate 11/23/19 1421 (!) 114     Resp 11/23/19 1421 20     Temp 11/23/19 1421 98.8 F (37.1 C)     Temp Source 11/23/19 1421 Oral     SpO2 11/23/19 1421 100 %     Weight 11/23/19 1422 125 lb (56.7 kg)     Height 11/23/19 1422 5\' 7"  (1.702 m)     Head Circumference --      Peak Flow --      Pain Score 11/23/19 1422 0     Pain Loc --      Pain Edu? --      Excl. in GC? --      Physical Exam Vitals and nursing note reviewed.  Constitutional:      General: She is not in acute distress.    Appearance: She is well-developed.  HENT:     Head: Normocephalic and atraumatic.  Eyes:     Conjunctiva/sclera: Conjunctivae normal.  Cardiovascular:     Rate and Rhythm: Normal rate and regular rhythm.     Heart sounds: Normal heart sounds.  Pulmonary:     Effort: Pulmonary effort is normal. No respiratory distress.     Breath sounds: No wheezing.  Abdominal:     General: There is no distension.  Musculoskeletal:     Cervical back: Neck supple.  Skin:    General: Skin is warm.     Capillary Refill: Capillary refill takes less than 2 seconds.     Findings: No rash.  Neurological:     Mental Status: She is alert and oriented to person, place, and time.     Motor: No abnormal muscle tone.  Psychiatric:        Mood and Affect: Mood is anxious and depressed.       ____________________________________________   LABS (all labs ordered are listed, but only abnormal results are displayed)  Labs Reviewed  COMPREHENSIVE METABOLIC PANEL - Abnormal; Notable for the following components:      Result Value   Glucose, Bld 119 (*)    All other components within normal limits  ETHANOL - Abnormal; Notable for the following components:   Alcohol, Ethyl (B) 19 (*)    All other components  within normal limits  SALICYLATE LEVEL - Abnormal; Notable for the following components:   Salicylate Lvl <7.0 (*)    All other components within normal limits  ACETAMINOPHEN LEVEL - Abnormal; Notable for the following components:   Acetaminophen (Tylenol), Serum <10 (*)    All other components within normal limits  URINE DRUG SCREEN, QUALITATIVE ([facility] ONLY) - Abnormal; Notable for the following components:   Cocaine Metabolite,Ur Mango POSITIVE (*)    Cannabinoid 50 Ng, Ur [place] POSITIVE (*)  Benzodiazepine, Ur Scrn POSITIVE (*)    All other components within normal limits  LITHIUM LEVEL - Abnormal; Notable for the following components:   Lithium Lvl 0.13 (*)    All other components within normal limits  RESPIRATORY PANEL BY RT PCR (FLU A&B, COVID)  CBC  TSH  POC URINE PREG, ED    ____________________________________________  EKG:  ________________________________________  RADIOLOGY All imaging, including plain films, CT scans, and ultrasounds, independently reviewed by me, and interpretations confirmed via formal radiology reads.  ED MD interpretation:     Official radiology report(s): No results found.  ____________________________________________  PROCEDURES   Procedure(s) performed (including Critical Care):  Procedures  ____________________________________________  INITIAL IMPRESSION / MDM / ASSESSMENT AND PLAN / ED COURSE  As part of my medical decision making, I reviewed the following data within the electronic MEDICAL RECORD NUMBER Nursing notes reviewed and incorporated, Old chart reviewed, Notes from prior ED visits, and [place] Controlled Substance Database       *Marcia West was evaluated in Emergency Department on 11/23/2019 for the symptoms described in the history of present illness. She was evaluated in the context of the global COVID-19 pandemic, which necessitated consideration that the patient might be at risk for infection with the SARS-CoV-2 virus that  causes COVID-19. Institutional protocols and algorithms that pertain to the evaluation of patients at risk for COVID-19 are in a state of rapid change based on information released by regulatory bodies including the CDC and federal and state organizations. These policies and algorithms were followed during the patient's care in the ED.  Some ED evaluations and interventions may be delayed as a result of limited staffing during the pandemic.*     Medical Decision Making:  34 yo F here with ongoing depression, feeling of "being a zombie" after recent admission. Labs show mild EtOH elevation, otherwise are unremarkable. While she denies overt SI, HI, she does endorse depression, difficulty caring for herself @ home. Will consult Psych, TTS. Medically stable. Lithium level pending.  Lithium level slightly subtherapeutic. Dr. Toni Amend has seen, will be admitted voluntarily. Likely tomorrow AM.  The patient has been placed in psychiatric observation due to the need to provide a safe environment for the patient while obtaining psychiatric consultation and evaluation, as well as ongoing medical and medication management to treat the patient's condition.  The patient has not been placed under full IVC at this time.   ____________________________________________  FINAL CLINICAL IMPRESSION(S) / ED DIAGNOSES  Final diagnoses:  Bipolar disorder, in full remission, most recent episode depressed (HCC)     MEDICATIONS GIVEN DURING THIS VISIT:  Medications  alum & mag hydroxide-simeth (MAALOX/MYLANTA) 200-200-20 MG/5ML suspension 30 mL (has no administration in time range)  acetaminophen (TYLENOL) tablet 650 mg (has no administration in time range)  ondansetron (ZOFRAN) tablet 4 mg (has no administration in time range)  nicotine (NICODERM CQ - dosed in mg/24 hours) patch 21 mg (21 mg Transdermal Patch Applied 11/23/19 1734)  ARIPiprazole (ABILIFY) tablet 5 mg (5 mg Oral Given 11/23/19 1734)   buprenorphine-naloxone (SUBOXONE) 2-0.5 mg per SL tablet 1 tablet (1 tablet Sublingual Given 11/23/19 1735)  clonazePAM (KLONOPIN) tablet 1 mg (has no administration in time range)     ED Discharge Orders    None       Note:  This document was prepared using Dragon voice recognition software and may include unintentional dictation errors.   Shaune Pollack, MD 11/23/19 1744

## 2019-11-23 NOTE — BH Assessment (Signed)
Assessment Note  Marcia West is an 34 y.o. female who presents to the ER due to increase symptoms of depression. Per her report, since her last admission approximately, two weeks ago, her crying spells have increased, motivation to complete daily tasks and responsibilities have decreased. She spends majority of her time in the bed crying and doesn't know why.  She feels worthless, helpless, hopeless and have no reason to live or anything to offer. She stopped taking her lithium two days ago because it made her feel like a zombie. She further reports, she followed up with the appointments with her outpatient provider but the results wasn't favorable. She was told to transfer to another clinic and was unable to afford the prescribed medications. As a result, she started drinking alcohol daily, as well as, abusing cocaine and cannabis. She states, it was her way to cope and self-medicate.  During the interview the patient was calm, cooperative and pleasant. She was able to provide appropriate answers to the questions. Throughout the interview, she denied SI/HI and AV/H. She denies history of violence and aggression.  Diagnosis: Major Depression  Past Medical History:  Past Medical History:  Diagnosis Date  . Anxiety   . Bipolar affective (HCC)   . Depression   . Herpes simplex type II infection   . Substance abuse [facility])     Past Surgical History:  Procedure Laterality Date  . NO PAST SURGERIES    . WISDOM TOOTH EXTRACTION      Family History:  Family History  Problem Relation Age of Onset  . Arthritis Maternal Grandmother   . Diabetes Maternal Grandmother   . Diabetes Maternal Grandfather   . Obesity Paternal Grandmother     Social History:  reports that she has been smoking cigarettes. She has been smoking about 0.25 packs per day. She has never used smokeless tobacco. She reports previous alcohol use. She reports previous drug use. Drugs: Methamphetamines, Other-see comments,  Marijuana, and "Crack" cocaine.  Additional Social History:  Alcohol / Drug Use Pain Medications: See PTA Prescriptions: See PTA Over the Counter: See PTA History of alcohol / drug use?: Yes Longest period of sobriety (when/how long): Unable to quantify Substance #1 Name of Substance 1: Alcohol 1 - Last Use / Amount: 11/22/2019 Substance #2 Name of Substance 2: Cocaine 2 - Last Use / Amount: 11/22/2019 Substance #3 Name of Substance 3: Cannabis 3 - Last Use / Amount: 11/22/2019  CIWA: CIWA-Ar BP: 131/80 Pulse Rate: (!) 114 COWS:    Allergies: No Known Allergies  Home Medications: (Not in a hospital admission)   OB/GYN Status:  Patient's last menstrual period was 11/18/2019.  General Assessment Data Location of Assessment: [facility] ED TTS Assessment: In system Is this a Tele or Face-to-Face Assessment?: Face-to-Face Is this an Initial Assessment or a Re-assessment for this encounter?: Initial Assessment Patient Accompanied by:: N/A Language Other than English: No Living Arrangements: Other (Comment) (Private Home) What gender do you identify as?: Female Date Telepsych consult ordered in [facility]: 11/23/19 Time Telepsych consult ordered in [facility]: 1505 Marital status: Single Pregnancy Status: No Living Arrangements: Parent Can pt return to current living arrangement?: Yes Admission Status: Voluntary Is patient capable of signing voluntary admission?: Yes Referral Source: Self/Family/Friend Insurance type: Medicaid  Medical Screening Exam [facility] Walk-in ONLY) Medical Exam completed: Yes  Crisis Care Plan Living Arrangements: Parent Legal Guardian: Other: (Self) Name of Psychiatrist: [facility] Name of Therapist: [facility]  Education Status Is patient currently in school?: No Is  the patient employed, unemployed or receiving disability?: Employed  Risk to self with the past 6 months Suicidal Ideation: No Has patient been a risk to self within the past 6  months prior to admission? : No Suicidal Intent: No Has patient had any suicidal intent within the past 6 months prior to admission? : No Is patient at risk for suicide?: No Suicidal Plan?: No Has patient had any suicidal plan within the past 6 months prior to admission? : No Access to Means: No What has been your use of drugs/alcohol within the last 12 months?: Alcohol, cocaine & cannabis Previous Attempts/Gestures: No How many times?: 0 Other Self Harm Risks: Reports of none Triggers for Past Attempts: None known Intentional Self Injurious Behavior: None Family Suicide History: Unknown Recent stressful life event(s): Other (Comment) Persecutory voices/beliefs?: No Depression: Yes Depression Symptoms: Tearfulness, Isolating, Loss of interest in usual pleasures, Feeling worthless/self pity Substance abuse history and/or treatment for substance abuse?: Yes Suicide prevention information given to non-admitted patients: Not applicable  Risk to Others within the past 6 months Homicidal Ideation: No Does patient have any lifetime risk of violence toward others beyond the six months prior to admission? : No Thoughts of Harm to Others: No Current Homicidal Intent: No Current Homicidal Plan: No Access to Homicidal Means: No Identified Victim: Reports of none History of harm to others?: No Assessment of Violence: None Noted Violent Behavior Description: Reports of none Does patient have access to weapons?: No Criminal Charges Pending?: No Does patient have a court date: No Is patient on probation?: No  Psychosis Hallucinations: None noted Delusions: None noted  Mental Status Report Appearance/Hygiene: Unremarkable, In scrubs Eye Contact: Good Motor Activity: Freedom of movement, Unremarkable Speech: Unremarkable Level of Consciousness: Alert Mood: Depressed, Sad, Pleasant Affect: Appropriate to circumstance, Depressed, Sad Anxiety Level: Minimal Thought Processes: Coherent,  Relevant Judgement: Unimpaired Orientation: Person, Place, Time, Situation, Appropriate for developmental age Obsessive Compulsive Thoughts/Behaviors: None  Cognitive Functioning Concentration: Normal Memory: Recent Intact, Remote Intact Is patient IDD: No Insight: Fair Impulse Control: Good Appetite: Poor Have you had any weight changes? : Loss Amount of the weight change? (lbs): 10 lbs (Within the last 30 days) Sleep: No Change Total Hours of Sleep: 0 Vegetative Symptoms: None  ADLScreening [facility]) Patient's cognitive ability adequate to safely complete daily activities?: Yes Patient able to express need for assistance with ADLs?: Yes Independently performs ADLs?: Yes (appropriate for developmental age)  Prior Inpatient Therapy Prior Inpatient Therapy: Yes Prior Therapy Dates: 10/2019 Prior Therapy Facilty/Provider(s): [facility] Reason for Treatment: Bipolar  Prior Outpatient Therapy Prior Outpatient Therapy: Yes Prior Therapy Dates: Currently Prior Therapy Facilty/Provider(s): [facility] Reason for Treatment: Substance Abuse, Depression, Anxiety Does patient have an ACCT team?: No Does patient have Intensive In-House Services?  : No Does patient have [facility] services? : No Does patient have P4CC services?: No  ADL Screening (condition at time of admission) Patient's cognitive ability adequate to safely complete daily activities?: Yes Is the patient deaf or have difficulty hearing?: No Does the patient have difficulty seeing, even when wearing glasses/contacts?: No Does the patient have difficulty concentrating, remembering, or making decisions?: No Patient able to express need for assistance with ADLs?: Yes Does the patient have difficulty dressing or bathing?: No Independently performs ADLs?: Yes (appropriate for developmental age) Does the patient have difficulty walking or climbing stairs?: No Weakness of Legs: None Weakness of Arms/Hands:  None  Home Assistive Devices/Equipment Home Assistive Devices/Equipment: None  Therapy Consults (therapy  consults require a physician order) PT Evaluation Needed: No OT Evalulation Needed: No SLP Evaluation Needed: No Abuse/Neglect Assessment (Assessment to be complete while patient is alone) Abuse/Neglect Assessment Can Be Completed: Yes Physical Abuse: Denies Verbal Abuse: Denies Sexual Abuse: Denies Exploitation of patient/patient's resources: Denies Self-Neglect: Denies Values / Beliefs Cultural Requests During Hospitalization: None Spiritual Requests During Hospitalization: None Consults Spiritual Care Consult Needed: No Transition of Care Team Consult Needed: No Advance Directives (For Healthcare) Does Patient Have a Medical Advance Directive?: No  Disposition:  Disposition Initial Assessment Completed for this Encounter: Yes  On Site Evaluation by:   Reviewed with Physician:    Lilyan Gilford MS, LCAS, [facility], NCC Therapeutic Triage Specialist 11/23/2019 4:58 PM

## 2019-11-23 NOTE — ED Notes (Signed)
Assumed care of patient. Patient speaks in low tone reports stopped taking her lithium 2 days ago and does not feel like that medication is for her. Reports feling like a zombie on that medication here to get back on meds.

## 2019-11-23 NOTE — Consult Note (Signed)
[facility]   Reason for Consult: Consult for this 34 year old woman with a past history of bipolar disorder who comes into the hospital complaining of depression Referring Physician: Isaac's Patient Identification: Marcia West MRN:  [id number] Principal Diagnosis: Bipolar 1 disorder, depressed (HCC) Diagnosis:  Principal Problem:   Bipolar 1 disorder, depressed (HCC) Active Problems:   Opioid use disorder   Bipolar disorder, current episode manic without psychotic features (HCC)   Alcohol abuse   Cocaine abuse (HCC)   Total Time spent with patient: 1 hour  Subjective:   Marcia West is a 34 y.o. female patient admitted with "I have never been so depressed".  HPI: Patient seen chart reviewed.  34 year old woman who was recently discharged from the inpatient unit about a month ago comes back reporting depression.  She says that after she left she continued the prescribed medication including the lithium.  She admits that her depression was improved at first but says that she felt like the lithium was making her feel "like a zombie".  She discontinued it about 2 days ago.  She says she has been feeling depressed for over a week.  Mood is sad down and negative.  Poor hygiene and poor self-care.  Poor sleep.  Denies hallucinations or psychotic symptoms.  Passive suicidal thoughts but no intent to kill her self.  Not eating well.  Admits that she has been drinking alcohol daily.  At first says it is only "a couple beers" but then says it can be as much as a bottle of wine.  Admits that she used crack cocaine last night but claims that she has not used drugs any other time.  [facility] but feels hopeless and frightened and uncertain about discharge.  Past Psychiatric History: Past history of bipolar disorder with a fairly clear-cut psychosis and mania last time she was in the hospital.  Prior history of substance abuse.  Multiple antipsychotics  tried in the past.  Has complained of side effects many times and has had trouble being compliant  Risk to Self:   Risk to Others:   Prior Inpatient Therapy:   Prior Outpatient Therapy:    Past Medical History:  Past Medical History:  Diagnosis Date  . Anxiety   . Bipolar affective (HCC)   . Depression   . Herpes simplex type II infection   . Substance abuse [facility](HCC)     Past Surgical History:  Procedure Laterality Date  . NO PAST SURGERIES    . WISDOM TOOTH EXTRACTION     Family History:  Family History  Problem Relation Age of Onset  . Arthritis Maternal Grandmother   . Diabetes Maternal Grandmother   . Diabetes Maternal Grandfather   . Obesity Paternal Grandmother    Family Psychiatric  History: See previous.  None reported Social History:  Social History   Substance and Sexual Activity  Alcohol Use Not Currently     Social History   Substance and Sexual Activity  Drug Use Not Currently  . Types: Methamphetamines, Other-see comments, Marijuana, "Crack" cocaine   Comment: suboxone daily    Social History   Socioeconomic History  . Marital status: Single    Spouse name: Not on file  . Number of children: Not on file  . Years of education: Not on file  . Highest education level: Not on file  Occupational History  . Not on file  Tobacco Use  . Smoking status: Current Every Day Smoker  Packs/day: 0.25    Types: Cigarettes  . Smokeless tobacco: Never Used  . Tobacco comment: 4 cigs per day  Vaping Use  . Vaping Use: Never used  Substance and Sexual Activity  . Alcohol use: Not Currently  . Drug use: Not Currently    Types: Methamphetamines, Other-see comments, Marijuana, "Crack" cocaine    Comment: suboxone daily  . Sexual activity: Not Currently    Birth control/protection: None  Other Topics Concern  . Not on file  Social History Narrative  . Not on file   Social Determinants of Health   Financial Resource Strain:   . Difficulty of Paying  Living Expenses: Not on file  Food Insecurity:   . Worried About Programme researcher, broadcasting/film/video in the Last Year: Not on file  . Ran Out of Food in the Last Year: Not on file  Transportation Needs:   . Lack of Transportation (Medical): Not on file  . Lack of Transportation (Non-Medical): Not on file  Physical Activity:   . Days of Exercise per Week: Not on file  . Minutes of Exercise per Session: Not on file  Stress:   . Feeling of Stress : Not on file  Social Connections:   . Frequency of Communication with Friends and Family: Not on file  . Frequency of Social Gatherings with Friends and Family: Not on file  . Attends Religious Services: Not on file  . Active Member of Clubs or Organizations: Not on file  . Attends Banker Meetings: Not on file  . Marital Status: Not on file   Additional Social History:    Allergies:  No Known Allergies  Labs:  Results for orders placed or performed during the hospital encounter of 11/23/19 (from the past 48 hour(s))  Comprehensive metabolic panel     Status: Abnormal   Collection Time: 11/23/19  2:24 PM  Result Value Ref Range   Sodium 139 135 - 145 mmol/L   Potassium 3.5 3.5 - 5.1 mmol/L   Chloride 104 98 - 111 mmol/L   CO2 26 22 - 32 mmol/L   Glucose, Bld 119 (H) 70 - 99 mg/dL    Comment: Glucose reference range applies only to samples taken after fasting for at least 8 hours.   BUN 14 6 - 20 mg/dL   Creatinine, Ser 2.40 0.44 - 1.00 mg/dL   Calcium 9.3 8.9 - 97.3 mg/dL   Total Protein 8.1 6.5 - 8.1 g/dL   Albumin 4.8 3.5 - 5.0 g/dL   AST 17 15 - 41 U/L   ALT 13 0 - 44 U/L   Alkaline Phosphatase 39 38 - 126 U/L   Total Bilirubin 0.8 0.3 - 1.2 mg/dL   GFR, Estimated >53 >29 mL/min    Comment: (NOTE) Calculated using the CKD-EPI Creatinine Equation (2021)    Anion gap 9 5 - 15    Comment: Performed at [facility], [address], [place], [place] [address]  Ethanol     Status: Abnormal   Collection Time: 11/23/19   2:24 PM  Result Value Ref Range   Alcohol, Ethyl (B) 19 (H) <10 mg/dL    Comment: (NOTE) Lowest detectable limit for serum alcohol is 10 mg/dL.  For medical purposes only. Performed at [facility], [address] [place], [place] [address]   Salicylate level     Status: Abnormal   Collection Time: 11/23/19  2:24 PM  Result Value Ref Range   Salicylate Lvl <7.0 (L) 7.0 -  30.0 mg/dL    Comment: Performed at [facility], [address] [place], [place] [address]  Acetaminophen level     Status: Abnormal   Collection Time: 11/23/19  2:24 PM  Result Value Ref Range   Acetaminophen (Tylenol), Serum <10 (L) 10 - 30 ug/mL    Comment: (NOTE) Therapeutic concentrations vary significantly. A range of 10-30 ug/mL  may be an effective concentration for many patients. However, some  are best treated at concentrations outside of this range. Acetaminophen concentrations >150 ug/mL at 4 hours after ingestion  and >50 ug/mL at 12 hours after ingestion are often associated with  toxic reactions.  Performed at [facility], [address] [place], [place] [address]   cbc     Status: None   Collection Time: 11/23/19  2:24 PM  Result Value Ref Range   WBC 10.4 4.0 - 10.5 K/uL   RBC 4.35 3.87 - 5.11 MIL/uL   Hemoglobin 13.9 12.0 - 15.0 g/dL   HCT 09.8 36 - 46 %   MCV 94.9 80.0 - 100.0 fL   MCH 32.0 26.0 - 34.0 pg   MCHC 33.7 30.0 - 36.0 g/dL   RDW 11.9 14.7 - 82.9 %   Platelets 259 150 - 400 K/uL   nRBC 0.0 0.0 - 0.2 %    Comment: Performed at [facility], [address], [place], [place] [address]  Urine Drug Screen, Qualitative     Status: Abnormal   Collection Time: 11/23/19  2:24 PM  Result Value Ref Range   Tricyclic, Ur Screen NONE DETECTED NONE DETECTED   Amphetamines, Ur Screen NONE DETECTED NONE DETECTED   MDMA (Ecstasy)Ur Screen NONE DETECTED NONE DETECTED   Cocaine Metabolite,Ur [place] POSITIVE (A) NONE DETECTED   Opiate, Ur Screen NONE  DETECTED NONE DETECTED   Phencyclidine (PCP) Ur S NONE DETECTED NONE DETECTED   Cannabinoid 50 Ng, Ur [place] POSITIVE (A) NONE DETECTED   Barbiturates, Ur Screen NONE DETECTED NONE DETECTED   Benzodiazepine, Ur Scrn POSITIVE (A) NONE DETECTED   Methadone Scn, Ur NONE DETECTED NONE DETECTED    Comment: (NOTE) Tricyclics + metabolites, urine    Cutoff 1000 ng/mL Amphetamines + metabolites, urine  Cutoff 1000 ng/mL MDMA (Ecstasy), urine              Cutoff 500 ng/mL Cocaine Metabolite, urine          Cutoff 300 ng/mL Opiate + metabolites, urine        Cutoff 300 ng/mL Phencyclidine (PCP), urine         Cutoff 25 ng/mL Cannabinoid, urine                 Cutoff 50 ng/mL Barbiturates + metabolites, urine  Cutoff 200 ng/mL Benzodiazepine, urine              Cutoff 200 ng/mL Methadone, urine                   Cutoff 300 ng/mL  The urine drug screen provides only a preliminary, unconfirmed analytical test result and should not be used for non-medical purposes. Clinical consideration and professional judgment should be applied to any positive drug screen result due to possible interfering substances. A more specific alternate chemical method must be used in order to obtain a confirmed analytical result. Gas chromatography / mass spectrometry (GC/MS) is the preferred confirm atory method. Performed at [facility], [address], [place], [place] [address]   POC urine  preg, ED     Status: None   Collection Time: 11/23/19  2:42 PM  Result Value Ref Range   Preg Test, Ur Negative Negative    Current Facility-Administered Medications  Medication Dose Route Frequency Provider Last Rate Last Admin  . acetaminophen (TYLENOL) tablet 650 mg  650 mg Oral Q4H PRN Shaune Pollack, MD      . alum & mag hydroxide-simeth (MAALOX/MYLANTA) 200-200-20 MG/5ML suspension 30 mL  30 mL Oral Q6H PRN Shaune Pollack, MD      . ARIPiprazole (ABILIFY) tablet 5 mg  5 mg Oral Daily Lliam Hoh T, MD       . buprenorphine-naloxone (SUBOXONE) 2-0.5 mg per SL tablet 1 tablet  1 tablet Sublingual Daily Stephens Shreve T, MD      . clonazePAM (KLONOPIN) tablet 1 mg  1 mg Oral BID Rhema Boyett T, MD      . nicotine (NICODERM CQ - dosed in mg/24 hours) patch 21 mg  21 mg Transdermal Daily Willoughby Doell T, MD      . ondansetron (ZOFRAN) tablet 4 mg  4 mg Oral Q8H PRN Shaune Pollack, MD       Current Outpatient Medications  Medication Sig Dispense Refill  . ARIPiprazole (ABILIFY) 5 MG tablet Take 1 tablet (5 mg total) by mouth daily. 30 tablet 1  . Buprenorphine HCl-Naloxone HCl 4-1 MG FILM Take 1/2 film daily SL. 7 each 0  . clonazePAM (KLONOPIN) 1 MG tablet Take 1 tablet (1 mg total) by mouth 2 (two) times daily. 30 tablet 1  . lithium carbonate (ESKALITH) 450 MG CR tablet Take 1 tablet (450 mg total) by mouth every 12 (twelve) hours. 60 tablet 1  . nicotine (NICODERM CQ - DOSED IN MG/24 HOURS) 21 mg/24hr patch Place 1 patch (21 mg total) onto the skin daily. 28 patch 0  . traZODone (DESYREL) 150 MG tablet Take 1 tablet (150 mg total) by mouth at bedtime as needed for sleep. 30 tablet 1    Musculoskeletal: Strength & Muscle Tone: within normal limits Gait & Station: normal Patient leans: N/A  Psychiatric Specialty Exam: Physical Exam Vitals and nursing note reviewed.  Constitutional:      Appearance: She is well-developed.  HENT:     Head: Normocephalic and atraumatic.  Eyes:     Conjunctiva/sclera: Conjunctivae normal.     Pupils: Pupils are equal, round, and reactive to light.  Cardiovascular:     Heart sounds: Normal heart sounds.  Pulmonary:     Effort: Pulmonary effort is normal.  Abdominal:     Palpations: Abdomen is soft.  Musculoskeletal:        General: Normal range of motion.     Cervical back: Normal range of motion.  Skin:    General: Skin is warm and dry.  Neurological:     General: No focal deficit present.     Mental Status: She is alert.  Psychiatric:         Attention and Perception: She is inattentive.        Mood and Affect: Mood is depressed.        Speech: Speech normal.        Behavior: Behavior is cooperative.        Thought Content: Thought content includes suicidal ideation. Thought content does not include suicidal plan.        Cognition and Memory: Cognition normal.        Judgment: Judgment is impulsive.     Review of  Systems  Constitutional: Negative.   HENT: Negative.   Eyes: Negative.   Respiratory: Negative.   Cardiovascular: Negative.   Gastrointestinal: Negative.   Musculoskeletal: Negative.   Skin: Negative.   Neurological: Negative.   Psychiatric/Behavioral: Positive for dysphoric mood, sleep disturbance and suicidal ideas.    Blood pressure 131/80, pulse (!) 114, temperature 98.8 F (37.1 C), temperature source Oral, resp. rate 20, height 5\' 7"  (1.702 m), weight 56.7 kg, last menstrual period 11/18/2019, SpO2 100 %, currently breastfeeding.Body mass index is 19.58 kg/m.  General Appearance: Casual  Eye Contact:  Good  Speech:  Slow  Volume:  Decreased  Mood:  Depressed  Affect:  Constricted  Thought Process:  Coherent  Orientation:  Full (Time, Place, and Person)  Thought Content:  Logical  Suicidal Thoughts:  Yes.  without intent/plan  Homicidal Thoughts:  No  Memory:  Immediate;   Fair Recent;   Poor Remote;   Poor  Judgement:  Impaired  Insight:  Shallow  Psychomotor Activity:  Decreased  Concentration:  Concentration: Fair  Recall:  13/05/2019 of Knowledge:  Fair  Language:  Fair  Akathisia:  No  Handed:  Right  AIMS (if indicated):     Assets:  Desire for Improvement Physical Health Resilience  ADL's:  Impaired  Cognition:  Impaired,  Mild  Sleep:        Treatment Plan Summary: Daily contact with patient to assess and evaluate symptoms and progress in treatment, Medication management and Plan  Patient was offered the option of considering discharge from the emergency room and outpatient  follow-up but says that she feels unsafe with passive suicidal ideation and severe depression.  Says that she has never felt this depressed in her life.  She feels it would be safer to be admitted to the inpatient hospital.  Bed not likely to be available until tomorrow.  Case reviewed with TTS and emergency room physician.  Continue outpatient medicine including her Suboxone which she has not had for a couple of days.  Plan for likely admission tomorrow morning.  Disposition: Recommend psychiatric Inpatient admission when medically cleared. Supportive therapy provided about ongoing stressors.  [employer], MD 11/23/2019 4:21 PM

## 2019-11-23 NOTE — BH Assessment (Signed)
Patient is to be admitted to [facility] by Dr. Toni Amend.  Attending Physician will be Dr. Neale Burly.   Patient has been assigned to room 310, by [facility] Charge Nurse [place].   Intake Paper Work has been signed and placed on patient chart.  ER staff is aware of the admission:  Luanne, ER Secretary    Dr. Erma Heritage, ER MD   Lacinda Axon, Patient's Nurse   Ethelene Browns, Patient Access.

## 2019-11-23 NOTE — ED Notes (Signed)
Pt's keys removed from pt belongings bag per patient by this RN and [facility] [place], NT. Placed at front desk with pt label for boyfriend to come pick them up.

## 2019-11-23 NOTE — ED Notes (Signed)
Hourly rounding reveals patient in room. No complaints, stable, in no acute distress. Q15 minute rounds and monitoring via Security Cameras to continue. 

## 2019-11-23 NOTE — ED Notes (Signed)
Snack and beverage given. 

## 2019-11-23 NOTE — ED Notes (Signed)
TTS at bedside. 

## 2019-11-23 NOTE — ED Triage Notes (Signed)
Pt comes into the ED via POV c/o increased depression.  Pt states she was brought in 2 weeks ago under IVC and dx with bipolar disorder.  Pt states that she has started new medication and feels like medications need to be adjusted.  Pt would like to be voluntarily committed so she can work on getting medications correct to improve her depression.  Pt tearful in triage at this time. Pt denies any SI or HI.

## 2019-11-23 NOTE — ED Notes (Signed)
Pt reports being started on new medications on her last stay here and isn't sure if they're doing what they should. States the meds make her feel like a zombie and she can't be fully present mentally for herself or her family. Pt denies concrete thoughts of SI but states "just the usual". Endorses good reasons to live such as her daughter and her family.

## 2019-11-23 NOTE — ED Notes (Signed)
VOL/  MOVED  TO  [facility] SEEN  BY  DR  [facility]  MD  PENDING  PLACEMENT

## 2019-11-23 NOTE — ED Notes (Signed)
Report to include Situation, Background, Assessment, and Recommendations received from Jeannette RN. Patient alert and oriented, warm and dry, in no acute distress. Patient denies SI, HI, AVH and pain. Patient made aware of Q15 minute rounds and security cameras for their safety. Patient instructed to come to me with needs or concerns.  

## 2019-11-23 NOTE — ED Notes (Addendum)
Pt asked if staff could get her debit card out of her purse and give it to her boyfriend. A [employer] debit card was taken out of pt wallet by this tech and given to boyfriend.

## 2019-11-24 ENCOUNTER — Other Ambulatory Visit: Payer: Self-pay

## 2019-11-24 ENCOUNTER — Encounter: Payer: Self-pay | Admitting: Psychiatry

## 2019-11-24 DIAGNOSIS — F319 Bipolar disorder, unspecified: Secondary | ICD-10-CM

## 2019-11-24 MED ORDER — ENSURE ENLIVE PO LIQD
237.0000 mL | Freq: Three times a day (TID) | ORAL | Status: DC
  Administered 2019-11-24 – 2019-12-01 (×19): 237 mL via ORAL

## 2019-11-24 MED ORDER — LURASIDONE HCL 40 MG PO TABS
40.0000 mg | ORAL_TABLET | Freq: Every day | ORAL | Status: DC
  Administered 2019-11-24 – 2019-12-01 (×8): 40 mg via ORAL
  Filled 2019-11-24 (×8): qty 1

## 2019-11-24 NOTE — H&P (Signed)
Psychiatric Admission Assessment Adult  Patient Identification: Marcia West MRN:  [id number] Date of Evaluation:  11/24/2019 Chief Complaint:  Bipolar depression (HCC) [F31.9] Principal Diagnosis: Bipolar depression (HCC) Diagnosis:  Principal Problem:   Bipolar depression (HCC) Active Problems:   Opioid use disorder   Insomnia secondary to anxiety   Alcohol abuse   Cocaine abuse (HCC)  History of Present Illness: 34 year old female with bipolar I disorder presented to emergency room voluntarily for worsening depression and suicidal ideation without plan. Marcia West was seen one-on-one today. She reports that after leaving the hospital she was able to follow-up with [facility] for suboxone, and saw a therapist at [facility]. She notes that she initially felt better when she left the hospital, but noted she began to feel "like a zombie" while on lithium. She noted she was unable to think straight, or feel any emotions. She stopped this abruptly two days ago along with suboxone. She relapsed on cocaine and alcohol and noted Marcia West mood worsened even further and she began having incessant thoughts of suicide. She also endorsed poor sleep, low energy, anhedonia, poor appetite, weight loss, and hopelessness. She notes many days she wouldn't get out of bed to shower or eat. She sites Marcia West daughter as the reason she did not formulate a suicide plan or act on those thoughts. She feels Marcia West Abilify is not working well enough, and feels she needs to make a medication change. She had previously been on max dose of Abilify without improvement when Lithium was added as adjunct. She has also previously tried Seroquel which was too sedating. Many SSRIs have been tried including Prozac, and she has also been on Wellbutrin all of which have induced mania. Discussed starting Latuda for bipolar depression as this has not been tried in the past. RBA discussed, and patient in agreement.   Associated Signs/Symptoms: Depression  Symptoms:  depressed mood, anhedonia, insomnia, psychomotor retardation, fatigue, feelings of worthlessness/guilt, difficulty concentrating, hopelessness, recurrent thoughts of death, suicidal thoughts without plan, anxiety, loss of energy/fatigue, weight loss, decreased appetite, Duration of Depression Symptoms: No data recorded (Hypo) Manic Symptoms:  Impulsivity, Labiality of Mood, Anxiety Symptoms:  Excessive Worry, Panic Symptoms, Psychotic Symptoms:  Paranoia Duration of Psychotic Symptoms: No data recorded PTSD Symptoms: Negative Total Time spent with patient: 45 minutes  Past Psychiatric History: Three previous hospital admission, most recently in October 2021. History of court ordered medications during pregnancy. 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 She states she has been on numerous medications in the past, but can only recall a few. Specifically, she has been on Wellbutrin which induced hyperactivity and mania, Prozac which was not helpful, Seroquel which made Marcia West feel tired, Buspar ineffective at max dose, Abilify ineffective for mood stabilization and depression in the past. Lithium caused Marcia West to "feel like a zombie"  Is the patient at risk to self? Yes.    Has the patient been a risk to self in the past 6 months? Yes.    Has the patient been a risk to self within the distant past? Yes.    Is the patient a risk to others? No.  Has the patient been a risk to others in the past 6 months? No.  Has the patient been a risk to others within the distant past? No.   Prior Inpatient Therapy:   Prior Outpatient Therapy:    Alcohol Screening: 1. How often do you have a drink containing alcohol?: 2 to 3 times a week 2. How many drinks containing  alcohol do you have on a typical day when you are drinking?: 5 or 6 3. How often do you have six or more drinks on one occasion?: Daily or almost daily AUDIT-C Score: 9 4. How often during the last year have  you found that you were not able to stop drinking once you had started?: Weekly 5. How often during the last year have you failed to do what was normally expected from you because of drinking?: Weekly 6. How often during the last year have you needed a first drink in the morning to get yourself going after a heavy drinking session?: Monthly 7. How often during the last year have you had a feeling of guilt of remorse after drinking?: Weekly 8. How often during the last year have you been unable to remember what happened the night before because you had been drinking?: Weekly 9. Have you or someone else been injured as a result of your drinking?: No 10. Has a relative or friend or a doctor or another health worker been concerned about your drinking or suggested you cut down?: Yes, but not in the last year Alcohol Use Disorder Identification Test Final Score (AUDIT): 25 Alcohol Brief Interventions/Follow-up: Medication Offered/Prescribed, Alcohol Education Substance Abuse History in the last 12 months:  Yes.   Consequences of Substance Abuse: Withdrawal Symptoms:   Headaches Nausea Previous Psychotropic Medications: Yes  Psychological Evaluations: Yes  Past Medical History:  Past Medical History:  Diagnosis Date  . Anxiety   . Bipolar affective (HCC)   . Depression   . Herpes simplex type II infection   . Substance abuse [facility])     Past Surgical History:  Procedure Laterality Date  . NO PAST SURGERIES    . WISDOM TOOTH EXTRACTION     Family History:  Family History  Problem Relation Age of Onset  . Arthritis Maternal Grandmother   . Diabetes Maternal Grandmother   . Diabetes Maternal Grandfather   . Obesity Paternal Grandmother    Family Psychiatric  History: Mother and father with depression Tobacco Screening: Have you used any form of tobacco in the last 30 days? (Cigarettes, Smokeless Tobacco, Cigars, and/or Pipes): Yes Tobacco use, Select all that apply: 5 or more cigarettes  per day Are you interested in Tobacco Cessation Medications?: Yes, will notify MD for an order Counseled patient on smoking cessation including recognizing danger situations, developing coping skills and basic information about quitting provided: Yes Social History:  Social History   Substance and Sexual Activity  Alcohol Use Not Currently     Social History   Substance and Sexual Activity  Drug Use Not Currently  . Types: Methamphetamines, Other-see comments, Marijuana, "Crack" cocaine   Comment: suboxone daily    Additional Social History:                           Allergies:  No Known Allergies Lab Results:  Results for orders placed or performed during the hospital encounter of 11/23/19 (from the past 48 hour(s))  Comprehensive metabolic panel     Status: Abnormal   Collection Time: 11/23/19  2:24 PM  Result Value Ref Range   Sodium 139 135 - 145 mmol/L   Potassium 3.5 3.5 - 5.1 mmol/L   Chloride 104 98 - 111 mmol/L   CO2 26 22 - 32 mmol/L   Glucose, Bld 119 (H) 70 - 99 mg/dL    Comment: Glucose reference range applies only to samples taken  after fasting for at least 8 hours.   BUN 14 6 - 20 mg/dL   Creatinine, Ser 7.82 0.44 - 1.00 mg/dL   Calcium 9.3 8.9 - 95.6 mg/dL   Total Protein 8.1 6.5 - 8.1 g/dL   Albumin 4.8 3.5 - 5.0 g/dL   AST 17 15 - 41 U/L   ALT 13 0 - 44 U/L   Alkaline Phosphatase 39 38 - 126 U/L   Total Bilirubin 0.8 0.3 - 1.2 mg/dL   GFR, Estimated >21 >30 mL/min    Comment: (NOTE) Calculated using the CKD-EPI Creatinine Equation (2021)    Anion gap 9 5 - 15    Comment: Performed at [facility], [address] [place], [place] [address]  Ethanol     Status: Abnormal   Collection Time: 11/23/19  2:24 PM  Result Value Ref Range   Alcohol, Ethyl (B) 19 (H) <10 mg/dL    Comment: (NOTE) Lowest detectable limit for serum alcohol is 10 mg/dL.  For medical purposes only. Performed at [facility], [address]  [place], [place] [address]   Salicylate level     Status: Abnormal   Collection Time: 11/23/19  2:24 PM  Result Value Ref Range   Salicylate Lvl <7.0 (L) 7.0 - 30.0 mg/dL    Comment: Performed at [facility], [address] [place], [place] [address]  Acetaminophen level     Status: Abnormal   Collection Time: 11/23/19  2:24 PM  Result Value Ref Range   Acetaminophen (Tylenol), Serum <10 (L) 10 - 30 ug/mL    Comment: (NOTE) Therapeutic concentrations vary significantly. A range of 10-30 ug/mL  may be an effective concentration for many patients. However, some  are best treated at concentrations outside of this range. Acetaminophen concentrations >150 ug/mL at 4 hours after ingestion  and >50 ug/mL at 12 hours after ingestion are often associated with  toxic reactions.  Performed at [facility], [address] [place], [place] [address]   cbc     Status: None   Collection Time: 11/23/19  2:24 PM  Result Value Ref Range   WBC 10.4 4.0 - 10.5 K/uL   RBC 4.35 3.87 - 5.11 MIL/uL   Hemoglobin 13.9 12.0 - 15.0 g/dL   HCT 01.0 36 - 46 %   MCV 94.9 80.0 - 100.0 fL   MCH 32.0 26.0 - 34.0 pg   MCHC 33.7 30.0 - 36.0 g/dL   RDW 27.2 53.6 - 64.4 %   Platelets 259 150 - 400 K/uL   nRBC 0.0 0.0 - 0.2 %    Comment: Performed at [facility], [address], [place], [place] [address]  Urine Drug Screen, Qualitative     Status: Abnormal   Collection Time: 11/23/19  2:24 PM  Result Value Ref Range   Tricyclic, Ur Screen NONE DETECTED NONE DETECTED   Amphetamines, Ur Screen NONE DETECTED NONE DETECTED   MDMA (Ecstasy)Ur Screen NONE DETECTED NONE DETECTED   Cocaine Metabolite,Ur [place] POSITIVE (A) NONE DETECTED   Opiate, Ur Screen NONE DETECTED NONE DETECTED   Phencyclidine (PCP) Ur S NONE DETECTED NONE DETECTED   Cannabinoid 50 Ng, Ur  POSITIVE (A) NONE DETECTED   Barbiturates, Ur Screen NONE DETECTED NONE DETECTED   Benzodiazepine, Ur Scrn POSITIVE (A) NONE  DETECTED   Methadone Scn, Ur NONE DETECTED NONE DETECTED    Comment: (NOTE) Tricyclics + metabolites, urine    Cutoff 1000 ng/mL Amphetamines + metabolites, urine  Cutoff 1000 ng/mL MDMA (Ecstasy), urine              Cutoff 500 ng/mL Cocaine Metabolite, urine          Cutoff 300 ng/mL Opiate + metabolites, urine        Cutoff 300 ng/mL Phencyclidine (PCP), urine         Cutoff 25 ng/mL Cannabinoid, urine                 Cutoff 50 ng/mL Barbiturates + metabolites, urine  Cutoff 200 ng/mL Benzodiazepine, urine              Cutoff 200 ng/mL Methadone, urine                   Cutoff 300 ng/mL  The urine drug screen provides only a preliminary, unconfirmed analytical test result and should not be used for non-medical purposes. Clinical consideration and professional judgment should be applied to any positive drug screen result due to possible interfering substances. A more specific alternate chemical method must be used in order to obtain a confirmed analytical result. Gas chromatography / mass spectrometry (GC/MS) is the preferred confirm atory method. Performed at [facility], [address] [place], [place] [address]   Lithium level     Status: Abnormal   Collection Time: 11/23/19  2:24 PM  Result Value Ref Range   Lithium Lvl 0.13 (L) 0.60 - 1.20 mmol/L    Comment: Performed at [facility], [address] [place], [place] [address]  TSH     Status: None   Collection Time: 11/23/19  2:24 PM  Result Value Ref Range   TSH 1.047 0.350 - 4.500 uIU/mL    Comment: Performed by a 3rd Generation assay with a functional sensitivity of <=0.01 uIU/mL. Performed at [facility], [address] [place], [place] [address]   POC urine preg, ED     Status: None   Collection Time: 11/23/19  2:42 PM  Result Value Ref Range   Preg Test, Ur Negative Negative  Respiratory Panel by RT PCR (Flu A&B, Covid) - Nasopharyngeal Swab     Status: None   Collection Time:  11/23/19  4:23 PM   Specimen: Nasopharyngeal Swab  Result Value Ref Range   SARS Coronavirus 2 by RT PCR NEGATIVE NEGATIVE    Comment: (NOTE) SARS-CoV-2 target nucleic acids are NOT DETECTED.  The SARS-CoV-2 RNA is generally detectable in upper respiratoy specimens during the acute phase of infection. The lowest concentration of SARS-CoV-2 viral copies this assay can detect is 131 copies/mL. A negative result does not preclude SARS-Cov-2 infection and should not be used as the sole basis for treatment or other patient management decisions. A negative result may occur with  improper specimen collection/handling, submission of specimen other than nasopharyngeal swab, presence of viral mutation(s) within the areas targeted by this assay, and inadequate number of viral copies (<131 copies/mL). A negative result must be combined with clinical observations, patient history, and epidemiological information. The expected result is Negative.  Fact Sheet for Patients:  https://www.moore.com/  Fact Sheet for Healthcare Providers:  https://www.young.biz/  This test is no t yet approved or cleared by the [place] FDA and  has been authorized for detection and/or diagnosis of SARS-CoV-2 by FDA under an Emergency Use Authorization (EUA). This EUA will remain  in effect (meaning this test can be used) for the duration of the COVID-19 declaration under Section 564(b)(1) of the Act, 21  U.S.C. section 360bbb-3(b)(1), unless the authorization is terminated or revoked sooner.     Influenza A by PCR NEGATIVE NEGATIVE   Influenza B by PCR NEGATIVE NEGATIVE    Comment: (NOTE) The Xpert Xpress SARS-CoV-2/FLU/RSV assay is intended as an aid in  the diagnosis of influenza from Nasopharyngeal swab specimens and  should not be used as a sole basis for treatment. Nasal washings and  aspirates are unacceptable for Xpert Xpress SARS-CoV-2/FLU/RSV  testing.  Fact  Sheet for Patients: https://www.moore.com/  Fact Sheet for Healthcare Providers: https://www.young.biz/  This test is not yet approved or cleared by the [place] FDA and  has been authorized for detection and/or diagnosis of SARS-CoV-2 by  FDA under an Emergency Use Authorization (EUA). This EUA will remain  in effect (meaning this test can be used) for the duration of the  Covid-19 declaration under Section 564(b)(1) of the Act, 21  U.S.C. section 360bbb-3(b)(1), unless the authorization is  terminated or revoked. Performed at [facility], [address] [place], [place] [address]     Blood Alcohol level:  Lab Results  Component Value Date   ETH 19 (H) 11/23/2019   ETH <10 10/14/2019    Metabolic Disorder Labs:  No results found for: HGBA1C, MPG No results found for: PROLACTIN No results found for: CHOL, TRIG, HDL, CHOLHDL, VLDL, LDLCALC  Current Medications: Current Facility-Administered Medications  Medication Dose Route Frequency Provider Last Rate Last Admin  . acetaminophen (TYLENOL) tablet 650 mg  650 mg Oral Q4H PRN Clapacs, John T, MD      . alum & mag hydroxide-simeth (MAALOX/MYLANTA) 200-200-20 MG/5ML suspension 30 mL  30 mL Oral Q6H PRN Clapacs, John T, MD      . buprenorphine-naloxone (SUBOXONE) 2-0.5 mg per SL tablet 1 tablet  1 tablet Sublingual Daily Clapacs, Jackquline Denmark, MD   1 tablet at 11/24/19 0920  . clonazePAM (KLONOPIN) tablet 1 mg  1 mg Oral BID Clapacs, Jackquline Denmark, MD   1 mg at 11/24/19 0920  . feeding supplement (ENSURE ENLIVE / ENSURE PLUS) liquid 237 mL  237 mL Oral TID BM Jesse Sans, MD      . lurasidone (LATUDA) tablet 40 mg  40 mg Oral Q supper Jesse Sans, MD      . magnesium hydroxide (MILK OF MAGNESIA) suspension 30 mL  30 mL Oral Daily PRN Clapacs, John T, MD      . nicotine (NICODERM CQ - dosed in mg/24 hours) patch 21 mg  21 mg Transdermal Daily Clapacs, Jackquline Denmark, MD   21 mg at 11/24/19  0920  . ondansetron (ZOFRAN) tablet 4 mg  4 mg Oral Q8H PRN Clapacs, John T, MD      . traZODone (DESYREL) tablet 150 mg  150 mg Oral QHS PRN Gillermo Murdoch, NP   150 mg at 11/23/19 2319   PTA Medications: Medications Prior to Admission  Medication Sig Dispense Refill Last Dose  . ARIPiprazole (ABILIFY) 5 MG tablet Take 1 tablet (5 mg total) by mouth daily. 30 tablet 1   . Buprenorphine HCl-Naloxone HCl 4-1 MG FILM Take 1/2 film daily SL. 7 each 0   . clonazePAM (KLONOPIN) 1 MG tablet Take 1 tablet (1 mg total) by mouth 2 (two) times daily. 30 tablet 1   . lithium carbonate (ESKALITH) 450 MG CR tablet Take 1 tablet (450 mg total) by mouth every 12 (twelve) hours. 60 tablet 1   . nicotine (NICODERM CQ - DOSED IN MG/24 HOURS)  21 mg/24hr patch Place 1 patch (21 mg total) onto the skin daily. 28 patch 0   . traZODone (DESYREL) 150 MG tablet Take 1 tablet (150 mg total) by mouth at bedtime as needed for sleep. 30 tablet 1     Musculoskeletal: Strength & Muscle Tone: within normal limits Gait & Station: normal Patient leans: N/A  Psychiatric Specialty Exam: Physical Exam Vitals and nursing note reviewed.  Constitutional:      Appearance: Normal appearance.  HENT:     Head: Normocephalic and atraumatic.     Right Ear: External ear normal.     Left Ear: External ear normal.     Nose: Nose normal.     Mouth/Throat:     Mouth: Mucous membranes are moist.     Pharynx: Oropharynx is clear.  Eyes:     Extraocular Movements: Extraocular movements intact.     Conjunctiva/sclera: Conjunctivae normal.     Pupils: Pupils are equal, round, and reactive to light.  Cardiovascular:     Rate and Rhythm: Normal rate.     Pulses: Normal pulses.  Pulmonary:     Effort: Pulmonary effort is normal.     Breath sounds: Normal breath sounds.  Abdominal:     General: Abdomen is flat.     Palpations: Abdomen is soft.  Musculoskeletal:        General: No swelling. Normal range of motion.      Cervical back: Normal range of motion and neck supple.  Skin:    General: Skin is warm and dry.  Neurological:     General: No focal deficit present.     Mental Status: She is alert and oriented to person, place, and time.  Psychiatric:        Attention and Perception: Attention and perception normal.        Mood and Affect: Mood is depressed.        Speech: Speech is delayed.        Behavior: Behavior is slowed.        Thought Content: Thought content includes suicidal ideation.        Cognition and Memory: Cognition and memory normal.        Judgment: Judgment is impulsive.     Review of Systems  Constitutional: Positive for activity change, appetite change and fatigue.  HENT: Negative for rhinorrhea and sore throat.   Eyes: Negative for photophobia and visual disturbance.  Respiratory: Negative for cough and shortness of breath.   Cardiovascular: Negative for chest pain and palpitations.  Gastrointestinal: Negative for constipation, diarrhea, nausea and vomiting.  Endocrine: Negative for cold intolerance and heat intolerance.  Genitourinary: Negative for difficulty urinating and dysuria.  Musculoskeletal: Negative for arthralgias and myalgias.  Skin: Negative for rash and wound.  Allergic/Immunologic: Negative for environmental allergies and food allergies.  Neurological: Negative for dizziness and headaches.  Hematological: Negative for adenopathy. Does not bruise/bleed easily.  Psychiatric/Behavioral: Positive for confusion, decreased concentration, dysphoric mood, sleep disturbance and suicidal ideas.    Blood pressure 109/68, pulse 75, temperature 97.7 F (36.5 C), temperature source Oral, resp. rate 17, height 5\' 7"  (1.702 m), weight 58.1 kg, last menstrual period 11/18/2019, SpO2 100 %, currently breastfeeding.Body mass index is 20.05 kg/m.  General Appearance: Fairly Groomed  Eye Contact:  Fair  Speech:  Clear and Coherent  Volume:  Normal  Mood:  Depressed   Affect:  Congruent  Thought Process:  Coherent  Orientation:  Full (Time, Place, and Person)  Thought Content:  Logical  Suicidal Thoughts:  Yes.  without intent/plan  Homicidal Thoughts:  No  Memory:  Immediate;   Good Recent;   Good Remote;   Good  Judgement:  Intact  Insight:  Fair  Psychomotor Activity:  Normal  Concentration:  Concentration: Fair and Attention Span: Fair  Recall:  [employer]Fair  Fund of Knowledge:  Fair  Language:  Fair  Akathisia:  Negative  Handed:  Right  AIMS (if indicated):     Assets:  Communication Skills Desire for Improvement Housing Physical Health Resilience Social Support  ADL's:  Intact  Cognition:  WNL  Sleep:  Number of Hours: 5.45       Treatment Plan Summary: Daily contact with patient to assess and evaluate symptoms and progress in treatment and Medication management PLAN OF CARE: Continue inpatient admission. Abilify discontinued. Will start Lurasidone 40 mg with supper for bipolar depression. Continue Klonopin 1 mg BID for anxiety, Suboxone 2-0.5 mg for opiod use disorder. Trazodone 150 mg QHS PRN for sleep. Will also add Ensure TID for nutritional support given poor appetite and weight loss.   Observation Level/Precautions:  15 minute checks  Laboratory:  Completed in ED  Psychotherapy:    Medications:    Consultations:    Discharge Concerns:    Estimated LOS:  Other:     Physician Treatment Plan for Primary Diagnosis: Bipolar depression (HCC) Long Term Goal(s): Improvement in symptoms so as ready for discharge  Short Term Goals: Ability to identify changes in lifestyle to reduce recurrence of condition will improve, Ability to verbalize feelings will improve, Ability to disclose and discuss suicidal ideas, Ability to demonstrate self-control will improve, Ability to identify and develop effective coping behaviors will improve, Compliance with prescribed medications will improve and Ability to identify triggers associated with  substance abuse/mental health issues will improve  Physician Treatment Plan for Secondary Diagnosis: Principal Problem:   Bipolar depression (HCC) Active Problems:   Opioid use disorder   Insomnia secondary to anxiety   Alcohol abuse   Cocaine abuse (HCC)  Long Term Goal(s): Improvement in symptoms so as ready for discharge  Short Term Goals: Ability to identify changes in lifestyle to reduce recurrence of condition will improve, Ability to verbalize feelings will improve, Ability to disclose and discuss suicidal ideas, Ability to demonstrate self-control will improve, Ability to identify and develop effective coping behaviors will improve, Ability to maintain clinical measurements within normal limits will improve and Ability to identify triggers associated with substance abuse/mental health issues will improve  I certify that inpatient services furnished can reasonably be expected to improve the patient's condition.    Jesse SansMegan M Zilda No, MD 11/11/20213:49 PM

## 2019-11-24 NOTE — [facility] Group Notes (Signed)
LCSW Group Therapy Note  11/24/2019 2:16 PM  Type of Therapy/Topic:  Group Therapy:  Balance in Life  Participation Level:  Active  Description of Group:    This group will address the concept of balance and how it feels and looks when one is unbalanced. Patients will be encouraged to process areas in their lives that are out of balance and identify reasons for remaining unbalanced. Facilitators will guide patients in utilizing problem-solving interventions to address and correct the stressor making their life unbalanced. Understanding and applying boundaries will be explored and addressed for obtaining and maintaining a balanced life. Patients will be encouraged to explore ways to assertively make their unbalanced needs known to significant others in their lives, using other group members and facilitator for support and feedback.  Therapeutic Goals: 1. Patient will identify two or more emotions or situations they have that consume much of in their lives. 2. Patient will identify signs/triggers that life has become out of balance:  3. Patient will identify two ways to set boundaries in order to achieve balance in their lives:  4. Patient will demonstrate ability to communicate their needs through discussion and/or role plays  Summary of Patient Progress: Patient was present  in group. Patient shared that balance is important when between home and work.  She was able to engage in discussion on other avenues balance is important.  Patient was supportive of other group members.  Patient was able to share that grounding techniques would be helpful.  Therapeutic Modalities:   Cognitive Behavioral Therapy Solution-Focused Therapy Assertiveness Training  [name] MSW, LCSW 11/24/2019 2:16 PM

## 2019-11-24 NOTE — B[facility] Counselor (Signed)
CSW gave pt information on [facility] ([facility]) and [facility]. Pt was informed to review the information and then let the CSW know so that the referrals could be completed. CSW will continue to follow regarding disposition plans.   Marcia West. Algis Greenhouse, MSW, LCSW, LCAS 11/24/2019 3:01 PM

## 2019-11-24 NOTE — Progress Notes (Signed)
BRIEF PHARMACY NOTE   This patient attended and participated in Medication Management Group counseling led by [facility] staff pharmacist.  This interactive class reviews basic information about prescription medications and education on personal responsibility in medication management.  The class also includes general knowledge of 3 main classes of behavioral medications, including antipsychotics, antidepressants, and mood stabilizers.     Patient behavior was appropriate for group setting.   Educational materials sourced from:  "Medication Do's and Don'ts" from [email or web address]   "Mental Health Medications" from [facility] of Mental Health FaxRack.[email or web address].shtml#[email or web address]     Albina Billet, PharmD, BCPS Clinical Pharmacist 11/24/2019 3:06 PM

## 2019-11-24 NOTE — Progress Notes (Signed)
Recreation Therapy Notes   Date: 11/24/2019  Time: 9:30 am   Location: Craft room     Behavioral response: N/A   Intervention Topic: Communication   Discussion/Intervention: Patient did not attend group.   Clinical Observations/Feedback:  Patient did not attend group.   Edee Nifong LRT/CTRS         Marcia West 11/24/2019 11:03 AM

## 2019-11-24 NOTE — B[facility] Suicide Risk Assessment (Signed)
[facility] INPATIENT:  Family/Significant Other Suicide Prevention Education  Suicide Prevention Education:  Patient Refusal for Family/Significant Other Suicide Prevention Education: The patient Marcia West has refused to provide written consent for family/significant other to be provided Family/Significant Other Suicide Prevention Education during admission and/or prior to discharge.  Physician notified.  SPE completed with pt, as pt refused to consent to family contact. SPI pamphlet provided to pt and pt was encouraged to share information with support network, ask questions, and talk about any concerns relating to SPE. Pt denies access to guns/firearms and verbalized understanding of information provided. Mobile Crisis information also provided to pt.  Glenis Smoker 11/24/2019, 10:07 AM

## 2019-11-24 NOTE — Progress Notes (Signed)
Pt requests that staff assist in giving her friend Oneita Kras her debit and keys to her car. She has given this friend her code. Charge Rn as witness for this request by pt. MHT assisted pt in handing them off to her friend.

## 2019-11-24 NOTE — [facility] Suicide Risk Assessment (Signed)
[facility] Admission Suicide Risk Assessment   Nursing information obtained from:  Patient Demographic factors:  Adolescent or young adult, Low socioeconomic status Current Mental Status:  NA Loss Factors:  Loss of significant relationship Historical Factors:  Impulsivity Risk Reduction Factors:  Positive social support  Total Time spent with patient: 45 minutes Principal Problem: Bipolar depression (HCC) Diagnosis:  Principal Problem:   Bipolar depression (HCC) Active Problems:   Opioid use disorder   Insomnia secondary to anxiety   Alcohol abuse   Cocaine abuse (HCC)  Subjective Data: 34 year old female with bipolar I disorder presented to emergency room voluntarily for worsening depression and suicidal ideation without plan. Marcia West was seen one-on-one today. She reports that after leaving the hospital she was able to follow-up with [facility] for suboxone, and saw a therapist at [facility]. She notes that she initially felt better when she left the hospital, but noted she began to feel "like a zombie" while on lithium. She noted she was unable to think straight, or feel any emotions. She stopped this abruptly two days ago along with suboxone. She relapsed on cocaine and alcohol and noted her mood worsened even further and she began having incessant thoughts of suicide. She also endorsed poor sleep, low energy, anhedonia, poor appetite, weight loss, and hopelessness. She notes many days she wouldn't get out of bed to shower or eat. She sites her daughter as the reason she did not formulate a suicide plan or act on those thoughts. She feels her Abilify is not working well enough, and feels she needs to make a medication change. She had previously been on max dose of Abilify without improvement when Lithium was added as adjunct. She has also previously tried Seroquel which was too sedating. Many SSRIs have been tried including Prozac, and she has also been on Wellbutrin all of which have induced mania.  Discussed starting Latuda for bipolar depression as this has not been tried in the past. RBA discussed, and patient in agreement.   Continued Clinical Symptoms:  Alcohol Use Disorder Identification Test Final Score (AUDIT): 25 The "Alcohol Use Disorders Identification Test", Guidelines for Use in Primary Care, Second Edition.  World Science writer [facility]). Score between 0-7:  no or low risk or alcohol related problems. Score between 8-15:  moderate risk of alcohol related problems. Score between 16-19:  high risk of alcohol related problems. Score 20 or above:  warrants further diagnostic evaluation for alcohol dependence and treatment.   CLINICAL FACTORS:   Severe Anxiety and/or Agitation Bipolar Disorder:   Depressive phase Alcohol/Substance Abuse/Dependencies More than one psychiatric diagnosis Unstable or Poor Therapeutic Relationship Previous Psychiatric Diagnoses and Treatments   Musculoskeletal: Strength & Muscle Tone: within normal limits Gait & Station: normal Patient leans: N/A  Psychiatric Specialty Exam: Physical Exam Vitals and nursing note reviewed.  Constitutional:      Appearance: Normal appearance.  HENT:     Head: Normocephalic and atraumatic.     Right Ear: External ear normal.     Left Ear: External ear normal.     Nose: Nose normal.     Mouth/Throat:     Mouth: Mucous membranes are moist.     Pharynx: Oropharynx is clear.  Eyes:     Extraocular Movements: Extraocular movements intact.     Conjunctiva/sclera: Conjunctivae normal.     Pupils: Pupils are equal, round, and reactive to light.  Cardiovascular:     Rate and Rhythm: Normal rate.     Pulses: Normal pulses.  Pulmonary:  Effort: Pulmonary effort is normal.     Breath sounds: Normal breath sounds.  Abdominal:     General: Abdomen is flat.     Palpations: Abdomen is soft.  Musculoskeletal:        General: No swelling. Normal range of motion.     Cervical back: Normal range of motion  and neck supple.  Skin:    General: Skin is warm and dry.  Neurological:     General: No focal deficit present.     Mental Status: She is alert and oriented to person, place, and time.  Psychiatric:        Attention and Perception: Attention and perception normal.        Mood and Affect: Mood is depressed.        Speech: Speech is delayed.        Behavior: Behavior is slowed.        Thought Content: Thought content includes suicidal ideation.        Cognition and Memory: Cognition and memory normal.        Judgment: Judgment is impulsive.     Review of Systems  Constitutional: Positive for activity change, appetite change and fatigue.  HENT: Negative for rhinorrhea and sore throat.   Eyes: Negative for photophobia and visual disturbance.  Respiratory: Negative for cough and shortness of breath.   Cardiovascular: Negative for chest pain and palpitations.  Gastrointestinal: Negative for constipation, diarrhea, nausea and vomiting.  Endocrine: Negative for cold intolerance and heat intolerance.  Genitourinary: Negative for difficulty urinating and dysuria.  Musculoskeletal: Negative for arthralgias and myalgias.  Skin: Negative for rash and wound.  Allergic/Immunologic: Negative for environmental allergies and food allergies.  Neurological: Negative for dizziness and headaches.  Hematological: Negative for adenopathy. Does not bruise/bleed easily.  Psychiatric/Behavioral: Positive for confusion, decreased concentration, dysphoric mood, sleep disturbance and suicidal ideas.    Blood pressure 109/68, pulse 75, temperature 97.7 F (36.5 C), temperature source Oral, resp. rate 17, height 5\' 7"  (1.702 m), weight 58.1 kg, last menstrual period 11/18/2019, SpO2 100 %, currently breastfeeding.Body mass index is 20.05 kg/m.  General Appearance: Fairly Groomed  Eye Contact:  Fair  Speech:  Clear and Coherent  Volume:  Normal  Mood:  Depressed  Affect:  Congruent  Thought Process:   Coherent  Orientation:  Full (Time, Place, and Person)  Thought Content:  Logical  Suicidal Thoughts:  Yes.  without intent/plan  Homicidal Thoughts:  No  Memory:  Immediate;   Good Recent;   Good Remote;   Good  Judgement:  Intact  Insight:  Fair  Psychomotor Activity:  Normal  Concentration:  Concentration: Fair and Attention Span: Fair  Recall:  13/05/2019 of Knowledge:  Fair  Language:  Fair  Akathisia:  Negative  Handed:  Right  AIMS (if indicated):     Assets:  Communication Skills Desire for Improvement Housing Physical Health Resilience Social Support  ADL's:  Intact  Cognition:  WNL  Sleep:  Number of Hours: 5.45      COGNITIVE FEATURES THAT CONTRIBUTE TO RISK:  Polarized thinking    SUICIDE RISK:   Moderate:  Frequent suicidal ideation with limited intensity, and duration, some specificity in terms of plans, no associated intent, good self-control, limited dysphoria/symptomatology, some risk factors present, and identifiable protective factors, including available and accessible social support.  PLAN OF CARE: Continue inpatient admission. Abilify discontinued. Will start Lurasidone 40 mg with supper for bipolar depression. Continue Klonopin 1 mg BID for  anxiety, Suboxone 2-0.5 mg for opiod use disorder. Trazodone 150 mg QHS PRN for sleep. Will also add Ensure TID for nutritional support given poor appetite and weight loss.   I certify that inpatient services furnished can reasonably be expected to improve the patient's condition.   Jesse Sans, MD 11/24/2019, 3:38 PM
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
## 2019-11-24 NOTE — Progress Notes (Signed)
Pt is alert and oriented to person, place, time and situation. Pt is social with peers, out for meals, is medication compliant, denies suicidal and homicidal ideation, denies hallucinations. Pt is calm and cooperative, pleasant, mood is anxious, reports anxiety is 7/10 on a 0-10 scale, 10 being worst, reports meds for anxiety help relieve it somewhat, reports feeling depressed, but less so since time of admission, rating it 5/10 on a 0-10 scale, 10 being worst. Pt has fair insight, able to report reason for admission. Will continue to monitor pt per Q15 minute face checks and monitor for safety and progress.

## 2019-11-25 MED ORDER — HYDROXYZINE HCL 25 MG PO TABS
25.0000 mg | ORAL_TABLET | Freq: Three times a day (TID) | ORAL | Status: DC | PRN
  Administered 2019-11-25 – 2019-12-01 (×9): 25 mg via ORAL
  Filled 2019-11-25 (×10): qty 1

## 2019-11-25 NOTE — BHH Counselor (Signed)
CSW faxed referrals to [facility] and [facility].  CSW received confirmation that the fax was successful.  [name], MSW, LCSW 11/25/2019 4:47 PM

## 2019-11-25 NOTE — Tx Team (Addendum)
Interdisciplinary Treatment and Diagnostic Plan Update  11/25/2019 Time of Session: 0900 Tasnia Spegal MRN: [id number]  Principal Diagnosis: Bipolar depression ([place])  Secondary Diagnoses: Principal Problem:   Bipolar depression ([place]) Active Problems:   Opioid use disorder   Insomnia secondary to anxiety   Alcohol abuse   Cocaine abuse ([place])   Current Medications:  Current Facility-Administered Medications  Medication Dose Route Frequency Provider Last Rate Last Admin  . acetaminophen (TYLENOL) tablet 650 mg  650 mg Oral Q4H PRN Clapacs, John T, MD      . alum & mag hydroxide-simeth (MAALOX/MYLANTA) 200-200-20 MG/5ML suspension 30 mL  30 mL Oral Q6H PRN Clapacs, John T, MD      . buprenorphine-naloxone (SUBOXONE) 2-0.5 mg per SL tablet 1 tablet  1 tablet Sublingual Daily Clapacs, Madie Reno, MD   1 tablet at 11/25/19 0753  . clonazePAM (KLONOPIN) tablet 1 mg  1 mg Oral BID Clapacs, Madie Reno, MD   1 mg at 11/25/19 0752  . feeding supplement (ENSURE ENLIVE / ENSURE PLUS) liquid 237 mL  237 mL Oral TID BM Salley Scarlet, MD   237 mL at 11/25/19 1000  . lurasidone (LATUDA) tablet 40 mg  40 mg Oral Q supper Salley Scarlet, MD   40 mg at 11/24/19 1641  . magnesium hydroxide (MILK OF MAGNESIA) suspension 30 mL  30 mL Oral Daily PRN Clapacs, John T, MD      . nicotine (NICODERM CQ - dosed in mg/24 hours) patch 21 mg  21 mg Transdermal Daily Clapacs, Madie Reno, MD   21 mg at 11/25/19 0754  . ondansetron (ZOFRAN) tablet 4 mg  4 mg Oral Q8H PRN Clapacs, John T, MD      . traZODone (DESYREL) tablet 150 mg  150 mg Oral QHS PRN Caroline Sauger, NP   150 mg at 11/24/19 2104   PTA Medications: Medications Prior to Admission  Medication Sig Dispense Refill Last Dose  . ARIPiprazole (ABILIFY) 5 MG tablet Take 1 tablet (5 mg total) by mouth daily. 30 tablet 1   . Buprenorphine HCl-Naloxone HCl 4-1 MG FILM Take 1/2 film daily SL. 7 each 0   . clonazePAM (KLONOPIN) 1 MG tablet Take 1 tablet (1 mg  total) by mouth 2 (two) times daily. 30 tablet 1   . lithium carbonate (ESKALITH) 450 MG CR tablet Take 1 tablet (450 mg total) by mouth every 12 (twelve) hours. 60 tablet 1   . nicotine (NICODERM CQ - DOSED IN MG/24 HOURS) 21 mg/24hr patch Place 1 patch (21 mg total) onto the skin daily. 28 patch 0   . traZODone (DESYREL) 150 MG tablet Take 1 tablet (150 mg total) by mouth at bedtime as needed for sleep. 30 tablet 1     Patient Stressors:    Patient Strengths:    Treatment Modalities: Medication Management, Group therapy, Case management,  1 to 1 session with clinician, Psychoeducation, Recreational therapy.   Physician Treatment Plan for Primary Diagnosis: Bipolar depression ([place]) Long Term Goal(s): Improvement in symptoms so as ready for discharge Improvement in symptoms so as ready for discharge   Short Term Goals: Ability to identify changes in lifestyle to reduce recurrence of condition will improve Ability to verbalize feelings will improve Ability to disclose and discuss suicidal ideas Ability to demonstrate self-control will improve Ability to identify and develop effective coping behaviors will improve Compliance with prescribed medications will improve Ability to identify triggers associated with substance abuse/mental health issues will improve Ability to  identify changes in lifestyle to reduce recurrence of condition will improve Ability to verbalize feelings will improve Ability to disclose and discuss suicidal ideas Ability to demonstrate self-control will improve Ability to identify and develop effective coping behaviors will improve Ability to maintain clinical measurements within normal limits will improve Ability to identify triggers associated with substance abuse/mental health issues will improve  Medication Management: Evaluate patient's response, side effects, and tolerance of medication regimen.  Therapeutic Interventions: 1 to 1 sessions, Unit Group  sessions and Medication administration.  Evaluation of Outcomes: Not Met  Physician Treatment Plan for Secondary Diagnosis: Principal Problem:   Bipolar depression ([place]) Active Problems:   Opioid use disorder   Insomnia secondary to anxiety   Alcohol abuse   Cocaine abuse ([place])  Long Term Goal(s): Improvement in symptoms so as ready for discharge Improvement in symptoms so as ready for discharge   Short Term Goals: Ability to identify changes in lifestyle to reduce recurrence of condition will improve Ability to verbalize feelings will improve Ability to disclose and discuss suicidal ideas Ability to demonstrate self-control will improve Ability to identify and develop effective coping behaviors will improve Compliance with prescribed medications will improve Ability to identify triggers associated with substance abuse/mental health issues will improve Ability to identify changes in lifestyle to reduce recurrence of condition will improve Ability to verbalize feelings will improve Ability to disclose and discuss suicidal ideas Ability to demonstrate self-control will improve Ability to identify and develop effective coping behaviors will improve Ability to maintain clinical measurements within normal limits will improve Ability to identify triggers associated with substance abuse/mental health issues will improve     Medication Management: Evaluate patient's response, side effects, and tolerance of medication regimen.  Therapeutic Interventions: 1 to 1 sessions, Unit Group sessions and Medication administration.  Evaluation of Outcomes: Progressing   RN Treatment Plan for Primary Diagnosis: Bipolar depression ([place]) Long Term Goal(s): Knowledge of disease and therapeutic regimen to maintain health will improve  Short Term Goals: Ability to participate in decision making will improve, Ability to verbalize feelings will improve, Ability to disclose and discuss suicidal ideas,  Ability to identify and develop effective coping behaviors will improve and Compliance with prescribed medications will improve  Medication Management: RN will administer medications as ordered by provider, will assess and evaluate patient's response and provide education to patient for prescribed medication. RN will report any adverse and/or side effects to prescribing provider.  Therapeutic Interventions: 1 on 1 counseling sessions, Psychoeducation, Medication administration, Evaluate responses to treatment, Monitor vital signs and CBGs as ordered, Perform/monitor CIWA, COWS, AIMS and Fall Risk screenings as ordered, Perform wound care treatments as ordered.  Evaluation of Outcomes: Progressing   LCSW Treatment Plan for Primary Diagnosis: Bipolar depression ([place]) Long Term Goal(s): Safe transition to appropriate next level of care at discharge, Engage patient in therapeutic group addressing interpersonal concerns.  Short Term Goals: Engage patient in aftercare planning with referrals and resources, Increase social support, Increase ability to appropriately verbalize feelings, Increase emotional regulation, Identify triggers associated with mental health/substance abuse issues and Increase skills for wellness and recovery  Therapeutic Interventions: Assess for all discharge needs, 1 to 1 time with Social worker, Explore available resources and support systems, Assess for adequacy in community support network, Educate family and significant other(s) on suicide prevention, Complete Psychosocial Assessment, Interpersonal group therapy.  Evaluation of Outcomes: Progressing   Progress in Treatment: Attending groups: Yes. Participating in groups: Yes. Taking medication as prescribed: Yes. Toleration medication: Yes.  Family/Significant other contact made: No, will contact:  Patient declined collateral contact. Patient understands diagnosis: Yes. Discussing patient identified problems/goals with  staff: Yes. Medical problems stabilized or resolved: Yes. Denies suicidal/homicidal ideation: Yes. Issues/concerns per patient self-inventory: No. Other: None.  New problem(s) identified: No, Describe:  None reported.   New Short Term/Long Term Goal(s):  Patient Goals:    Discharge Plan or Barriers:   Reason for Continuation of Hospitalization: Depression Medication stabilization  Estimated Length of Stay:  Recreational Therapy: Patient Stressors: N/A Patient Goal: Patient will engage in groups without prompting or encouragement from LRT x3 group sessions within 5 recreation therapy group sessions.  Attendees: Patient: Raghad Lorenz 11/25/2019 12:58 PM  Physician: Selina Cooley, MD 11/25/2019 12:58 PM  Nursing: Collier Bullock, RN  11/25/2019 12:58 PM  RN Care Manager: 11/25/2019 12:58 PM  Social Worker: Georgeann Oppenheim, MSW, [place], [place]  11/25/2019 12:58 PM  Recreational Therapist: Devin Going, LRT  11/25/2019 12:58 PM  Other: Assunta Curtis, MSW, LCSW 11/25/2019 12:58 PM  Other:  11/25/2019 12:58 PM  Other: 11/25/2019 12:58 PM    Scribe for Treatment Team: Durenda Hurt, [place] 11/25/2019 12:58 PM

## 2019-11-25 NOTE — Progress Notes (Signed)
[facility] MD Progress Note  11/25/2019 1:26 PM Marcia West  MRN:  [id number]   Subjective: 34 year old female with bipolar I disorder presented to emergency room voluntarily for worsening depression and suicidal ideation without plan. Ms. Quillin seen during treatment team and again one-on-one at bedside. She reports having horrible depression and anxiety today. She continues to endorse thoughts of suicide, but notes she could never act on this because of her daughter. She took Latuda overnight, and denies any side effects to medication. She has been participating in groups, and interacting with peers appropriately. She denies homicidal ideations, visual hallucinations, and auditory hallucinations at this time.   Principal Problem: Bipolar depression (HCC) Diagnosis: Principal Problem:   Bipolar depression (HCC) Active Problems:   Opioid use disorder   Insomnia secondary to anxiety   Alcohol abuse   Cocaine abuse (HCC)  Total Time spent with patient: 30 minutes  Past Psychiatric History: Three previous hospital admission, most recently in October 2021. History of court ordered medications during pregnancy. 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 She states she has been on numerous medications in the past, but can only recall a few. Specifically, she has been on Wellbutrin which induced hyperactivity and mania, Prozac which was not helpful, Seroquel which made her feel tired, Buspar ineffective at max dose, Abilify ineffective for mood stabilization and depression in the past. Lithium caused her to "feel like a zombie"  Past Medical History:  Past Medical History:  Diagnosis Date  . Anxiety   . Bipolar affective (HCC)   . Depression   . Herpes simplex type II infection   . Substance abuse [facility])     Past Surgical History:  Procedure Laterality Date  . NO PAST SURGERIES    . WISDOM TOOTH EXTRACTION     Family History:  Family History  Problem Relation Age of Onset   . Arthritis Maternal Grandmother   . Diabetes Maternal Grandmother   . Diabetes Maternal Grandfather   . Obesity Paternal Grandmother    Family Psychiatric  History: Mother and father with depression Social History:  Social History   Substance and Sexual Activity  Alcohol Use Not Currently     Social History   Substance and Sexual Activity  Drug Use Not Currently  . Types: Methamphetamines, Other-see comments, Marijuana, "Crack" cocaine   Comment: suboxone daily    Social History   Socioeconomic History  . Marital status: Single    Spouse name: Not on file  . Number of children: Not on file  . Years of education: Not on file  . Highest education level: Not on file  Occupational History  . Not on file  Tobacco Use  . Smoking status: Current Every Day Smoker    Packs/day: 0.25    Types: Cigarettes  . Smokeless tobacco: Never Used  . Tobacco comment: 4 cigs per day  Vaping Use  . Vaping Use: Never used  Substance and Sexual Activity  . Alcohol use: Not Currently  . Drug use: Not Currently    Types: Methamphetamines, Other-see comments, Marijuana, "Crack" cocaine    Comment: suboxone daily  . Sexual activity: Not Currently    Birth control/protection: None  Other Topics Concern  . Not on file  Social History Narrative  . Not on file   Social Determinants of Health   Financial Resource Strain:   . Difficulty of Paying Living Expenses: Not on file  Food Insecurity:   . Worried About Programme researcher, broadcasting/film/video in the Last  Year: Not on file  . Ran Out of Food in the Last Year: Not on file  Transportation Needs:   . Lack of Transportation (Medical): Not on file  . Lack of Transportation (Non-Medical): Not on file  Physical Activity:   . Days of Exercise per Week: Not on file  . Minutes of Exercise per Session: Not on file  Stress:   . Feeling of Stress : Not on file  Social Connections:   . Frequency of Communication with Friends and Family: Not on file  .  Frequency of Social Gatherings with Friends and Family: Not on file  . Attends Religious Services: Not on file  . Active Member of Clubs or Organizations: Not on file  . Attends Banker Meetings: Not on file  . Marital Status: Not on file   Additional Social History:                         Sleep: Fair  Appetite:  Poor  Current Medications: Current Facility-Administered Medications  Medication Dose Route Frequency Provider Last Rate Last Admin  . acetaminophen (TYLENOL) tablet 650 mg  650 mg Oral Q4H PRN Clapacs, John T, MD      . alum & mag hydroxide-simeth (MAALOX/MYLANTA) 200-200-20 MG/5ML suspension 30 mL  30 mL Oral Q6H PRN Clapacs, John T, MD      . buprenorphine-naloxone (SUBOXONE) 2-0.5 mg per SL tablet 1 tablet  1 tablet Sublingual Daily Clapacs, Jackquline Denmark, MD   1 tablet at 11/25/19 0753  . clonazePAM (KLONOPIN) tablet 1 mg  1 mg Oral BID Clapacs, Jackquline Denmark, MD   1 mg at 11/25/19 0752  . feeding supplement (ENSURE ENLIVE / ENSURE PLUS) liquid 237 mL  237 mL Oral TID BM Jesse Sans, MD   237 mL at 11/25/19 1000  . lurasidone (LATUDA) tablet 40 mg  40 mg Oral Q supper Jesse Sans, MD   40 mg at 11/24/19 1641  . magnesium hydroxide (MILK OF MAGNESIA) suspension 30 mL  30 mL Oral Daily PRN Clapacs, John T, MD      . nicotine (NICODERM CQ - dosed in mg/24 hours) patch 21 mg  21 mg Transdermal Daily Clapacs, Jackquline Denmark, MD   21 mg at 11/25/19 0754  . ondansetron (ZOFRAN) tablet 4 mg  4 mg Oral Q8H PRN Clapacs, Jackquline Denmark, MD      . traZODone (DESYREL) tablet 150 mg  150 mg Oral QHS PRN Gillermo Murdoch, NP   150 mg at 11/24/19 2104    Lab Results:  Results for orders placed or performed during the hospital encounter of 11/23/19 (from the past 48 hour(s))  Comprehensive metabolic panel     Status: Abnormal   Collection Time: 11/23/19  2:24 PM  Result Value Ref Range   Sodium 139 135 - 145 mmol/L   Potassium 3.5 3.5 - 5.1 mmol/L   Chloride 104 98 - 111  mmol/L   CO2 26 22 - 32 mmol/L   Glucose, Bld 119 (H) 70 - 99 mg/dL    Comment: Glucose reference range applies only to samples taken after fasting for at least 8 hours.   BUN 14 6 - 20 mg/dL   Creatinine, Ser 1.74 0.44 - 1.00 mg/dL   Calcium 9.3 8.9 - 08.1 mg/dL   Total Protein 8.1 6.5 - 8.1 g/dL   Albumin 4.8 3.5 - 5.0 g/dL   AST 17 15 - 41 U/L   ALT  13 0 - 44 U/L   Alkaline Phosphatase 39 38 - 126 U/L   Total Bilirubin 0.8 0.3 - 1.2 mg/dL   GFR, Estimated >86 >57 mL/min    Comment: (NOTE) Calculated using the CKD-EPI Creatinine Equation (2021)    Anion gap 9 5 - 15    Comment: Performed at [facility], [address] [place], [place] [address]  Ethanol     Status: Abnormal   Collection Time: 11/23/19  2:24 PM  Result Value Ref Range   Alcohol, Ethyl (B) 19 (H) <10 mg/dL    Comment: (NOTE) Lowest detectable limit for serum alcohol is 10 mg/dL.  For medical purposes only. Performed at [facility], [address] [place], [place] [address]   Salicylate level     Status: Abnormal   Collection Time: 11/23/19  2:24 PM  Result Value Ref Range   Salicylate Lvl <7.0 (L) 7.0 - 30.0 mg/dL    Comment: Performed at [facility], [address] [place], [place] [address]  Acetaminophen level     Status: Abnormal   Collection Time: 11/23/19  2:24 PM  Result Value Ref Range   Acetaminophen (Tylenol), Serum <10 (L) 10 - 30 ug/mL    Comment: (NOTE) Therapeutic concentrations vary significantly. A range of 10-30 ug/mL  may be an effective concentration for many patients. However, some  are best treated at concentrations outside of this range. Acetaminophen concentrations >150 ug/mL at 4 hours after ingestion  and >50 ug/mL at 12 hours after ingestion are often associated with  toxic reactions.  Performed at [facility], [address] [place], [place] [address]   cbc     Status: None   Collection Time: 11/23/19  2:24 PM  Result Value  Ref Range   WBC 10.4 4.0 - 10.5 K/uL   RBC 4.35 3.87 - 5.11 MIL/uL   Hemoglobin 13.9 12.0 - 15.0 g/dL   HCT 72.5 36 - 46 %   MCV 94.9 80.0 - 100.0 fL   MCH 32.0 26.0 - 34.0 pg   MCHC 33.7 30.0 - 36.0 g/dL   RDW 36.6 44.0 - 34.7 %   Platelets 259 150 - 400 K/uL   nRBC 0.0 0.0 - 0.2 %    Comment: Performed at [facility], [address] [place], [place] [address]  Urine Drug Screen, Qualitative     Status: Abnormal   Collection Time: 11/23/19  2:24 PM  Result Value Ref Range   Tricyclic, Ur Screen NONE DETECTED NONE DETECTED   Amphetamines, Ur Screen NONE DETECTED NONE DETECTED   MDMA (Ecstasy)Ur Screen NONE DETECTED NONE DETECTED   Cocaine Metabolite,Ur [place] POSITIVE (A) NONE DETECTED   Opiate, Ur Screen NONE DETECTED NONE DETECTED   Phencyclidine (PCP) Ur S NONE DETECTED NONE DETECTED   Cannabinoid 50 Ng, Ur [place] POSITIVE (A) NONE DETECTED   Barbiturates, Ur Screen NONE DETECTED NONE DETECTED   Benzodiazepine, Ur Scrn POSITIVE (A) NONE DETECTED   Methadone Scn, Ur NONE DETECTED NONE DETECTED    Comment: (NOTE) Tricyclics + metabolites, urine    Cutoff 1000 ng/mL Amphetamines + metabolites, urine  Cutoff 1000 ng/mL MDMA (Ecstasy), urine              Cutoff 500 ng/mL Cocaine Metabolite, urine          Cutoff 300 ng/mL Opiate + metabolites, urine        Cutoff 300 ng/mL Phencyclidine (PCP), urine  Cutoff 25 ng/mL Cannabinoid, urine                 Cutoff 50 ng/mL Barbiturates + metabolites, urine  Cutoff 200 ng/mL Benzodiazepine, urine              Cutoff 200 ng/mL Methadone, urine                   Cutoff 300 ng/mL  The urine drug screen provides only a preliminary, unconfirmed analytical test result and should not be used for non-medical purposes. Clinical consideration and professional judgment should be applied to any positive drug screen result due to possible interfering substances. A more specific alternate chemical method must be used in order to obtain a  confirmed analytical result. Gas chromatography / mass spectrometry (GC/MS) is the preferred confirm atory method. Performed at [facility], [address] [place], [place] [address]   Lithium level     Status: Abnormal   Collection Time: 11/23/19  2:24 PM  Result Value Ref Range   Lithium Lvl 0.13 (L) 0.60 - 1.20 mmol/L    Comment: Performed at [facility], [address] [place], [place] [address]  TSH     Status: None   Collection Time: 11/23/19  2:24 PM  Result Value Ref Range   TSH 1.047 0.350 - 4.500 uIU/mL    Comment: Performed by a 3rd Generation assay with a functional sensitivity of <=0.01 uIU/mL. Performed at [facility], [address] [place], [place] [address]   POC urine preg, ED     Status: None   Collection Time: 11/23/19  2:42 PM  Result Value Ref Range   Preg Test, Ur Negative Negative  Respiratory Panel by RT PCR (Flu A&B, Covid) - Nasopharyngeal Swab     Status: None   Collection Time: 11/23/19  4:23 PM   Specimen: Nasopharyngeal Swab  Result Value Ref Range   SARS Coronavirus 2 by RT PCR NEGATIVE NEGATIVE    Comment: (NOTE) SARS-CoV-2 target nucleic acids are NOT DETECTED.  The SARS-CoV-2 RNA is generally detectable in upper respiratoy specimens during the acute phase of infection. The lowest concentration of SARS-CoV-2 viral copies this assay can detect is 131 copies/mL. A negative result does not preclude SARS-Cov-2 infection and should not be used as the sole basis for treatment or other patient management decisions. A negative result may occur with  improper specimen collection/handling, submission of specimen other than nasopharyngeal swab, presence of viral mutation(s) within the areas targeted by this assay, and inadequate number of viral copies (<131 copies/mL). A negative result must be combined with clinical observations, patient history, and epidemiological information. The expected result is  Negative.  Fact Sheet for Patients:  https://www.moore.com/  Fact Sheet for Healthcare Providers:  https://www.young.biz/  This test is no t yet approved or cleared by the [place] FDA and  has been authorized for detection and/or diagnosis of SARS-CoV-2 by FDA under an Emergency Use Authorization (EUA). This EUA will remain  in effect (meaning this test can be used) for the duration of the COVID-19 declaration under Section 564(b)(1) of the Act, 21 U.S.C. section 360bbb-3(b)(1), unless the authorization is terminated or revoked sooner.     Influenza A by PCR NEGATIVE NEGATIVE   Influenza B by PCR NEGATIVE NEGATIVE    Comment: (NOTE) The Xpert Xpress SARS-CoV-2/FLU/RSV assay is intended as an aid in  the diagnosis of influenza from Nasopharyngeal swab specimens and  should not be used as  a sole basis for treatment. Nasal washings and  aspirates are unacceptable for Xpert Xpress SARS-CoV-2/FLU/RSV  testing.  Fact Sheet for Patients: https://www.moore.com/  Fact Sheet for Healthcare Providers: https://www.young.biz/  This test is not yet approved or cleared by the [place] FDA and  has been authorized for detection and/or diagnosis of SARS-CoV-2 by  FDA under an Emergency Use Authorization (EUA). This EUA will remain  in effect (meaning this test can be used) for the duration of the  Covid-19 declaration under Section 564(b)(1) of the Act, 21  U.S.C. section 360bbb-3(b)(1), unless the authorization is  terminated or revoked. Performed at [facility], [address] [place], [place] [address]     Blood Alcohol level:  Lab Results  Component Value Date   ETH 19 (H) 11/23/2019   ETH <10 10/14/2019    Metabolic Disorder Labs: No results found for: HGBA1C, MPG No results found for: PROLACTIN No results found for: CHOL, TRIG, HDL, CHOLHDL, VLDL, LDLCALC  Physical  Findings: AIMS: Facial and Oral Movements Muscles of Facial Expression: None, normal Lips and Perioral Area: None, normal Jaw: None, normal Tongue: None, normal,Extremity Movements Upper (arms, wrists, hands, fingers): None, normal Lower (legs, knees, ankles, toes): None, normal, Trunk Movements Neck, shoulders, hips: None, normal, Overall Severity Severity of abnormal movements (highest score from questions above): None, normal Incapacitation due to abnormal movements: None, normal Patient's awareness of abnormal movements (rate only patient's report): No Awareness, Dental Status Current problems with teeth and/or dentures?: No Does patient usually wear dentures?: No  CIWA:  CIWA-Ar Total: 3 COWS:  COWS Total Score: 2  Musculoskeletal: Strength & Muscle Tone: within normal limits Gait & Station: normal Patient leans: N/A  Psychiatric Specialty Exam: Physical Exam Vitals and nursing note reviewed.  Constitutional:      Appearance: Normal appearance.  HENT:     Head: Normocephalic and atraumatic.     Right Ear: External ear normal.     Left Ear: External ear normal.     Nose: Nose normal.     Mouth/Throat:     Mouth: Mucous membranes are moist.     Pharynx: Oropharynx is clear.  Eyes:     Extraocular Movements: Extraocular movements intact.     Conjunctiva/sclera: Conjunctivae normal.     Pupils: Pupils are equal, round, and reactive to light.  Cardiovascular:     Rate and Rhythm: Normal rate.     Pulses: Normal pulses.  Pulmonary:     Effort: Pulmonary effort is normal.     Breath sounds: Normal breath sounds.  Abdominal:     General: Abdomen is flat.     Palpations: Abdomen is soft.  Musculoskeletal:        General: No swelling. Normal range of motion.     Cervical back: Normal range of motion and neck supple.  Skin:    General: Skin is warm and dry.  Neurological:     General: No focal deficit present.     Mental Status: She is alert and oriented to person,  place, and time.  Psychiatric:        Attention and Perception: Attention and perception normal.        Mood and Affect: Mood is depressed. Affect is tearful.        Speech: Speech normal.        Behavior: Behavior is slowed and withdrawn.        Thought Content: Thought content includes suicidal ideation.  Cognition and Memory: Cognition and memory normal.        Judgment: Judgment normal.     Review of Systems  Constitutional: Negative for appetite change and fatigue.  HENT: Negative for rhinorrhea and sore throat.   Eyes: Negative for photophobia and visual disturbance.  Respiratory: Negative for cough and shortness of breath.   Cardiovascular: Negative for chest pain and palpitations.  Gastrointestinal: Negative for constipation, diarrhea, nausea and vomiting.  Endocrine: Negative for cold intolerance and heat intolerance.  Genitourinary: Negative for difficulty urinating and dysuria.  Musculoskeletal: Negative for arthralgias and myalgias.  Skin: Negative for rash and wound.  Allergic/Immunologic: Negative for environmental allergies and food allergies.  Neurological: Negative for dizziness and headaches.  Hematological: Negative for adenopathy. Does not bruise/bleed easily.  Psychiatric/Behavioral: Positive for dysphoric mood and suicidal ideas. The patient is nervous/anxious.     Blood pressure 103/75, pulse 77, temperature 98.1 F (36.7 C), temperature source Oral, resp. rate 17, height 5\' 7"  (1.702 m), weight 58.1 kg, last menstrual period 11/18/2019, SpO2 100 %, currently breastfeeding.Body mass index is 20.05 kg/m.  General Appearance: Fairly Groomed  Eye Contact:  Good  Speech:  Clear and Coherent  Volume:  Decreased  Mood:  Depressed and Dysphoric  Affect:  Congruent and Tearful  Thought Process:  Coherent  Orientation:  Full (Time, Place, and Person)  Thought Content:  Logical  Suicidal Thoughts:  Yes.  without intent/plan  Homicidal Thoughts:  No  Memory:   Immediate;   Fair Recent;   Fair Remote;   Fair  Judgement:  Fair  Insight:  Fair  Psychomotor Activity:  Normal  Concentration:  Concentration: Fair and Attention Span: Fair  Recall:  [employer]Fair  Fund of Knowledge:  Fair  Language:  Fair  Akathisia:  Negative  Handed:  Right  AIMS (if indicated):     Assets:  Communication Skills Desire for Improvement Financial Resources/Insurance Housing Intimacy Resilience Social Support  ADL's:  Intact  Cognition:  WNL  Sleep:  Number of Hours: 7.75     Treatment Plan Summary: Daily contact with patient to assess and evaluate symptoms and progress in treatment, Medication management and Plan continue medications as above. Encourage participation in groups.   Jesse SansMegan M Nazire Fruth, MD 11/25/2019, 1:26 PM

## 2019-11-25 NOTE — Plan of Care (Signed)
Pt rates depression 9/10, hopelessness 10/19 and anxiety 5/10. Pt denies SI, HI and AVH. Pt was educated on care plan and verbalizes understanding. Pt encouraged to attend groups. Torrie Mayers RN Problem: Education: Goal: Knowledge of disease or condition will improve Outcome: Progressing Goal: Understanding of discharge needs will improve Outcome: Progressing   Problem: Health Behavior/Discharge Planning: Goal: Ability to identify changes in lifestyle to reduce recurrence of condition will improve Outcome: Progressing Goal: Identification of resources available to assist in meeting health care needs will improve Outcome: Progressing   Problem: Physical Regulation: Goal: Complications related to the disease process, condition or treatment will be avoided or minimized Outcome: Progressing   Problem: Safety: Goal: Ability to remain free from injury will improve Outcome: Progressing   Problem: Education: Goal: Knowledge of [place] [facility] Education information/materials will improve Outcome: Progressing Goal: Emotional status will improve Outcome: Progressing Goal: Mental status will improve Outcome: Progressing Goal: Verbalization of understanding the information provided will improve Outcome: Progressing   Problem: Activity: Goal: Interest or engagement in activities will improve Outcome: Progressing Goal: Sleeping patterns will improve Outcome: Progressing   Problem: Coping: Goal: Ability to verbalize frustrations and anger appropriately will improve Outcome: Progressing Goal: Ability to demonstrate self-control will improve Outcome: Progressing   Problem: Health Behavior/Discharge Planning: Goal: Identification of resources available to assist in meeting health care needs will improve Outcome: Progressing Goal: Compliance with treatment plan for underlying cause of condition will improve Outcome: Progressing   Problem: Physical Regulation: Goal: Ability to  maintain clinical measurements within normal limits will improve Outcome: Progressing   Problem: Safety: Goal: Periods of time without injury will increase Outcome: Progressing

## 2019-11-25 NOTE — Progress Notes (Signed)
Recreation Therapy Notes  Date: 11/25/2019  Time: 9:30 am   Location: Craft room  Behavioral response: Appropriate  Intervention Topic: Coping Skills   Discussion/Intervention:  Group content on today was focused on coping skills. The group defined what coping skills are and when they normally use coping skills. Individuals described how they normally cope with thing and the coping skills they normally use. Patients expressed why it is important to cope with things and how not coping with things can affect you. The group participated in the intervention "My coping box" and made coping boxes while adding coping skills they could use in the future to the box. Clinical Observations/Feedback: Patient came to group and defined coping skills as tools to use to handle things. She identified her coping skills as walking, looking at pictures, talking and getting enough sleep. Individual was social with peers and staff while participating in the intervention.  Marcia West LRT/CTRS         Marcia West 11/25/2019 12:30 PM

## 2019-11-25 NOTE — Progress Notes (Signed)
Patient alert and oriented x 4, affect is flat but she brightens upon approach, she appears less anxious interacting appropriately with peers and staff, she currently denies SI/HI/AVH, 15 minutes safety checks maintained.

## 2019-11-25 NOTE — Progress Notes (Signed)
D- Patient alert and oriented. Affect/mood is lethargic,calm and cooperative . Pt denies SI, HI, AVH, and pain.   A- Scheduled medications administered to patient, per MD orders. Support and encouragement provided.  Routine safety checks conducted every 15 minutes.  Patient informed to notify staff with problems or concerns.  R- No adverse drug reactions noted. Patient contracts for safety at this time. Patient compliant with medications and treatment plan. Patient receptive, calm, and cooperative. Patient interacts well with others on the unit.  Patient remains safe at this time.  Torrie Mayers RN

## 2019-11-25 NOTE — Progress Notes (Signed)
Recreation Therapy Notes  INPATIENT RECREATION THERAPY ASSESSMENT  Patient Details Name: Marcia West MRN: [id number] DOB: [birth year] Today's Date: 11/25/2019       Information Obtained From: Patient  Able to Participate in Assessment/Interview: Yes  Patient Presentation: Responsive  Reason for Admission (Per Patient): Active Symptoms  Patient Stressors:    Coping Skills:   Talk, Art  Leisure Interests (2+):  [employer] - [employer], Technical brewer - Hiking, [employer] - [employer], Technical brewer - Camping, Banker care, Social - Family, Individual - Reading, Crafts - Knitting/Crocheting, Individual - Writing (Origami)  Frequency of Recreation/Participation: Monthly  Awareness of Community Resources:  Yes  Community Resources:  Park, Other (Comment) (AA)  Current Use:    If no, Barriers?:    Expressed Interest in [employer] Information:    [place]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

## 2019-11-26 NOTE — Plan of Care (Signed)
Pt rates depression 8/10 and anxiety 5/10. Pt denies SI, HI and AVH. Pt was educated on care plan and verbalizes understanding. Torrie Mayers RN Problem: Education: Goal: Knowledge of disease or condition will improve Outcome: Progressing Goal: Understanding of discharge needs will improve Outcome: Progressing   Problem: Health Behavior/Discharge Planning: Goal: Ability to identify changes in lifestyle to reduce recurrence of condition will improve Outcome: Progressing Goal: Identification of resources available to assist in meeting health care needs will improve Outcome: Progressing   Problem: Physical Regulation: Goal: Complications related to the disease process, condition or treatment will be avoided or minimized Outcome: Progressing   Problem: Safety: Goal: Ability to remain free from injury will improve Outcome: Progressing   Problem: Education: Goal: Knowledge of [facility] Education information/materials will improve Outcome: Progressing Goal: Emotional status will improve Outcome: Progressing Goal: Mental status will improve Outcome: Progressing Goal: Verbalization of understanding the information provided will improve Outcome: Progressing   Problem: Activity: Goal: Interest or engagement in activities will improve Outcome: Progressing Goal: Sleeping patterns will improve Outcome: Progressing   Problem: Coping: Goal: Ability to verbalize frustrations and anger appropriately will improve Outcome: Progressing Goal: Ability to demonstrate self-control will improve Outcome: Progressing   Problem: Health Behavior/Discharge Planning: Goal: Identification of resources available to assist in meeting health care needs will improve Outcome: Progressing Goal: Compliance with treatment plan for underlying cause of condition will improve Outcome: Progressing   Problem: Physical Regulation: Goal: Ability to maintain clinical measurements within normal limits will  improve Outcome: Progressing   Problem: Safety: Goal: Periods of time without injury will increase Outcome: Progressing

## 2019-11-26 NOTE — [facility] Group Notes (Signed)
  [facility] LCSW Group Therapy Note  Date/Time:  11/26/2019 1:07 PM- 1:48 PM   Type of Therapy and Topic:  Group Therapy:  Feelings About Hospitalization  Participation Level:  Active   Description of Group This process group involved patients discussing their feelings related to being hospitalized, as well as the benefits they see to being in the hospital.  These feelings and benefits were itemized.  The group then brainstormed specific ways in which they could seek those same benefits when they discharge and return home.  Therapeutic Goals 1. Patient will identify and describe positive and negative feelings related to hospitalization 2. Patient will verbalize benefits of hospitalization to themselves personally 3. Patients will brainstorm together ways they can obtain similar benefits in the outpatient setting, identify barriers to wellness and possible solutions  Summary of Patient Progress:  Patient checked into group feeling a lot better than what she did a few days ago. Patient stated a positive for her in lack of negative influences, lack of access to bad things, bed, food, and heat. Patient stated that talking with CSW who listens to patients is also a positive. Patient stated the negatives are the stigma about being in the hospital and vitals at 6 AM. Patient stated that she changed her number so the same people who aren't good influences do not contact her. Patient also stated she will stay away from them.   Therapeutic Modalities Cognitive Behavioral Therapy Motivational Interviewing    [name], [place] 11/26/2019  3:04 PM

## 2019-11-26 NOTE — Progress Notes (Signed)
Pt has been lethargic, calm and cooperative today. She has slept a lot. Pt has attended all of the groups. She has received several phone calls from family and friends. She says that this evening she feels "alright". Torrie Mayers RN

## 2019-11-26 NOTE — Progress Notes (Signed)
Patient alert and oriented x 4, affect is flat but she brightens upon approach, she appears less anxious interacting appropriately with peers and staff, she currently denies SI/HI/AVH, she was emotional support, 15 minutes safety checks maintained will continue to monitor.

## 2019-11-26 NOTE — Progress Notes (Signed)
[facility] MD Progress Note  11/26/2019 10:29 AM Marcia West  MRN:  [id number]   Principal Problem: Bipolar depression (HCC) Diagnosis: Principal Problem:   Bipolar depression (HCC) Active Problems:   Opioid use disorder   Insomnia secondary to anxiety   Alcohol abuse   Cocaine abuse (HCC)  34 year old female with bipolar I disorder presented to emergency room voluntarily for worsening depression and suicidal ideation without plan.   Interval History Patient was seen today for re-evaluation.  Nursing reports no events overnight. The patient has no issues with performing ADLs.  Patient has been medication compliant.    Subjective:  On assessment patient reports feeling "depressed still and I don`t know why". She took Jordan twice by now, denies side effects and reports she does not see any improvement yet, except of not feeling suicidal anymore. She reports her sleep improved too with Trazodone. She has been participating in groups, and interacting with peers appropriately. She denies any physical complaints. She denies auditory/visual hallucinations and reports feeling safe here in the hospital. No current symptoms of withdrawal, no cravings to use substances on current dose of Suboxone.   Labs: no new results for review.  Total Time spent with patient: 20 minutes  Past Psychiatric History: see H&P   Past Medical History:  Past Medical History:  Diagnosis Date  . Anxiety   . Bipolar affective (HCC)   . Depression   . Herpes simplex type II infection   . Substance abuse [facility])     Past Surgical History:  Procedure Laterality Date  . NO PAST SURGERIES    . WISDOM TOOTH EXTRACTION     Family History:  Family History  Problem Relation Age of Onset  . Arthritis Maternal Grandmother   . Diabetes Maternal Grandmother   . Diabetes Maternal Grandfather   . Obesity Paternal Grandmother    Family Psychiatric  History: see H&P  Social History:  Social History   Substance and  Sexual Activity  Alcohol Use Not Currently     Social History   Substance and Sexual Activity  Drug Use Not Currently  . Types: Methamphetamines, Other-see comments, Marijuana, "Crack" cocaine   Comment: suboxone daily    Social History   Socioeconomic History  . Marital status: Single    Spouse name: Not on file  . Number of children: Not on file  . Years of education: Not on file  . Highest education level: Not on file  Occupational History  . Not on file  Tobacco Use  . Smoking status: Current Every Day Smoker    Packs/day: 0.25    Types: Cigarettes  . Smokeless tobacco: Never Used  . Tobacco comment: 4 cigs per day  Vaping Use  . Vaping Use: Never used  Substance and Sexual Activity  . Alcohol use: Not Currently  . Drug use: Not Currently    Types: Methamphetamines, Other-see comments, Marijuana, "Crack" cocaine    Comment: suboxone daily  . Sexual activity: Not Currently    Birth control/protection: None  Other Topics Concern  . Not on file  Social History Narrative  . Not on file   Social Determinants of Health   Financial Resource Strain:   . Difficulty of Paying Living Expenses: Not on file  Food Insecurity:   . Worried About Programme researcher, broadcasting/film/video in the Last Year: Not on file  . Ran Out of Food in the Last Year: Not on file  Transportation Needs:   . Lack of Transportation (Medical): Not on  file  . Lack of Transportation (Non-Medical): Not on file  Physical Activity:   . Days of Exercise per Week: Not on file  . Minutes of Exercise per Session: Not on file  Stress:   . Feeling of Stress : Not on file  Social Connections:   . Frequency of Communication with Friends and Family: Not on file  . Frequency of Social Gatherings with Friends and Family: Not on file  . Attends Religious Services: Not on file  . Active Member of Clubs or Organizations: Not on file  . Attends Banker Meetings: Not on file  . Marital Status: Not on file    Additional Social History:                         Sleep: Good  Appetite:  Good  Current Medications: Current Facility-Administered Medications  Medication Dose Route Frequency Provider Last Rate Last Admin  . acetaminophen (TYLENOL) tablet 650 mg  650 mg Oral Q4H PRN Clapacs, John T, MD      . alum & mag hydroxide-simeth (MAALOX/MYLANTA) 200-200-20 MG/5ML suspension 30 mL  30 mL Oral Q6H PRN Clapacs, John T, MD      . buprenorphine-naloxone (SUBOXONE) 2-0.5 mg per SL tablet 1 tablet  1 tablet Sublingual Daily Clapacs, Jackquline Denmark, MD   1 tablet at 11/26/19 [phone]  . clonazePAM (KLONOPIN) tablet 1 mg  1 mg Oral BID Clapacs, Jackquline Denmark, MD   1 mg at 11/26/19 0814  . feeding supplement (ENSURE ENLIVE / ENSURE PLUS) liquid 237 mL  237 mL Oral TID BM Jesse Sans, MD   237 mL at 11/25/19 2100  . hydrOXYzine (ATARAX/VISTARIL) tablet 25 mg  25 mg Oral TID PRN Jesse Sans, MD   25 mg at 11/25/19 1510  . lurasidone (LATUDA) tablet 40 mg  40 mg Oral Q supper Jesse Sans, MD   40 mg at 11/25/19 1651  . magnesium hydroxide (MILK OF MAGNESIA) suspension 30 mL  30 mL Oral Daily PRN Clapacs, John T, MD      . nicotine (NICODERM CQ - dosed in mg/24 hours) patch 21 mg  21 mg Transdermal Daily Clapacs, Jackquline Denmark, MD   21 mg at 11/26/19 0814  . ondansetron (ZOFRAN) tablet 4 mg  4 mg Oral Q8H PRN Clapacs, John T, MD      . traZODone (DESYREL) tablet 150 mg  150 mg Oral QHS PRN Gillermo Murdoch, NP   150 mg at 11/25/19 2128    Lab Results: No results found for this or any previous visit (from the past 48 hour(s)).  Blood Alcohol level:  Lab Results  Component Value Date   ETH 19 (H) 11/23/2019   ETH <10 10/14/2019    Metabolic Disorder Labs: No results found for: HGBA1C, MPG No results found for: PROLACTIN No results found for: CHOL, TRIG, HDL, CHOLHDL, VLDL, LDLCALC  Physical Findings: AIMS: Facial and Oral Movements Muscles of Facial Expression: None, normal Lips and Perioral  Area: None, normal Jaw: None, normal Tongue: None, normal,Extremity Movements Upper (arms, wrists, hands, fingers): None, normal Lower (legs, knees, ankles, toes): None, normal, Trunk Movements Neck, shoulders, hips: None, normal, Overall Severity Severity of abnormal movements (highest score from questions above): None, normal Incapacitation due to abnormal movements: None, normal Patient's awareness of abnormal movements (rate only patient's report): No Awareness, Dental Status Current problems with teeth and/or dentures?: No Does patient usually wear dentures?: No  CIWA:  CIWA-Ar Total: 3 COWS:  COWS Total Score: 2  Musculoskeletal: Strength & Muscle Tone: within normal limits Gait & Station: normal Patient leans: Right  Psychiatric Specialty Exam: Physical Exam  Review of Systems  Blood pressure 100/68, pulse 68, temperature 98.4 F (36.9 C), temperature source Oral, resp. rate 18, height 5\' 7"  (1.702 m), weight 58.1 kg, last menstrual period 11/18/2019, SpO2 98 %, currently breastfeeding.Body mass index is 20.05 kg/m.  General Appearance: Casual  Eye Contact:  Good  Speech:  Normal Rate  Volume:  Normal  Mood:  Depressed  Affect:  Appropriate, Congruent and Constricted  Thought Process:  Coherent and Goal Directed  Orientation:  Full (Time, Place, and Person)  Thought Content:  Logical  Suicidal Thoughts:  No  Homicidal Thoughts:  No  Memory:  Immediate;   Fair Recent;   Fair Remote;   Fair  Judgement:  Fair  Insight:  Fair  Psychomotor Activity:  Normal  Concentration:  Concentration: Fair and Attention Span: Fair  Recall:  13/05/2019 of Knowledge:  Fair  Language:  Fair  Akathisia:  No  Handed:  Right  AIMS (if indicated):     Assets:  Communication Skills Desire for Improvement Financial Resources/Insurance Housing Intimacy Resilience Social Support  ADL's:  Intact  Cognition:  WNL  Sleep:  Number of Hours: 8     Treatment Plan Summary: Daily  contact with patient to assess and evaluate symptoms and progress in treatment and Medication management   Patient is a 34 year old female with the above-stated past psychiatric history who is seen in follow-up.  Chart reviewed. Patient discussed with nursing. Patient continues to express depressed mood, although she denies suicidal thoughts today, reports better sleep and objectively she appears brighter, not tearful anymore. Current medication (Latuda) was started two days ago and seems to be effective so far. Anxiety is well-managed by Klonopin and opioid addiction is managed by Suboxone. I see no indication for med changes today.   Plan:  -continue inpatient psych admission; 15-minute checks; daily contact with patient to assess and evaluate symptoms and progress in treatment; psychoeducation.  -continue scheduled medications: . buprenorphine-naloxone  1 tablet Sublingual Daily  . clonazePAM  1 mg Oral BID  . feeding supplement  237 mL Oral TID BM  . lurasidone  40 mg Oral Q supper  . nicotine  21 mg Transdermal Daily    -continue PRN medications.  acetaminophen, alum & mag hydroxide-simeth, hydrOXYzine, magnesium hydroxide, ondansetron, traZODone  -Pertinent Labs: no new labs ordered today   -Disposition: Estimated duration of hospitalization: midweek next week. All necessary aftercare will be arranged prior to discharge Likely d/c home with outpatient psych follow-up.  -  I certify that the patient does need, on a daily basis, active treatment furnished directly by or requiring the supervision of inpatient psychiatric facility personnel.   33, MD 11/26/2019, 10:29 AM

## 2019-11-27 NOTE — Progress Notes (Signed)
[facility] MD Progress Note  11/27/2019 11:23 AM Marcia West  MRN:  [id number]   Principal Problem: Bipolar depression (HCC) Diagnosis: Principal Problem:   Bipolar depression (HCC) Active Problems:   Opioid use disorder   Insomnia secondary to anxiety   Alcohol abuse   Cocaine abuse (HCC)  34 year old female with bipolar I disorder presented to emergency room voluntarily for worsening depression and suicidal ideation without plan.   Interval History Patient was seen today for re-evaluation.  Nursing reports no events overnight. The patient has no issues with performing ADLs.  Patient has been medication compliant.    Subjective:  On assessment patient reports "I feel better. I`d like to know what my discharge plan is". Reports feeling "less depressed" on Latuda, denies side effects. She reports not feeling suicidal anymore. She reports her sleep is "very good" with Trazodone. She has been participating in groups, and interacting with peers appropriately. She denies any physical complaints. She denies auditory/visual hallucinations and reports feeling safe here in the hospital. No current symptoms of withdrawal, no cravings to use substances on current dose of Suboxone.   Labs: no new results for review.  Total Time spent with patient: 20 minutes  Past Psychiatric History: see H&P   Past Medical History:  Past Medical History:  Diagnosis Date  . Anxiety   . Bipolar affective (HCC)   . Depression   . Herpes simplex type II infection   . Substance abuse [facility])     Past Surgical History:  Procedure Laterality Date  . NO PAST SURGERIES    . WISDOM TOOTH EXTRACTION     Family History:  Family History  Problem Relation Age of Onset  . Arthritis Maternal Grandmother   . Diabetes Maternal Grandmother   . Diabetes Maternal Grandfather   . Obesity Paternal Grandmother    Family Psychiatric  History: see H&P  Social History:  Social History   Substance and Sexual Activity   Alcohol Use Not Currently     Social History   Substance and Sexual Activity  Drug Use Not Currently  . Types: Methamphetamines, Other-see comments, Marijuana, "Crack" cocaine   Comment: suboxone daily    Social History   Socioeconomic History  . Marital status: Single    Spouse name: Not on file  . Number of children: Not on file  . Years of education: Not on file  . Highest education level: Not on file  Occupational History  . Not on file  Tobacco Use  . Smoking status: Current Every Day Smoker    Packs/day: 0.25    Types: Cigarettes  . Smokeless tobacco: Never Used  . Tobacco comment: 4 cigs per day  Vaping Use  . Vaping Use: Never used  Substance and Sexual Activity  . Alcohol use: Not Currently  . Drug use: Not Currently    Types: Methamphetamines, Other-see comments, Marijuana, "Crack" cocaine    Comment: suboxone daily  . Sexual activity: Not Currently    Birth control/protection: None  Other Topics Concern  . Not on file  Social History Narrative  . Not on file   Social Determinants of Health   Financial Resource Strain:   . Difficulty of Paying Living Expenses: Not on file  Food Insecurity:   . Worried About Programme researcher, broadcasting/film/video in the Last Year: Not on file  . Ran Out of Food in the Last Year: Not on file  Transportation Needs:   . Lack of Transportation (Medical): Not on file  . Lack  of Transportation (Non-Medical): Not on file  Physical Activity:   . Days of Exercise per Week: Not on file  . Minutes of Exercise per Session: Not on file  Stress:   . Feeling of Stress : Not on file  Social Connections:   . Frequency of Communication with Friends and Family: Not on file  . Frequency of Social Gatherings with Friends and Family: Not on file  . Attends Religious Services: Not on file  . Active Member of Clubs or Organizations: Not on file  . Attends Banker Meetings: Not on file  . Marital Status: Not on file   Additional Social  History:                         Sleep: Good  Appetite:  Good  Current Medications: Current Facility-Administered Medications  Medication Dose Route Frequency Provider Last Rate Last Admin  . acetaminophen (TYLENOL) tablet 650 mg  650 mg Oral Q4H PRN Clapacs, John T, MD      . alum & mag hydroxide-simeth (MAALOX/MYLANTA) 200-200-20 MG/5ML suspension 30 mL  30 mL Oral Q6H PRN Clapacs, John T, MD      . buprenorphine-naloxone (SUBOXONE) 2-0.5 mg per SL tablet 1 tablet  1 tablet Sublingual Daily Clapacs, Jackquline Denmark, MD   1 tablet at 11/27/19 0853  . clonazePAM (KLONOPIN) tablet 1 mg  1 mg Oral BID Clapacs, Jackquline Denmark, MD   1 mg at 11/27/19 0853  . feeding supplement (ENSURE ENLIVE / ENSURE PLUS) liquid 237 mL  237 mL Oral TID BM Jesse Sans, MD   237 mL at 11/27/19 0911  . hydrOXYzine (ATARAX/VISTARIL) tablet 25 mg  25 mg Oral TID PRN Jesse Sans, MD   25 mg at 11/26/19 1307  . lurasidone (LATUDA) tablet 40 mg  40 mg Oral Q supper Jesse Sans, MD   40 mg at 11/26/19 1723  . magnesium hydroxide (MILK OF MAGNESIA) suspension 30 mL  30 mL Oral Daily PRN Clapacs, Jackquline Denmark, MD   30 mL at 11/27/19 0854  . nicotine (NICODERM CQ - dosed in mg/24 hours) patch 21 mg  21 mg Transdermal Daily Clapacs, Jackquline Denmark, MD   21 mg at 11/27/19 0853  . ondansetron (ZOFRAN) tablet 4 mg  4 mg Oral Q8H PRN Clapacs, John T, MD      . traZODone (DESYREL) tablet 150 mg  150 mg Oral QHS PRN Gillermo Murdoch, NP   150 mg at 11/26/19 2127    Lab Results: No results found for this or any previous visit (from the past 48 hour(s)).  Blood Alcohol level:  Lab Results  Component Value Date   ETH 19 (H) 11/23/2019   ETH <10 10/14/2019    Metabolic Disorder Labs: No results found for: HGBA1C, MPG No results found for: PROLACTIN No results found for: CHOL, TRIG, HDL, CHOLHDL, VLDL, LDLCALC  Physical Findings: AIMS: Facial and Oral Movements Muscles of Facial Expression: None, normal Lips and Perioral  Area: None, normal Jaw: None, normal Tongue: None, normal,Extremity Movements Upper (arms, wrists, hands, fingers): None, normal Lower (legs, knees, ankles, toes): None, normal, Trunk Movements Neck, shoulders, hips: None, normal, Overall Severity Severity of abnormal movements (highest score from questions above): None, normal Incapacitation due to abnormal movements: None, normal Patient's awareness of abnormal movements (rate only patient's report): No Awareness, Dental Status Current problems with teeth and/or dentures?: No Does patient usually wear dentures?: No  CIWA:  CIWA-Ar  Total: 3 COWS:  COWS Total Score: 2  Musculoskeletal: Strength & Muscle Tone: within normal limits Gait & Station: normal Patient leans: Right  Psychiatric Specialty Exam: Physical Exam   Review of Systems   Blood pressure 97/72, pulse 85, temperature 97.7 F (36.5 C), temperature source Oral, resp. rate 18, height 5\' 7"  (1.702 m), weight 58.1 kg, last menstrual period 11/18/2019, SpO2 100 %, currently breastfeeding.Body mass index is 20.05 kg/m.  General Appearance: Casual  Eye Contact:  Good  Speech:  Normal Rate  Volume:  Normal  Mood:  "better, less depressed"  Affect:  Appropriate, Congruent and Constricted  Thought Process:  Coherent and Goal Directed  Orientation:  Full (Time, Place, and Person)  Thought Content:  Logical  Suicidal Thoughts:  No  Homicidal Thoughts:  No  Memory:  Immediate;   Fair Recent;   Fair Remote;   Fair  Judgement:  Fair  Insight:  Fair  Psychomotor Activity:  Normal  Concentration:  Concentration: Fair and Attention Span: Fair  Recall:  13/05/2019 of Knowledge:  Fair  Language:  Fair  Akathisia:  No  Handed:  Right  AIMS (if indicated):     Assets:  Communication Skills Desire for Improvement Financial Resources/Insurance Housing Intimacy Resilience Social Support  ADL's:  Intact  Cognition:  WNL  Sleep:  Number of Hours: 8     Treatment Plan  Summary: Daily contact with patient to assess and evaluate symptoms and progress in treatment and Medication management   Patient is a 34 year old female with the above-stated past psychiatric history who is seen in follow-up.  Chart reviewed. Patient discussed with nursing. Patient reports partial mood improvement - she is less depressed, she denies suicidal thoughts today. She reports good sleep. She appears brighter, not tearful, visible in the milieu, participates in groups. Current medication (Latuda) was started three days ago and seems to be effective. Anxiety is well-managed by Klonopin and opioid addiction is managed by Suboxone. No med changes made today.   Plan:  -continue inpatient psych admission; 15-minute checks; daily contact with patient to assess and evaluate symptoms and progress in treatment; psychoeducation.  -continue scheduled medications: . buprenorphine-naloxone  1 tablet Sublingual Daily  . clonazePAM  1 mg Oral BID  . feeding supplement  237 mL Oral TID BM  . lurasidone  40 mg Oral Q supper  . nicotine  21 mg Transdermal Daily    -continue PRN medications.  acetaminophen, alum & mag hydroxide-simeth, hydrOXYzine, magnesium hydroxide, ondansetron, traZODone  -Pertinent Labs: no new labs ordered today   -Disposition: Estimated duration of hospitalization: midweek next week. All necessary aftercare will be arranged prior to discharge Likely d/c home with outpatient psych follow-up.  -  I certify that the patient does need, on a daily basis, active treatment furnished directly by or requiring the supervision of inpatient psychiatric facility personnel.   32, MD 11/27/2019, 11:23 AM

## 2019-11-27 NOTE — [facility] Group Notes (Signed)
LCSW Group Therapy Note   11/27/2019 1:00 PM- 2:03 PM    Type of Therapy and Topic: Group Therapy: Fears and Unhealthy Coping Skills   Participation Level: Active   Description of Group: The focus of this group was to discuss some of the prevalent fears that patients experience, and to identify the commonalities among group members. An exercise was used to initiate the discussion, followed by writing on the white board a group-generated list of unhealthy coping and healthy coping techniques to deal with each fear.   Therapeutic Goals: 1. Patient will identify and describe 3 fears they experience 2. Patient will identify one positive coping strategy for each fear they experience 3. Patient will respond empathically to peers statements regarding fears they experience   Summary of Patient Progress: Patient checked into group feeling tired. Patient stated that she is fearful of relapsing and losing her daughter. 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 Patient is nervous about discharging and going back to her old ways. Patient stated the last time she was here she had to wait 3 weeks to see a therapist. Patient stated the therapist only suggested that she switch locations to be closer to home. Patient stated her first goal is to find a new therapist and add healthy supports.     Therapeutic Modalities Cognitive Behavioral Therapy Motivational Interviewing    [name], [place] 11/27/2019 3:24 PM

## 2019-11-27 NOTE — Plan of Care (Signed)
  Problem: Education: Goal: Knowledge of [facility] Education information/materials will improve Outcome: Progressing Goal: Emotional status will improve Outcome: Progressing Goal: Mental status will improve Outcome: Progressing Goal: Verbalization of understanding the information provided will improve Outcome: Progressing   Problem: Activity: Goal: Interest or engagement in activities will improve Outcome: Progressing Goal: Sleeping patterns will improve Outcome: Progressing   Problem: Coping: Goal: Ability to verbalize frustrations and anger appropriately will improve Outcome: Progressing Goal: Ability to demonstrate self-control will improve Outcome: Progressing   Problem: Health Behavior/Discharge Planning: Goal: Identification of resources available to assist in meeting health care needs will improve Outcome: Progressing Goal: Compliance with treatment plan for underlying cause of condition will improve Outcome: Progressing   Problem: Physical Regulation: Goal: Ability to maintain clinical measurements within normal limits will improve Outcome: Progressing   Problem: Safety: Goal: Periods of time without injury will increase Outcome: Progressing   Problem: Education: Goal: Knowledge of disease or condition will improve Outcome: Progressing Goal: Understanding of discharge needs will improve Outcome: Progressing   Problem: Health Behavior/Discharge Planning: Goal: Ability to identify changes in lifestyle to reduce recurrence of condition will improve Outcome: Progressing Goal: Identification of resources available to assist in meeting health care needs will improve Outcome: Progressing   Problem: Physical Regulation: Goal: Complications related to the disease process, condition or treatment will be avoided or minimized Outcome: Progressing   Problem: Safety: Goal: Ability to remain free from injury will improve Outcome: Progressing   

## 2019-11-27 NOTE — Progress Notes (Signed)
Patient alert and oriented x 4, affect is flat but she brightens upon approach, she appears less anxious, forwards more information, interacting appropriately with peers and staff, she currently denies SI/HI/AVH, she was offered emotional support, 15 minutes safety checks maintained will continue to monitor.

## 2019-11-27 NOTE — Plan of Care (Signed)
D- Patient alert and oriented. Patient presents in a pleasant mood on assessment stating that she slept good last night and had no complaints to voice to this Clinical research associate. Patient denies anxiety, but endorsed depression, stating that "my situation, when I got here", is why she feels this way. Patient also denies SI, HI, AVH, and pain at this time. Patient's goal for today is to "go outside" and "fix discharge plan". Patient responded on her self-inventory that she would be the reason that she wouldn't follow her discharge plan.  A- Scheduled medications administered to patient, per MD orders. Support and encouragement provided.  Routine safety checks conducted every 15 minutes.  Patient informed to notify staff with problems or concerns.  R- No adverse drug reactions noted. Patient contracts for safety at this time. Patient compliant with medications and treatment plan. Patient receptive, calm, and cooperative. Patient interacts well with others on the unit.  Patient remains safe at this time.  Problem: Education: Goal: Knowledge of [facility] Education information/materials will improve Outcome: Progressing Goal: Emotional status will improve Outcome: Progressing Goal: Mental status will improve Outcome: Progressing Goal: Verbalization of understanding the information provided will improve Outcome: Progressing   Problem: Activity: Goal: Interest or engagement in activities will improve Outcome: Progressing Goal: Sleeping patterns will improve Outcome: Progressing   Problem: Coping: Goal: Ability to verbalize frustrations and anger appropriately will improve Outcome: Progressing Goal: Ability to demonstrate self-control will improve Outcome: Progressing   Problem: Health Behavior/Discharge Planning: Goal: Identification of resources available to assist in meeting health care needs will improve Outcome: Progressing Goal: Compliance with treatment plan for underlying cause of  condition will improve Outcome: Progressing   Problem: Physical Regulation: Goal: Ability to maintain clinical measurements within normal limits will improve Outcome: Progressing   Problem: Safety: Goal: Periods of time without injury will increase Outcome: Progressing   Problem: Education: Goal: Knowledge of disease or condition will improve Outcome: Progressing Goal: Understanding of discharge needs will improve Outcome: Progressing   Problem: Health Behavior/Discharge Planning: Goal: Ability to identify changes in lifestyle to reduce recurrence of condition will improve Outcome: Progressing Goal: Identification of resources available to assist in meeting health care needs will improve Outcome: Progressing   Problem: Physical Regulation: Goal: Complications related to the disease process, condition or treatment will be avoided or minimized Outcome: Progressing   Problem: Safety: Goal: Ability to remain free from injury will improve Outcome: Progressing

## 2019-11-27 NOTE — Progress Notes (Signed)
Patient is alert and oriented. She is pleasant and easy to engage.  She denies SI  HI  AVH  anxiety and pain at this encounter.  She does endorse depression, but states that she is able to manage.  She is med compliant and tolerated her medication without incident. She is safe on the unit wuth 15 minute safety checks and encouraged to contact staff with any concerns.    Cleo Butler-Nicholson, LPN

## 2019-11-28 MED ORDER — BENZOCAINE 10 % MT GEL
Freq: Three times a day (TID) | OROMUCOSAL | Status: DC | PRN
  Filled 2019-11-28: qty 9

## 2019-11-28 NOTE — Plan of Care (Signed)
  Problem: Decision Making Goal: STG - Patient will successfully identify 2 ways of making healthy decisions post d/c within 5 recreation therapy group sessions Description: STG - Patient will successfully identify 2 ways of making healthy decisions post d/c within 5 recreation therapy group sessions Outcome: Progressing   

## 2019-11-28 NOTE — [facility] Counselor (Signed)
CSW received voicemail that the pt is not eligible for [employer] due to having Medicaid.  [name], MSW, LCSW 11/28/2019 10:29 AM

## 2019-11-28 NOTE — Progress Notes (Signed)
Recreation Therapy Notes   Date: 11/28/2019  Time: 9:30 am   Location: Craft room  Behavioral response: Appropriate  Intervention Topic: Stress Management    Discussion/Intervention:  Group content on today was focused on stress. The group defined stress and way to cope with stress. Participants expressed how they know when they are stresses out. Individuals described the different ways they have to cope with stress. The group stated reasons why it is important to cope with stress. Patient explained what good stress is and some examples. The group participated in the intervention "Stress Management". Individuals were separated into two group and answered questions related to stress.  Clinical Observations/Feedback: Patient came to group and defined stress management as using coping skills to manage stress.She expressed that she manages stress by idnentifying what the stressors is. Individual was social with peers and staff while participating in the intervention.  Shravan Salahuddin LRT/CTRS         Curtiss Mahmood 11/28/2019 2:10 PM

## 2019-11-28 NOTE — Plan of Care (Signed)
D- Patient alert and oriented. Patient presents in a pleasant mood on assessment stating that she slept "good, until my tooth started bothering me". Patient reported her tooth pain a "7/10", and stated that the Tylenol she received earlier this morning was of no help. Patient denies anxiety, but continues to endorse some depression, however, she feels like it's "pretty good" right now. Patient also denies SI, HI, AVH at this time. Patient's goal for today is "discharge plan", in which she will "talk to the social worker", in order to achieve her goal.  A- Scheduled medications administered to patient, per MD orders. Support and encouragement provided.  Routine safety checks conducted every 15 minutes.  Patient informed to notify staff with problems or concerns.  R- No adverse drug reactions noted. Patient contracts for safety at this time. Patient compliant with medications and treatment plan. Patient receptive, calm, and cooperative. Patient interacts well with others on the unit.  Patient remains safe at this time.  Problem: Education: Goal: Knowledge of [facility] Education information/materials will improve Outcome: Progressing Goal: Emotional status will improve Outcome: Progressing Goal: Mental status will improve Outcome: Progressing Goal: Verbalization of understanding the information provided will improve Outcome: Progressing   Problem: Activity: Goal: Interest or engagement in activities will improve Outcome: Progressing Goal: Sleeping patterns will improve Outcome: Progressing   Problem: Coping: Goal: Ability to verbalize frustrations and anger appropriately will improve Outcome: Progressing Goal: Ability to demonstrate self-control will improve Outcome: Progressing   Problem: Health Behavior/Discharge Planning: Goal: Identification of resources available to assist in meeting health care needs will improve Outcome: Progressing Goal: Compliance with treatment plan  for underlying cause of condition will improve Outcome: Progressing   Problem: Physical Regulation: Goal: Ability to maintain clinical measurements within normal limits will improve Outcome: Progressing   Problem: Safety: Goal: Periods of time without injury will increase Outcome: Progressing   Problem: Education: Goal: Knowledge of disease or condition will improve Outcome: Progressing Goal: Understanding of discharge needs will improve Outcome: Progressing   Problem: Health Behavior/Discharge Planning: Goal: Ability to identify changes in lifestyle to reduce recurrence of condition will improve Outcome: Progressing Goal: Identification of resources available to assist in meeting health care needs will improve Outcome: Progressing   Problem: Physical Regulation: Goal: Complications related to the disease process, condition or treatment will be avoided or minimized Outcome: Progressing   Problem: Safety: Goal: Ability to remain free from injury will improve Outcome: Progressing

## 2019-11-28 NOTE — Progress Notes (Signed)
   11/28/19 1400  Clinical Encounter Type  Visited With Patient  Visit Type Initial;Spiritual support;Social support;Behavioral Health  Referral From Chaplain  Consult/Referral To Chaplain  Pt attended Ch group today. Pt participated in group. Ch will follow-up with Pt.  

## 2019-11-28 NOTE — Progress Notes (Signed)
[facility] MD Progress Note  11/28/2019 12:13 PM Marcia West  MRN:  [id number]   Principal Problem: Bipolar depression (HCC) Diagnosis: Principal Problem:   Bipolar depression (HCC) Active Problems:   Opioid use disorder   Insomnia secondary to anxiety   Alcohol abuse   Cocaine abuse (HCC)  34 year old female with bipolar I disorder presented to emergency room voluntarily for worsening depression and suicidal ideation without plan.   Interval History Patient was seen today for re-evaluation.  Nursing reports no events overnight. The patient has no issues with performing ADLs.  Patient has been medication compliant.    Subjective:  On assessment patient reports "I feel better, but still not ready to leave". Reports feeling "less depressed" on Latuda, denies side effects. She reports not feeling suicidal anymore. She reports her sleep is "very good" with Trazodone. She has been participating in groups, and interacting with peers appropriately. She denies any physical complaints. She denies auditory/visual hallucinations and reports feeling safe here in the hospital. No current symptoms of withdrawal, no cravings to use substances on current dose of Suboxone. She continues to desire inpatient, long-term substance abuse treatment vs. Intensive outpatient substance abuse treatment. She states with her current level of depression and anxiety she does not believe she would do well with current level of outpatient treatment. Referrals sent by social workers on Friday. She is not eligible for [facility], no reply from [facility]. A family member of hers attended [facility] in [place] in the past, and patient was interested in program. Number provided for patient to call. She was also looking in [facility], and this number was provided as well. Patient had no new complaints aside from pain due to chipped tooth. Will order orajel today.   Labs: no new results for review.  Total Time spent with patient: 30  minutes  Past Psychiatric History: Threeprevious hospital admission, most recently in October 2021. History of court ordered medications during pregnancy. 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 She states she has been on numerous medications in the past, but can only recall a few. Specifically, she has been on Wellbutrin which induced hyperactivity and mania, Prozac which was not helpful, Seroquel which made her feel tired, Buspar ineffective at max dose, Abilify ineffective for mood stabilization and depression in the past.Lithium caused her to "feel like a zombie"   Past Medical History:  Past Medical History:  Diagnosis Date  . Anxiety   . Bipolar affective (HCC)   . Depression   . Herpes simplex type II infection   . Substance abuse [facility](HCC)     Past Surgical History:  Procedure Laterality Date  . NO PAST SURGERIES    . WISDOM TOOTH EXTRACTION     Family History:  Family History  Problem Relation Age of Onset  . Arthritis Maternal Grandmother   . Diabetes Maternal Grandmother   . Diabetes Maternal Grandfather   . Obesity Paternal Grandmother    Family Psychiatric  History: Mother and father with depression  Social History:  Social History   Substance and Sexual Activity  Alcohol Use Not Currently     Social History   Substance and Sexual Activity  Drug Use Not Currently  . Types: Methamphetamines, Other-see comments, Marijuana, "Crack" cocaine   Comment: suboxone daily    Social History   Socioeconomic History  . Marital status: Single    Spouse name: Not on file  . Number of children: Not on file  . Years of education: Not on file  .  Highest education level: Not on file  Occupational History  . Not on file  Tobacco Use  . Smoking status: Current Every Day Smoker    Packs/day: 0.25    Types: Cigarettes  . Smokeless tobacco: Never Used  . Tobacco comment: 4 cigs per day  Vaping Use  . Vaping Use: Never used  Substance and Sexual  Activity  . Alcohol use: Not Currently  . Drug use: Not Currently    Types: Methamphetamines, Other-see comments, Marijuana, "Crack" cocaine    Comment: suboxone daily  . Sexual activity: Not Currently    Birth control/protection: None  Other Topics Concern  . Not on file  Social History Narrative  . Not on file   Social Determinants of Health   Financial Resource Strain:   . Difficulty of Paying Living Expenses: Not on file  Food Insecurity:   . Worried About Programme researcher, broadcasting/film/video in the Last Year: Not on file  . Ran Out of Food in the Last Year: Not on file  Transportation Needs:   . Lack of Transportation (Medical): Not on file  . Lack of Transportation (Non-Medical): Not on file  Physical Activity:   . Days of Exercise per Week: Not on file  . Minutes of Exercise per Session: Not on file  Stress:   . Feeling of Stress : Not on file  Social Connections:   . Frequency of Communication with Friends and Family: Not on file  . Frequency of Social Gatherings with Friends and Family: Not on file  . Attends Religious Services: Not on file  . Active Member of Clubs or Organizations: Not on file  . Attends Banker Meetings: Not on file  . Marital Status: Not on file   Additional Social History:                         Sleep: Good  Appetite:  Good  Current Medications: Current Facility-Administered Medications  Medication Dose Route Frequency Provider Last Rate Last Admin  . acetaminophen (TYLENOL) tablet 650 mg  650 mg Oral Q4H PRN Clapacs, Jackquline Denmark, MD   650 mg at 11/28/19 1115  . alum & mag hydroxide-simeth (MAALOX/MYLANTA) 200-200-20 MG/5ML suspension 30 mL  30 mL Oral Q6H PRN Clapacs, John T, MD      . buprenorphine-naloxone (SUBOXONE) 2-0.5 mg per SL tablet 1 tablet  1 tablet Sublingual Daily Clapacs, Jackquline Denmark, MD   1 tablet at 11/28/19 0759  . clonazePAM (KLONOPIN) tablet 1 mg  1 mg Oral BID Clapacs, Jackquline Denmark, MD   1 mg at 11/28/19 0759  . feeding  supplement (ENSURE ENLIVE / ENSURE PLUS) liquid 237 mL  237 mL Oral TID BM Jesse Sans, MD   237 mL at 11/28/19 1030  . hydrOXYzine (ATARAX/VISTARIL) tablet 25 mg  25 mg Oral TID PRN Jesse Sans, MD   25 mg at 11/28/19 1115  . lurasidone (LATUDA) tablet 40 mg  40 mg Oral Q supper Jesse Sans, MD   40 mg at 11/27/19 1640  . magnesium hydroxide (MILK OF MAGNESIA) suspension 30 mL  30 mL Oral Daily PRN Clapacs, Jackquline Denmark, MD   30 mL at 11/27/19 0854  . nicotine (NICODERM CQ - dosed in mg/24 hours) patch 21 mg  21 mg Transdermal Daily Clapacs, John T, MD   21 mg at 11/28/19 0800  . ondansetron (ZOFRAN) tablet 4 mg  4 mg Oral Q8H PRN Clapacs, John T,  MD      . traZODone (DESYREL) tablet 150 mg  150 mg Oral QHS PRN Gillermo Murdoch, NP   150 mg at 11/27/19 2112    Lab Results: No results found for this or any previous visit (from the past 48 hour(s)).  Blood Alcohol level:  Lab Results  Component Value Date   ETH 19 (H) 11/23/2019   ETH <10 10/14/2019    Metabolic Disorder Labs: No results found for: HGBA1C, MPG No results found for: PROLACTIN No results found for: CHOL, TRIG, HDL, CHOLHDL, VLDL, LDLCALC  Physical Findings: AIMS: Facial and Oral Movements Muscles of Facial Expression: None, normal Lips and Perioral Area: None, normal Jaw: None, normal Tongue: None, normal,Extremity Movements Upper (arms, wrists, hands, fingers): None, normal Lower (legs, knees, ankles, toes): None, normal, Trunk Movements Neck, shoulders, hips: None, normal, Overall Severity Severity of abnormal movements (highest score from questions above): None, normal Incapacitation due to abnormal movements: None, normal Patient's awareness of abnormal movements (rate only patient's report): No Awareness, Dental Status Current problems with teeth and/or dentures?: No Does patient usually wear dentures?: No  CIWA:  CIWA-Ar Total: 3 COWS:  COWS Total Score: 2  Musculoskeletal: Strength & Muscle  Tone: within normal limits Gait & Station: normal Patient leans: Right  Psychiatric Specialty Exam: Physical Exam Vitals and nursing note reviewed.  Constitutional:      Appearance: Normal appearance.  HENT:     Head: Normocephalic and atraumatic.     Right Ear: External ear normal.     Left Ear: External ear normal.     Nose: Nose normal.     Mouth/Throat:     Mouth: Mucous membranes are moist.     Pharynx: Oropharynx is clear.  Eyes:     Extraocular Movements: Extraocular movements intact.     Conjunctiva/sclera: Conjunctivae normal.     Pupils: Pupils are equal, round, and reactive to light.  Cardiovascular:     Rate and Rhythm: Normal rate.     Pulses: Normal pulses.  Pulmonary:     Effort: Pulmonary effort is normal.     Breath sounds: Normal breath sounds.  Abdominal:     General: Abdomen is flat.     Palpations: Abdomen is soft.  Musculoskeletal:        General: No swelling. Normal range of motion.     Cervical back: Normal range of motion and neck supple.  Skin:    General: Skin is warm and dry.  Neurological:     General: No focal deficit present.     Mental Status: She is alert and oriented to person, place, and time.  Psychiatric:        Attention and Perception: Attention and perception normal.        Mood and Affect: Affect normal. Mood is anxious.        Speech: Speech normal.        Behavior: Behavior is cooperative.        Thought Content: Thought content does not include homicidal or suicidal ideation.        Cognition and Memory: Cognition and memory normal.        Judgment: Judgment is impulsive.     Review of Systems  Constitutional: Positive for appetite change. Negative for fatigue.  HENT: Negative for rhinorrhea and sore throat.   Eyes: Negative for photophobia and visual disturbance.  Respiratory: Negative for cough and shortness of breath.   Cardiovascular: Negative for chest pain and palpitations.  Gastrointestinal: Negative  for  constipation, diarrhea, nausea and vomiting.  Endocrine: Negative for cold intolerance and heat intolerance.  Genitourinary: Negative for difficulty urinating and dysuria.  Musculoskeletal: Negative for arthralgias and myalgias.  Skin: Negative for rash and wound.  Allergic/Immunologic: Negative for environmental allergies and food allergies.  Neurological: Negative for dizziness and headaches.  Hematological: Negative for adenopathy. Does not bruise/bleed easily.  Psychiatric/Behavioral: Positive for dysphoric mood. Negative for hallucinations and suicidal ideas. The patient is nervous/anxious.     Blood pressure 106/74, pulse 80, temperature 97.8 F (36.6 C), temperature source Oral, resp. rate 17, height 5\' 7"  (1.702 m), weight 58.1 kg, last menstrual period 11/18/2019, SpO2 100 %, currently breastfeeding.Body mass index is 20.05 kg/m.  General Appearance: Casual  Eye Contact:  Good  Speech:  Normal Rate  Volume:  Normal  Mood:  "better, less depressed"  Affect:  Appropriate, Congruent and Constricted  Thought Process:  Coherent and Goal Directed  Orientation:  Full (Time, Place, and Person)  Thought Content:  Logical  Suicidal Thoughts:  No  Homicidal Thoughts:  No  Memory:  Immediate;   Fair Recent;   Fair Remote;   Fair  Judgement:  Fair  Insight:  Fair  Psychomotor Activity:  Normal  Concentration:  Concentration: Fair and Attention Span: Fair  Recall:  13/05/2019 of Knowledge:  Fair  Language:  Fair  Akathisia:  No  Handed:  Right  AIMS (if indicated):     Assets:  Communication Skills Desire for Improvement Financial Resources/Insurance Housing Intimacy Resilience Social Support  ADL's:  Intact  Cognition:  WNL  Sleep:  Number of Hours: 8     Treatment Plan Summary: Daily contact with patient to assess and evaluate symptoms and progress in treatment and Medication management   Patient is a 34 year old female with the above-stated past psychiatric history  who is seen in follow-up.  Chart reviewed. Patient discussed with nursing. Patient reports partial mood improvement - she is less depressed, she denies suicidal thoughts today. She reports good sleep, improved appetite. She appears brighter, not tearful, visible in the milieu, participates in groups. Current medication (Latuda) was started four days ago and seems to be effective. Anxiety is well-managed by Klonopin and opioid addiction is managed by Suboxone. No med changes made today.   Plan:  -continue inpatient psych admission; 15-minute checks; daily contact with patient to assess and evaluate symptoms and progress in treatment; psychoeducation.  -continue scheduled medications: . buprenorphine-naloxone  1 tablet Sublingual Daily  . clonazePAM  1 mg Oral BID  . feeding supplement  237 mL Oral TID BM  . lurasidone  40 mg Oral Q supper  . nicotine  21 mg Transdermal Daily    -continue PRN medications.  acetaminophen, alum & mag hydroxide-simeth, hydrOXYzine, magnesium hydroxide, ondansetron, traZODone  -Pertinent Labs: no new labs ordered today   -Disposition: Estimated duration of hospitalization: Likely late this week. All necessary aftercare will be arranged prior to discharge. Patient desires inpatient substance abuse treatment vs [facility] at this time. Referall to [facility] pending. Numbers provided to [facility] and [facility] today.   -  I certify that the patient does need, on a daily basis, active treatment furnished directly by or requiring the supervision of inpatient psychiatric facility personnel.   [facility], MD 11/28/2019, 12:13 PM

## 2019-11-28 NOTE — Progress Notes (Signed)
Patient was given belongings that were dropped off by a visitor.

## 2019-11-28 NOTE — [facility] Group Notes (Signed)
LCSW Group Therapy Note   11/28/2019 12:41 PM  Type of Therapy and Topic:  Group Therapy:  Overcoming Obstacles   Participation Level:  Active   Description of Group:    In this group patients will be encouraged to explore what they see as obstacles to their own wellness and recovery. They will be guided to discuss their thoughts, feelings, and behaviors related to these obstacles. The group will process together ways to cope with barriers, with attention given to specific choices patients can make. Each patient will be challenged to identify changes they are motivated to make in order to overcome their obstacles. This group will be process-oriented, with patients participating in exploration of their own experiences as well as giving and receiving support and challenge from other group members.   Therapeutic Goals: 1. Patient will identify personal and current obstacles as they relate to admission. 2. Patient will identify barriers that currently interfere with their wellness or overcoming obstacles.  3. Patient will identify feelings, thought process and behaviors related to these barriers. 4. Patient will identify two changes they are willing to make to overcome these obstacles:      Summary of Patient Progress Patient was present in group. Patient was an active participant. Patient shared how her obstacle has been finding where to "go from here".  Patient shared that she also has struggled with identifying the correct medications. She shared plans to be more open to finding out about medications and trying them.  She also identified finances as a barriers. She was able to identify the need to develop a budget to address her financial needs.     Therapeutic Modalities:   Cognitive Behavioral Therapy Solution Focused Therapy Motivational Interviewing Relapse Prevention Therapy  [name], MSW, LCSW 11/28/2019 12:41 PM

## 2019-11-29 NOTE — Progress Notes (Signed)
[facility] MD Progress Note  11/29/2019 10:05 AM Marcia West  MRN:  [id number]   Principal Problem: Bipolar depression (HCC) Diagnosis: Principal Problem:   Bipolar depression (HCC) Active Problems:   Opioid use disorder   Insomnia secondary to anxiety   Alcohol abuse   Cocaine abuse (HCC)  34 year old female with bipolar I disorder presented to emergency room voluntarily for worsening depression and suicidal ideation without plan.   Interval History Patient was seen today for re-evaluation.  Nursing reports no events overnight. The patient has no issues with performing ADLs.  Patient has been medication compliant.    Subjective:  On assessment patient reports "I feel better, but still not ready to leave". Reports feeling "less depressed" on Latuda, denies side effects. She reports not feeling suicidal anymore. She reports her sleep is "very good" with Trazodone. She has been participating in groups, and interacting with peers appropriately. She denies any physical complaints. She denies auditory/visual hallucinations and reports feeling safe here in the hospital. No current symptoms of withdrawal, no cravings to use substances on current dose of Suboxone. She continues to desire inpatient, long-term substance abuse treatment vs. Intensive outpatient substance abuse treatment. She states with her current level of depression and anxiety she does not believe she would do well with current level of outpatient treatment. Referrals sent by social workers on Friday. She is not eligible for [facility], no reply from [facility]. She reached out to [facility] on her own behalf, but no beds available until Nov 29th. She is also reaching out to [facility] in [place], and [employer] [facility] program in [place].  Patient also notes increase number of "floaters" in her eyes. She denies feeling of black curtain dropping over her eye, blurry vision, or decreased vision that would be suggestive of retinal detachment. BP WNL  at 101/69. She states she has had "floaters" for quite some time now, and they do not bother her. Will continue to monitor and recommend eye exam at discharge.   Labs: no new results for review.  Total Time spent with patient: 30 minutes  Past Psychiatric History: Threeprevious hospital admission, most recently in October 2021. History of court ordered medications during pregnancy. 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 She states she has been on numerous medications in the past, but can only recall a few. Specifically, she has been on Wellbutrin which induced hyperactivity and mania, Prozac which was not helpful, Seroquel which made her feel tired, Buspar ineffective at max dose, Abilify ineffective for mood stabilization and depression in the past.Lithium caused her to "feel like a zombie"   Past Medical History:  Past Medical History:  Diagnosis Date  . Anxiety   . Bipolar affective (HCC)   . Depression   . Herpes simplex type II infection   . Substance abuse [facility])     Past Surgical History:  Procedure Laterality Date  . NO PAST SURGERIES    . WISDOM TOOTH EXTRACTION     Family History:  Family History  Problem Relation Age of Onset  . Arthritis Maternal Grandmother   . Diabetes Maternal Grandmother   . Diabetes Maternal Grandfather   . Obesity Paternal Grandmother    Family Psychiatric  History: Mother and father with depression  Social History:  Social History   Substance and Sexual Activity  Alcohol Use Not Currently     Social History   Substance and Sexual Activity  Drug Use Not Currently  . Types: Methamphetamines, Other-see comments, Marijuana, "Crack" cocaine  Comment: suboxone daily    Social History   Socioeconomic History  . Marital status: Single    Spouse name: Not on file  . Number of children: Not on file  . Years of education: Not on file  . Highest education level: Not on file  Occupational History  . Not on file   Tobacco Use  . Smoking status: Current Every Day Smoker    Packs/day: 0.25    Types: Cigarettes  . Smokeless tobacco: Never Used  . Tobacco comment: 4 cigs per day  Vaping Use  . Vaping Use: Never used  Substance and Sexual Activity  . Alcohol use: Not Currently  . Drug use: Not Currently    Types: Methamphetamines, Other-see comments, Marijuana, "Crack" cocaine    Comment: suboxone daily  . Sexual activity: Not Currently    Birth control/protection: None  Other Topics Concern  . Not on file  Social History Narrative  . Not on file   Social Determinants of Health   Financial Resource Strain:   . Difficulty of Paying Living Expenses: Not on file  Food Insecurity:   . Worried About Programme researcher, broadcasting/film/video in the Last Year: Not on file  . Ran Out of Food in the Last Year: Not on file  Transportation Needs:   . Lack of Transportation (Medical): Not on file  . Lack of Transportation (Non-Medical): Not on file  Physical Activity:   . Days of Exercise per Week: Not on file  . Minutes of Exercise per Session: Not on file  Stress:   . Feeling of Stress : Not on file  Social Connections:   . Frequency of Communication with Friends and Family: Not on file  . Frequency of Social Gatherings with Friends and Family: Not on file  . Attends Religious Services: Not on file  . Active Member of Clubs or Organizations: Not on file  . Attends Banker Meetings: Not on file  . Marital Status: Not on file   Additional Social History:                         Sleep: Good  Appetite:  Good  Current Medications: Current Facility-Administered Medications  Medication Dose Route Frequency Provider Last Rate Last Admin  . acetaminophen (TYLENOL) tablet 650 mg  650 mg Oral Q4H PRN Clapacs, Jackquline Denmark, MD   650 mg at 11/29/19 0754  . alum & mag hydroxide-simeth (MAALOX/MYLANTA) 200-200-20 MG/5ML suspension 30 mL  30 mL Oral Q6H PRN Clapacs, John T, MD      . benzocaine (ORAJEL)  10 % mucosal gel   Mouth/Throat TID PRN Jesse Sans, MD   Given at 11/29/19 [phone]  . buprenorphine-naloxone (SUBOXONE) 2-0.5 mg per SL tablet 1 tablet  1 tablet Sublingual Daily Clapacs, Jackquline Denmark, MD   1 tablet at 11/29/19 0752  . clonazePAM (KLONOPIN) tablet 1 mg  1 mg Oral BID Clapacs, Jackquline Denmark, MD   1 mg at 11/29/19 0752  . feeding supplement (ENSURE ENLIVE / ENSURE PLUS) liquid 237 mL  237 mL Oral TID BM Jesse Sans, MD   237 mL at 11/29/19 0949  . hydrOXYzine (ATARAX/VISTARIL) tablet 25 mg  25 mg Oral TID PRN Jesse Sans, MD   25 mg at 11/28/19 2118  . lurasidone (LATUDA) tablet 40 mg  40 mg Oral Q supper Jesse Sans, MD   40 mg at 11/28/19 1650  . magnesium hydroxide (MILK OF  MAGNESIA) suspension 30 mL  30 mL Oral Daily PRN Clapacs, Jackquline Denmark, MD   30 mL at 11/27/19 0854  . nicotine (NICODERM CQ - dosed in mg/24 hours) patch 21 mg  21 mg Transdermal Daily Clapacs, Jackquline Denmark, MD   21 mg at 11/29/19 0752  . ondansetron (ZOFRAN) tablet 4 mg  4 mg Oral Q8H PRN Clapacs, John T, MD      . traZODone (DESYREL) tablet 150 mg  150 mg Oral QHS PRN Gillermo Murdoch, NP   150 mg at 11/28/19 2118    Lab Results: No results found for this or any previous visit (from the past 48 hour(s)).  Blood Alcohol level:  Lab Results  Component Value Date   ETH 19 (H) 11/23/2019   ETH <10 10/14/2019    Metabolic Disorder Labs: No results found for: HGBA1C, MPG No results found for: PROLACTIN No results found for: CHOL, TRIG, HDL, CHOLHDL, VLDL, LDLCALC  Physical Findings: AIMS: Facial and Oral Movements Muscles of Facial Expression: None, normal Lips and Perioral Area: None, normal Jaw: None, normal Tongue: None, normal,Extremity Movements Upper (arms, wrists, hands, fingers): None, normal Lower (legs, knees, ankles, toes): None, normal, Trunk Movements Neck, shoulders, hips: None, normal, Overall Severity Severity of abnormal movements (highest score from questions above): None,  normal Incapacitation due to abnormal movements: None, normal Patient's awareness of abnormal movements (rate only patient's report): No Awareness, Dental Status Current problems with teeth and/or dentures?: No Does patient usually wear dentures?: No  CIWA:  CIWA-Ar Total: 3 COWS:  COWS Total Score: 2  Musculoskeletal: Strength & Muscle Tone: within normal limits Gait & Station: normal Patient leans: Right  Psychiatric Specialty Exam: Physical Exam Vitals and nursing note reviewed.  Constitutional:      Appearance: Normal appearance.  HENT:     Head: Normocephalic and atraumatic.     Right Ear: External ear normal.     Left Ear: External ear normal.     Nose: Nose normal.     Mouth/Throat:     Mouth: Mucous membranes are moist.     Pharynx: Oropharynx is clear.  Eyes:     Extraocular Movements: Extraocular movements intact.     Conjunctiva/sclera: Conjunctivae normal.     Pupils: Pupils are equal, round, and reactive to light.  Cardiovascular:     Rate and Rhythm: Normal rate.     Pulses: Normal pulses.  Pulmonary:     Effort: Pulmonary effort is normal.     Breath sounds: Normal breath sounds.  Abdominal:     General: Abdomen is flat.     Palpations: Abdomen is soft.  Musculoskeletal:        General: No swelling. Normal range of motion.     Cervical back: Normal range of motion and neck supple.  Skin:    General: Skin is warm and dry.  Neurological:     General: No focal deficit present.     Mental Status: She is alert and oriented to person, place, and time.  Psychiatric:        Attention and Perception: Attention and perception normal.        Mood and Affect: Affect normal. Mood is anxious.        Speech: Speech normal.        Behavior: Behavior is cooperative.        Thought Content: Thought content does not include homicidal or suicidal ideation.        Cognition and Memory: Cognition and memory  normal.        Judgment: Judgment is impulsive.     Review  of Systems  Constitutional: Positive for appetite change. Negative for fatigue.  HENT: Negative for rhinorrhea and sore throat.   Eyes: Negative for photophobia and visual disturbance.  Respiratory: Negative for cough and shortness of breath.   Cardiovascular: Negative for chest pain and palpitations.  Gastrointestinal: Negative for constipation, diarrhea, nausea and vomiting.  Endocrine: Negative for cold intolerance and heat intolerance.  Genitourinary: Negative for difficulty urinating and dysuria.  Musculoskeletal: Negative for arthralgias and myalgias.  Skin: Negative for rash and wound.  Allergic/Immunologic: Negative for environmental allergies and food allergies.  Neurological: Negative for dizziness and headaches.  Hematological: Negative for adenopathy. Does not bruise/bleed easily.  Psychiatric/Behavioral: Positive for dysphoric mood. Negative for hallucinations and suicidal ideas. The patient is nervous/anxious.     Blood pressure 101/68, pulse 86, temperature 97.9 F (36.6 C), temperature source Oral, resp. rate 18, height 5\' 7"  (1.702 m), weight 58.1 kg, last menstrual period 11/18/2019, SpO2 100 %, currently breastfeeding.Body mass index is 20.05 kg/m.  General Appearance: Casual  Eye Contact:  Good  Speech:  Normal Rate  Volume:  Normal  Mood:  "better, less depressed"  Affect:  Appropriate, Congruent and Constricted  Thought Process:  Coherent and Goal Directed  Orientation:  Full (Time, Place, and Person)  Thought Content:  Logical  Suicidal Thoughts:  No  Homicidal Thoughts:  No  Memory:  Immediate;   Fair Recent;   Fair Remote;   Fair  Judgement:  Fair  Insight:  Fair  Psychomotor Activity:  Normal  Concentration:  Concentration: Fair and Attention Span: Fair  Recall:  13/05/2019 of Knowledge:  Fair  Language:  Fair  Akathisia:  No  Handed:  Right  AIMS (if indicated):     Assets:  Communication Skills Desire for Improvement Financial  Resources/Insurance Housing Intimacy Resilience Social Support  ADL's:  Intact  Cognition:  WNL  Sleep:  Number of Hours: 8.75     Treatment Plan Summary: Daily contact with patient to assess and evaluate symptoms and progress in treatment and Medication management   Patient is a 34 year old female with the above-stated past psychiatric history who is seen in follow-up.  Chart reviewed. Patient discussed with nursing. Patient reports partial mood improvement - she is less depressed, she denies suicidal thoughts today. She reports good sleep, improved appetite. She appears brighter, not tearful, visible in the milieu, participates in groups. Current medication (Latuda) was started four days ago and seems to be effective. Anxiety is well-managed by Klonopin and opioid addiction is managed by Suboxone. No med changes made today.   Plan:  -continue inpatient psych admission; 15-minute checks; daily contact with patient to assess and evaluate symptoms and progress in treatment; psychoeducation.  -continue scheduled medications: . buprenorphine-naloxone  1 tablet Sublingual Daily  . clonazePAM  1 mg Oral BID  . feeding supplement  237 mL Oral TID BM  . lurasidone  40 mg Oral Q supper  . nicotine  21 mg Transdermal Daily    -continue PRN medications.  acetaminophen, alum & mag hydroxide-simeth, benzocaine, hydrOXYzine, magnesium hydroxide, ondansetron, traZODone  -Pertinent Labs: no new labs ordered today   -Disposition: Estimated duration of hospitalization: Likely late this week. All necessary aftercare will be arranged prior to discharge. Patient desires inpatient substance abuse treatment vs SAIOP at this time. Referall to [facility] pending. Patient also calling [facility] and [facility].   -  I certify that the patient does need, on a daily basis, active treatment furnished directly by or requiring the supervision of inpatient psychiatric facility personnel.    Jesse SansMegan M Hania Cerone, MD 11/29/2019, 10:05 AM

## 2019-11-29 NOTE — Progress Notes (Signed)
D: Pt alert and oriented. Pt rates depression 9/10, hopelessness 10/10, and anxiety 2/10. Pt goal: "dentist." Pt reports energy level as low and concentration as being poor. Pt reports sleep last night as being fair. Pt did receive medications for sleep and did find them helpful. Pt reports experiencing 6/10 tooth pain, prn meds given. Pt denies experiencing any SI/HI, or AVH at this time.   A: Scheduled medications administered to pt, per MD orders. Support and encouragement provided. Frequent verbal contact made. Routine safety checks conducted q15 minutes.   R: No adverse drug reactions noted. Pt verbally contracts for safety at this time. Pt complaint with medications and treatment plan. Pt interacts well with others on the unit. Pt remains safe at this time. Will continue to monitor.

## 2019-11-29 NOTE — Progress Notes (Addendum)
Recreation Therapy Notes  Date: 11/29/2019  Time: 9:30 am   Location: Craft room  Behavioral response: Appropriate  Intervention Topic: Happiness    Discussion/Intervention:  Group content today was focused on Happiness. The group defined happiness and described where happiness comes from. Individuals identified what makes them happy and how they go about making others happy. Patients expressed things that stop them from being happy and ways they can improve their happiness. The group stated reasons why it is important to be happy. The group participated in the intervention "My Happiness", where they had a chance to identify and express things that make them happy. Clinical Observations/Feedback: Patient came to group and defined happiness as feeling good. She stated that time with her kids and self-care makes her happy. Participant explained that happiness is important to get things done. Individual was social with peers and staff while participating in the intervention.  Clarnce Homan LRT/CTRS          Dustine Bertini 11/29/2019 12:39 PM

## 2019-11-29 NOTE — Progress Notes (Signed)
Patient is pleasant and easy to engage. She denies SI  HI  AVH depression and anxiety at this encounter. She states that she is well and ready to leave. She reports being disappointed that she has not been able to find long term residential treatment, but has found a shelter with openings and wants to go there until a bed becomes available at a treatment center.  She is med compliant and tolerated her med without incident.  She is active on the unit and gets along well with her peers.  She is safe on the unit with 15 minute safety checks and was encouraged to contact staff with any concerns.    Cleo Butler-Nicholson, LPN

## 2019-11-29 NOTE — [facility] Group Notes (Addendum)
LCSW Group Therapy Note  11/29/2019 2:10 PM  Type of Therapy/Topic:  Group Therapy:  Feelings about Diagnosis  Participation Level:  Active   Description of Group:   This group will allow patients to explore their thoughts and feelings about diagnoses they have received. Patients will be guided to explore their level of understanding and acceptance of these diagnoses. Facilitator will encourage patients to process their thoughts and feelings about the reactions of others to their diagnosis and will guide patients in identifying ways to discuss their diagnosis with significant others in their lives. This group will be process-oriented, with patients participating in exploration of their own experiences, giving and receiving support, and processing challenge from other group members.   Therapeutic Goals: 1. Patient will demonstrate understanding of diagnosis as evidenced by identifying two or more symptoms of the disorder 2. Patient will be able to express two feelings regarding the diagnosis 3. Patient will demonstrate their ability to communicate their needs through discussion and/or role play  Summary of Patient Progress: Patient was active and engaged in the conversation. She was able to identify her negative feelings around her diagnosis and feelings that it was not correct. Patient talked about mania and depression and feeling that only one side was being addressed. Patient talked with the group about her own situation with her mother. Patient was accepting of suggestions of letting mother know what she wants/needs from her support system or about family support groups. She even took notes during this part of the discussion.    Therapeutic Modalities:   Cognitive Behavioral Therapy Brief Therapy Feelings Identification   Zayed Griffie R. Algis Greenhouse, MSW, LCSW, LCAS 11/29/2019 2:10 PM

## 2019-11-29 NOTE — Plan of Care (Signed)
  Problem: Education: Goal: Knowledge of [facility] Education information/materials will improve Outcome: Progressing Goal: Emotional status will improve Outcome: Progressing Goal: Mental status will improve Outcome: Progressing Goal: Verbalization of understanding the information provided will improve Outcome: Progressing   Problem: Activity: Goal: Interest or engagement in activities will improve Outcome: Progressing Goal: Sleeping patterns will improve Outcome: Progressing   Problem: Coping: Goal: Ability to verbalize frustrations and anger appropriately will improve Outcome: Progressing Goal: Ability to demonstrate self-control will improve Outcome: Progressing   Problem: Health Behavior/Discharge Planning: Goal: Identification of resources available to assist in meeting health care needs will improve Outcome: Progressing Goal: Compliance with treatment plan for underlying cause of condition will improve Outcome: Progressing   Problem: Physical Regulation: Goal: Ability to maintain clinical measurements within normal limits will improve Outcome: Progressing   Problem: Safety: Goal: Periods of time without injury will increase Outcome: Progressing   Problem: Education: Goal: Knowledge of disease or condition will improve Outcome: Progressing Goal: Understanding of discharge needs will improve Outcome: Progressing   Problem: Health Behavior/Discharge Planning: Goal: Ability to identify changes in lifestyle to reduce recurrence of condition will improve Outcome: Progressing Goal: Identification of resources available to assist in meeting health care needs will improve Outcome: Progressing   Problem: Physical Regulation: Goal: Complications related to the disease process, condition or treatment will be avoided or minimized Outcome: Progressing   Problem: Safety: Goal: Ability to remain free from injury will improve Outcome: Progressing   

## 2019-11-29 NOTE — [facility] Counselor (Signed)
CSW faxed referral to [facility]. CSW received confirmation the fax was successful.  [name], MSW, LCSW 11/29/2019 3:32 PM

## 2019-11-30 LAB — GLUCOSE, CAPILLARY: Glucose-Capillary: 102 mg/dL — ABNORMAL HIGH (ref 70–99)

## 2019-11-30 MED ORDER — BUPRENORPHINE HCL-NALOXONE HCL 2-0.5 MG SL SUBL
1.0000 | SUBLINGUAL_TABLET | Freq: Every day | SUBLINGUAL | Status: DC
  Administered 2019-12-01: 1 via SUBLINGUAL
  Filled 2019-11-30: qty 1

## 2019-11-30 NOTE — Tx Team (Addendum)
Interdisciplinary Treatment and Diagnostic Plan Update  11/30/2019 Time of Session: 08:30 Marcia West MRN: [id number]  Principal Diagnosis: Bipolar depression (HCC)  Secondary Diagnoses: Principal Problem:   Bipolar depression (HCC) Active Problems:   Opioid use disorder   Insomnia secondary to anxiety   Alcohol abuse   Cocaine abuse (HCC)   Current Medications:  Current Facility-Administered Medications  Medication Dose Route Frequency Provider Last Rate Last Admin  . acetaminophen (TYLENOL) tablet 650 mg  650 mg Oral Q4H PRN Clapacs, Jackquline Denmark, MD   650 mg at 11/30/19 0808  . alum & mag hydroxide-simeth (MAALOX/MYLANTA) 200-200-20 MG/5ML suspension 30 mL  30 mL Oral Q6H PRN Clapacs, John T, MD      . benzocaine (ORAJEL) 10 % mucosal gel   Mouth/Throat TID PRN Jesse Sans, MD   Given at 11/30/19 [phone]  . buprenorphine-naloxone (SUBOXONE) 2-0.5 mg per SL tablet 1 tablet  1 tablet Sublingual Daily Clapacs, Jackquline Denmark, MD   1 tablet at 11/30/19 [phone]  . clonazePAM (KLONOPIN) tablet 1 mg  1 mg Oral BID Clapacs, Jackquline Denmark, MD   1 mg at 11/30/19 [phone]  . feeding supplement (ENSURE ENLIVE / ENSURE PLUS) liquid 237 mL  237 mL Oral TID BM Jesse Sans, MD   237 mL at 11/29/19 2100  . hydrOXYzine (ATARAX/VISTARIL) tablet 25 mg  25 mg Oral TID PRN Jesse Sans, MD   25 mg at 11/29/19 2105  . lurasidone (LATUDA) tablet 40 mg  40 mg Oral Q supper Jesse Sans, MD   40 mg at 11/29/19 1711  . magnesium hydroxide (MILK OF MAGNESIA) suspension 30 mL  30 mL Oral Daily PRN Clapacs, Jackquline Denmark, MD   30 mL at 11/27/19 0854  . nicotine (NICODERM CQ - dosed in mg/24 hours) patch 21 mg  21 mg Transdermal Daily Clapacs, Jackquline Denmark, MD   21 mg at 11/30/19 0809  . ondansetron (ZOFRAN) tablet 4 mg  4 mg Oral Q8H PRN Clapacs, John T, MD      . traZODone (DESYREL) tablet 150 mg  150 mg Oral QHS PRN Gillermo Murdoch, NP   150 mg at 11/29/19 2104   PTA Medications: Medications Prior to Admission  Medication  Sig Dispense Refill Last Dose  . ARIPiprazole (ABILIFY) 5 MG tablet Take 1 tablet (5 mg total) by mouth daily. 30 tablet 1   . Buprenorphine HCl-Naloxone HCl 4-1 MG FILM Take 1/2 film daily SL. 7 each 0   . clonazePAM (KLONOPIN) 1 MG tablet Take 1 tablet (1 mg total) by mouth 2 (two) times daily. 30 tablet 1   . lithium carbonate (ESKALITH) 450 MG CR tablet Take 1 tablet (450 mg total) by mouth every 12 (twelve) hours. 60 tablet 1   . nicotine (NICODERM CQ - DOSED IN MG/24 HOURS) 21 mg/24hr patch Place 1 patch (21 mg total) onto the skin daily. 28 patch 0   . traZODone (DESYREL) 150 MG tablet Take 1 tablet (150 mg total) by mouth at bedtime as needed for sleep. 30 tablet 1     Patient Stressors:    Patient Strengths:    Treatment Modalities: Medication Management, Group therapy, Case management,  1 to 1 session with clinician, Psychoeducation, Recreational therapy.   Physician Treatment Plan for Primary Diagnosis: Bipolar depression (HCC) Long Term Goal(s): Improvement in symptoms so as ready for discharge Improvement in symptoms so as ready for discharge   Short Term Goals: Ability to identify changes in lifestyle to reduce recurrence  of condition will improve Ability to verbalize feelings will improve Ability to disclose and discuss suicidal ideas Ability to demonstrate self-control will improve Ability to identify and develop effective coping behaviors will improve Compliance with prescribed medications will improve Ability to identify triggers associated with substance abuse/mental health issues will improve Ability to identify changes in lifestyle to reduce recurrence of condition will improve Ability to verbalize feelings will improve Ability to disclose and discuss suicidal ideas Ability to demonstrate self-control will improve Ability to identify and develop effective coping behaviors will improve Ability to maintain clinical measurements within normal limits will  improve Ability to identify triggers associated with substance abuse/mental health issues will improve  Medication Management: Evaluate patient's response, side effects, and tolerance of medication regimen.  Therapeutic Interventions: 1 to 1 sessions, Unit Group sessions and Medication administration.  Evaluation of Outcomes: Progressing  Physician Treatment Plan for Secondary Diagnosis: Principal Problem:   Bipolar depression (HCC) Active Problems:   Opioid use disorder   Insomnia secondary to anxiety   Alcohol abuse   Cocaine abuse (HCC)  Long Term Goal(s): Improvement in symptoms so as ready for discharge Improvement in symptoms so as ready for discharge   Short Term Goals: Ability to identify changes in lifestyle to reduce recurrence of condition will improve Ability to verbalize feelings will improve Ability to disclose and discuss suicidal ideas Ability to demonstrate self-control will improve Ability to identify and develop effective coping behaviors will improve Compliance with prescribed medications will improve Ability to identify triggers associated with substance abuse/mental health issues will improve Ability to identify changes in lifestyle to reduce recurrence of condition will improve Ability to verbalize feelings will improve Ability to disclose and discuss suicidal ideas Ability to demonstrate self-control will improve Ability to identify and develop effective coping behaviors will improve Ability to maintain clinical measurements within normal limits will improve Ability to identify triggers associated with substance abuse/mental health issues will improve     Medication Management: Evaluate patient's response, side effects, and tolerance of medication regimen.  Therapeutic Interventions: 1 to 1 sessions, Unit Group sessions and Medication administration.  Evaluation of Outcomes: Progressing   RN Treatment Plan for Primary Diagnosis: Bipolar depression  (HCC) Long Term Goal(s): Knowledge of disease and therapeutic regimen to maintain health will improve  Short Term Goals: Ability to participate in decision making will improve, Ability to verbalize feelings will improve, Ability to disclose and discuss suicidal ideas, Ability to identify and develop effective coping behaviors will improve and Compliance with prescribed medications will improve  Medication Management: RN will administer medications as ordered by provider, will assess and evaluate patient's response and provide education to patient for prescribed medication. RN will report any adverse and/or side effects to prescribing provider.  Therapeutic Interventions: 1 on 1 counseling sessions, Psychoeducation, Medication administration, Evaluate responses to treatment, Monitor vital signs and CBGs as ordered, Perform/monitor CIWA, COWS, AIMS and Fall Risk screenings as ordered, Perform wound care treatments as ordered.  Evaluation of Outcomes: Progressing   LCSW Treatment Plan for Primary Diagnosis: Bipolar depression (HCC) Long Term Goal(s): Safe transition to appropriate next level of care at discharge, Engage patient in therapeutic group addressing interpersonal concerns.  Short Term Goals: Engage patient in aftercare planning with referrals and resources, Increase social support, Increase ability to appropriately verbalize feelings, Increase emotional regulation, Identify triggers associated with mental health/substance abuse issues and Increase skills for wellness and recovery  Therapeutic Interventions: Assess for all discharge needs, 1 to 1 time with Child psychotherapist,  Explore available resources and support systems, Assess for adequacy in community support network, Educate family and significant other(s) on suicide prevention, Complete Psychosocial Assessment, Interpersonal group therapy.  Evaluation of Outcomes: Progressing   Progress in Treatment: Attending groups:  Yes. Participating in groups: Yes. Taking medication as prescribed: Yes. Toleration medication: Yes. Family/Significant other contact made: No, will contact:  Patient declined collateral contact. Patient understands diagnosis: Yes. Discussing patient identified problems/goals with staff: Yes. Medical problems stabilized or resolved: Yes. Denies suicidal/homicidal ideation: Yes. Issues/concerns per patient self-inventory: No. Other: None.  New problem(s) identified: No, Describe:  None reported.   New Short Term/Long Term Goal(s): detox, medication management for mood stabilization; elimination of SI thoughts; development of comprehensive mental wellness/sobriety plan. Update 11/30/19: No changes at this time.  Patient Goals: Pt desires to work on mental health issues and substance use. Update 11/30/19: No changes at this time.  Discharge Plan or Barriers: CSW will assist pt with development of aftercare plan. Pt expresses interest in short-term placement. Update 11/30/19: Referrals have been made. Awaiting response from [facility]. [facility] does not accept her insurance and [facility] has no bed availability until the end of the month.   Reason for Continuation of Hospitalization: Depression Medication stabilization  Estimated Length of Stay: 1-7 days  Recreational Therapy: Patient Stressors: N/A Patient Goal: Patient will engage in groups without prompting or encouragement from LRT x3 group sessions within 5 recreation therapy group sessions.  Attendees: Patient:  11/30/2019 9:17 AM  Physician: Les Pou, MD 11/30/2019 9:17 AM  Nursing:  11/30/2019 9:17 AM  RN Care Manager: 11/30/2019 9:17 AM  Social Worker: Gwenevere Ghazi, MSW, [place], Bridget Hartshorn  11/30/2019 9:17 AM  Recreational Therapist:   11/30/2019 9:17 AM  Other: [name], MSW, LCSW 11/30/2019 9:17 AM  Other: Vilma Meckel. Algis Greenhouse, MSW, [place], LCAS 11/30/2019 9:17 AM  Other: 11/30/2019 9:17 AM    Scribe for Treatment Team: Glenis Smoker, LCSW 11/30/2019 9:17 AM

## 2019-11-30 NOTE — [facility] Counselor (Signed)
CSW contacted [facility] for update on referral. Representative reported that the referral is awaiting review at this time (30 Nov 2019).   [name], MSW, [place], LCASA 11/30/2019 12:05 PM

## 2019-11-30 NOTE — [facility] Group Notes (Signed)
LCSW Group Therapy Note  11/30/2019 2:46 PM  Type of Therapy/Topic:  Group Therapy:  Emotion Regulation  Participation Level:  Active   Description of Group:   The purpose of this group is to assist patients in learning to regulate negative emotions and experience positive emotions. Patients will be guided to discuss ways in which they have been vulnerable to their negative emotions. These vulnerabilities will be juxtaposed with experiences of positive emotions or situations, and patients will be challenged to use positive emotions to combat negative ones. Special emphasis will be placed on coping with negative emotions in conflict situations, and patients will process healthy conflict resolution skills.  Therapeutic Goals: 1. Patient will identify two positive emotions or experiences to reflect on in order to balance out negative emotions 2. Patient will label two or more emotions that they find the most difficult to experience 3. Patient will demonstrate positive conflict resolution skills through discussion and/or role plays  Summary of Patient Progress: Patient was present for group.  She was able to identify that emotions are "how you feel".  She was able to identify that she has struggled with making sure "my emotions don't go out of whack".  She identified that boredom leads to substance use.   Therapeutic Modalities:   Cognitive Behavioral Therapy Feelings Identification Dialectical Behavioral Therapy  [name], MSW, LCSW 11/30/2019 2:46 PM

## 2019-11-30 NOTE — Tx Team (Signed)
CSW contacted [facility] ([facility]) and was informed that the intake coordinator was out for vacation. CSW was also informed that they would have no bed availability until 11/29.  Vilma Meckel. Algis Greenhouse, MSW, LCSW, LCAS 11/30/2019 11:57 AM

## 2019-11-30 NOTE — Progress Notes (Signed)
[facility] MD Progress Note  11/30/2019 11:41 AM Boston Catarino  MRN:  [id number]   Principal Problem: Bipolar depression (HCC) Diagnosis: Principal Problem:   Bipolar depression (HCC) Active Problems:   Opioid use disorder   Insomnia secondary to anxiety   Alcohol abuse   Cocaine abuse (HCC)  34 year old female with bipolar I disorder presented to emergency room voluntarily for worsening depression and suicidal ideation without plan.   Interval History Patient was seen today for re-evaluation.  Nursing reports no events overnight. The patient has no issues with performing ADLs.  Patient has been medication compliant.    Subjective:  On assessment patient reports "I feel better, but still not ready to leave". Reports feeling "less depressed" on Latuda, denies side effects. She reports not feeling suicidal anymore. She reports her sleep is "very good" with Trazodone. She has been participating in groups, and interacting with peers appropriately. She denies any physical complaints. She denies auditory/visual hallucinations and reports feeling safe here in the hospital. No current symptoms of withdrawal, no cravings to use substances on current dose of Suboxone. She continues to desire inpatient, long-term substance abuse treatment vs. Intensive outpatient substance abuse treatment. She states with her current level of depression and anxiety she does not believe she would do well with current level of outpatient treatment. Referrals sent by social workers on Friday. She reached out to [facility] on her own behalf, but no beds available until Nov 29th. She was denied by [facility] due to medications. Application sent to [facility] She is also reaching out to [facility] [employer] [facility] in [place].  Patient also notes increase number of "floaters" in her eyes. If occurs today, will check blood pressure and blood sugar when it occurs. Will also adjust suboxone to be given later in the day instead of in  combination with Klonopin. Will continue to monitor and recommend eye exam at discharge.   Labs: no new results for review.  Total Time spent with patient: 30 minutes  Past Psychiatric History: Threeprevious hospital admission, most recently in October 2021. History of court ordered medications during pregnancy. 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 She states she has been on numerous medications in the past, but can only recall a few. Specifically, she has been on Wellbutrin which induced hyperactivity and mania, Prozac which was not helpful, Seroquel which made her feel tired, Buspar ineffective at max dose, Abilify ineffective for mood stabilization and depression in the past.Lithium caused her to "feel like a zombie"   Past Medical History:  Past Medical History:  Diagnosis Date  . Anxiety   . Bipolar affective (HCC)   . Depression   . Herpes simplex type II infection   . Substance abuse [facility])     Past Surgical History:  Procedure Laterality Date  . NO PAST SURGERIES    . WISDOM TOOTH EXTRACTION     Family History:  Family History  Problem Relation Age of Onset  . Arthritis Maternal Grandmother   . Diabetes Maternal Grandmother   . Diabetes Maternal Grandfather   . Obesity Paternal Grandmother    Family Psychiatric  History: Mother and father with depression  Social History:  Social History   Substance and Sexual Activity  Alcohol Use Not Currently     Social History   Substance and Sexual Activity  Drug Use Not Currently  . Types: Methamphetamines, Other-see comments, Marijuana, "Crack" cocaine   Comment: suboxone daily    Social History   Socioeconomic History  .  Marital status: Single    Spouse name: Not on file  . Number of children: Not on file  . Years of education: Not on file  . Highest education level: Not on file  Occupational History  . Not on file  Tobacco Use  . Smoking status: Current Every Day Smoker    Packs/day:  0.25    Types: Cigarettes  . Smokeless tobacco: Never Used  . Tobacco comment: 4 cigs per day  Vaping Use  . Vaping Use: Never used  Substance and Sexual Activity  . Alcohol use: Not Currently  . Drug use: Not Currently    Types: Methamphetamines, Other-see comments, Marijuana, "Crack" cocaine    Comment: suboxone daily  . Sexual activity: Not Currently    Birth control/protection: None  Other Topics Concern  . Not on file  Social History Narrative  . Not on file   Social Determinants of Health   Financial Resource Strain:   . Difficulty of Paying Living Expenses: Not on file  Food Insecurity:   . Worried About Programme researcher, broadcasting/film/video in the Last Year: Not on file  . Ran Out of Food in the Last Year: Not on file  Transportation Needs:   . Lack of Transportation (Medical): Not on file  . Lack of Transportation (Non-Medical): Not on file  Physical Activity:   . Days of Exercise per Week: Not on file  . Minutes of Exercise per Session: Not on file  Stress:   . Feeling of Stress : Not on file  Social Connections:   . Frequency of Communication with Friends and Family: Not on file  . Frequency of Social Gatherings with Friends and Family: Not on file  . Attends Religious Services: Not on file  . Active Member of Clubs or Organizations: Not on file  . Attends Banker Meetings: Not on file  . Marital Status: Not on file   Additional Social History:                         Sleep: Good  Appetite:  Good  Current Medications: Current Facility-Administered Medications  Medication Dose Route Frequency Provider Last Rate Last Admin  . acetaminophen (TYLENOL) tablet 650 mg  650 mg Oral Q4H PRN Clapacs, Jackquline Denmark, MD   650 mg at 11/30/19 0808  . alum & mag hydroxide-simeth (MAALOX/MYLANTA) 200-200-20 MG/5ML suspension 30 mL  30 mL Oral Q6H PRN Clapacs, John T, MD      . benzocaine (ORAJEL) 10 % mucosal gel   Mouth/Throat TID PRN Jesse Sans, MD   Given at  11/30/19 [phone]  . [START ON 12/01/2019] buprenorphine-naloxone (SUBOXONE) 2-0.5 mg per SL tablet 1 tablet  1 tablet Sublingual Daily Les Pou M, MD      . clonazePAM Scarlette Calico) tablet 1 mg  1 mg Oral BID Clapacs, Jackquline Denmark, MD   1 mg at 11/30/19 5093  . feeding supplement (ENSURE ENLIVE / ENSURE PLUS) liquid 237 mL  237 mL Oral TID BM Jesse Sans, MD   237 mL at 11/30/19 1049  . hydrOXYzine (ATARAX/VISTARIL) tablet 25 mg  25 mg Oral TID PRN Jesse Sans, MD   25 mg at 11/29/19 2105  . lurasidone (LATUDA) tablet 40 mg  40 mg Oral Q supper Jesse Sans, MD   40 mg at 11/29/19 1711  . magnesium hydroxide (MILK OF MAGNESIA) suspension 30 mL  30 mL Oral Daily PRN Clapacs, Jackquline Denmark, MD  30 mL at 11/27/19 0854  . nicotine (NICODERM CQ - dosed in mg/24 hours) patch 21 mg  21 mg Transdermal Daily Clapacs, Jackquline DenmarkJohn T, MD   21 mg at 11/30/19 0809  . ondansetron (ZOFRAN) tablet 4 mg  4 mg Oral Q8H PRN Clapacs, John T, MD      . traZODone (DESYREL) tablet 150 mg  150 mg Oral QHS PRN Gillermo Murdochhompson, Jacqueline, NP   150 mg at 11/29/19 2104    Lab Results: No results found for this or any previous visit (from the past 48 hour(s)).  Blood Alcohol level:  Lab Results  Component Value Date   ETH 19 (H) 11/23/2019   ETH <10 10/14/2019    Metabolic Disorder Labs: No results found for: HGBA1C, MPG No results found for: PROLACTIN No results found for: CHOL, TRIG, HDL, CHOLHDL, VLDL, LDLCALC  Physical Findings: AIMS: Facial and Oral Movements Muscles of Facial Expression: None, normal Lips and Perioral Area: None, normal Jaw: None, normal Tongue: None, normal,Extremity Movements Upper (arms, wrists, hands, fingers): None, normal Lower (legs, knees, ankles, toes): None, normal, Trunk Movements Neck, shoulders, hips: None, normal, Overall Severity Severity of abnormal movements (highest score from questions above): None, normal Incapacitation due to abnormal movements: None, normal Patient's  awareness of abnormal movements (rate only patient's report): No Awareness, Dental Status Current problems with teeth and/or dentures?: No Does patient usually wear dentures?: No  CIWA:  CIWA-Ar Total: 3 COWS:  COWS Total Score: 2  Musculoskeletal: Strength & Muscle Tone: within normal limits Gait & Station: normal Patient leans: Right  Psychiatric Specialty Exam: Physical Exam Vitals and nursing note reviewed.  Constitutional:      Appearance: Normal appearance.  HENT:     Head: Normocephalic and atraumatic.     Right Ear: External ear normal.     Left Ear: External ear normal.     Nose: Nose normal.     Mouth/Throat:     Mouth: Mucous membranes are moist.     Pharynx: Oropharynx is clear.  Eyes:     Extraocular Movements: Extraocular movements intact.     Conjunctiva/sclera: Conjunctivae normal.     Pupils: Pupils are equal, round, and reactive to light.  Cardiovascular:     Rate and Rhythm: Normal rate.     Pulses: Normal pulses.  Pulmonary:     Effort: Pulmonary effort is normal.     Breath sounds: Normal breath sounds.  Abdominal:     General: Abdomen is flat.     Palpations: Abdomen is soft.  Musculoskeletal:        General: No swelling. Normal range of motion.     Cervical back: Normal range of motion and neck supple.  Skin:    General: Skin is warm and dry.  Neurological:     General: No focal deficit present.     Mental Status: She is alert and oriented to person, place, and time.  Psychiatric:        Attention and Perception: Attention and perception normal.        Mood and Affect: Affect normal. Mood is anxious.        Speech: Speech normal.        Behavior: Behavior is cooperative.        Thought Content: Thought content does not include homicidal or suicidal ideation.        Cognition and Memory: Cognition and memory normal.        Judgment: Judgment is impulsive.  Review of Systems  Constitutional: Positive for appetite change. Negative for  fatigue.  HENT: Negative for rhinorrhea and sore throat.   Eyes: Negative for photophobia and visual disturbance.  Respiratory: Negative for cough and shortness of breath.   Cardiovascular: Negative for chest pain and palpitations.  Gastrointestinal: Negative for constipation, diarrhea, nausea and vomiting.  Endocrine: Negative for cold intolerance and heat intolerance.  Genitourinary: Negative for difficulty urinating and dysuria.  Musculoskeletal: Negative for arthralgias and myalgias.  Skin: Negative for rash and wound.  Allergic/Immunologic: Negative for environmental allergies and food allergies.  Neurological: Negative for dizziness and headaches.  Hematological: Negative for adenopathy. Does not bruise/bleed easily.  Psychiatric/Behavioral: Positive for dysphoric mood. Negative for hallucinations and suicidal ideas. The patient is nervous/anxious.     Blood pressure 104/77, pulse 73, temperature 97.7 F (36.5 C), temperature source Oral, resp. rate 18, height  (1.702 m), weight 58.1 kg, last menstrual period 11/18/2019, SpO2 100 %, currently breastfeeding.Body mass index is 20.05 kg/m.  General Appearance: Casual  Eye Contact:  Good  Speech:  Normal Rate  Volume:  Normal  Mood:  "better, less depressed"  Affect:  Appropriate, Congruent and Constricted  Thought Process:  Coherent and Goal Directed  Orientation:  Full (Time, Place, and Person)  Thought Content:  Logical  Suicidal Thoughts:  No  Homicidal Thoughts:  No  Memory:  Immediate;   Fair Recent;   Fair Remote;   Fair  Judgement:  Fair  Insight:  Fair  Psychomotor Activity:  Normal  Concentration:  Concentration: Fair and Attention Span: Fair  Recall:  [employer] of Knowledge:  Fair  Language:  Fair  Akathisia:  No  Handed:  Right  AIMS (if indicated):     Assets:  Communication Skills Desire for Improvement Financial Resources/Insurance Housing Intimacy Resilience Social Support  ADL's:  Intact   Cognition:  WNL  Sleep:  Number of Hours: 9     Treatment Plan Summary: Daily contact with patient to assess and evaluate symptoms and progress in treatment and Medication management   Patient is a 34 year old female with the above-stated past psychiatric history who is seen in follow-up.  Chart reviewed. Patient discussed with nursing. Patient reports partial mood improvement - she is less depressed, she denies suicidal thoughts today. She reports good sleep, improved appetite. She appears brighter, not tearful, visible in the milieu, participates in groups. Current medication (Latuda) was started four days ago and seems to be effective. Anxiety is well-managed by Klonopin and opioid addiction is managed by Suboxone. No med changes made today.   Plan:  -continue inpatient psych admission; 15-minute checks; daily contact with patient to assess and evaluate symptoms and progress in treatment; psychoeducation.  -continue scheduled medications: . [START ON 12/01/2019] buprenorphine-naloxone  1 tablet Sublingual Daily  . clonazePAM  1 mg Oral BID  . feeding supplement  237 mL Oral TID BM  . lurasidone  40 mg Oral Q supper  . nicotine  21 mg Transdermal Daily    -continue PRN medications.  acetaminophen, alum & mag hydroxide-simeth, benzocaine, hydrOXYzine, magnesium hydroxide, ondansetron, traZODone  -Pertinent Labs: no new labs ordered today   -Disposition: Estimated duration of hospitalization: Likely late this week. All necessary aftercare will be arranged prior to discharge. Patient desires inpatient substance abuse treatment vs SAIOP at this time. Referall to [facility] and [facility] pending. Denied by [facility] and [facility]. Also looking into [facility] and [facility] outpatient programs.   -  I certify that the patient  does need, on a daily basis, active treatment furnished directly by or requiring the supervision of inpatient psychiatric facility personnel.   Jesse Sans, MD 11/30/2019,  11:41 AM

## 2019-11-30 NOTE — BHH Counselor (Signed)
CSW contacted [facility] and safety inspector, [facility] program Coordinator [place].  CSW received the voicemail and per the voicemail no [facility] applications will be accepted until 12/12/2019.    CSW left HIPAA compliant message requesting a return call.    CSW unsure of when this policy was put in place as referral was sent the week previously.    [name], MSW, LCSW 11/30/2019 3:53 PM

## 2019-11-30 NOTE — Progress Notes (Signed)
D: Pt alert and oriented. Pt rates depression 3/10, and anxiety 0/10. Pt reports experiencing 8/10 tooth pain, prn meds given. Pt denies/reports experiencing any SI/HI, or AVH at this time.   Pt had c/o of seeing/having floaters of the mornings. MD made aware and orders were placed. Pt's CBG checked as well as BP. VS and CBG reported to MD and observed WNL. A morning medication was moved to afternoon starting tomorrow to see if this will be effective in helping. Pt has had no further c/o seeing/having floaters throughout the rest of the day.    A: Scheduled medications administered to pt, per MD orders. Support and encouragement provided. Frequent verbal contact made. Routine safety checks conducted q15 minutes.   R: No adverse drug reactions noted. Pt verbally contracts for safety at this time. Pt complaint with medications. Pt interacts well with others on the unit. Pt remains safe at this time. Will continue to monitor.

## 2019-11-30 NOTE — Progress Notes (Signed)
Patient alert and oriented x 4, affect is flat but she brightens upon approach, she appears less anxious, her thoughts are organized and coherent, she is interacting appropriately with peers and staff, she currently denies SI/HI/AVH, she was offered emotional support, 15 minutes safety checks maintained will continue to monitor.

## 2019-12-01 ENCOUNTER — Telehealth: Payer: Self-pay | Admitting: *Deleted

## 2019-12-01 DIAGNOSIS — F1190 Opioid use, unspecified, uncomplicated: Secondary | ICD-10-CM

## 2019-12-01 MED ORDER — HYDROXYZINE HCL 25 MG PO TABS: 25.0000 mg | ORAL_TABLET | Freq: Three times a day (TID) | ORAL | 1 refills | Status: DC | PRN

## 2019-12-01 MED ORDER — BUPRENORPHINE HCL-NALOXONE HCL 4-1 MG SL FILM: ORAL_FILM | SUBLINGUAL | 0 refills | Status: DC

## 2019-12-01 MED ORDER — CLONAZEPAM 1 MG PO TABS: 1.0000 mg | ORAL_TABLET | Freq: Two times a day (BID) | ORAL | 1 refills | Status: DC

## 2019-12-01 MED ORDER — LURASIDONE HCL 40 MG PO TABS: 40.0000 mg | ORAL_TABLET | Freq: Every day | ORAL | 1 refills | Status: DC

## 2019-12-01 MED ORDER — TRAZODONE HCL 150 MG PO TABS: 150.0000 mg | ORAL_TABLET | Freq: Every evening | ORAL | 1 refills | Status: DC | PRN

## 2019-12-01 NOTE — Progress Notes (Signed)
Patient pleasant and cooperative. Denies SI, HI, AVH. Appropriate with staff and peers. Request med for tooth pain and anxiety. Given with good relief. Encouragement and support provided. Safety checks maintained. Medications given as prescribed. Pt receptive and remains safe on unit with q 15 min checks.

## 2019-12-01 NOTE — Telephone Encounter (Signed)
Call from case management at [facility] for appt in OUD, pt is being disch today appt 11/30 at 0915 Pt may need refill of suboxone before appt. Will send to megan freeman md and [facility] o[facility] attendings for communication on pt needs r/t oud clinic

## 2019-12-01 NOTE — BHH Counselor (Signed)
CSW contacted [place] with [employer] shelter to check on available beds.  He reports there are no female beds available.   CSW checked on status of referral to [facility].  At this time the patient's referral has not been reviewed.    CSW checked on status of the patient's referrals with [facility] and safety inspector, [facility] program.  CSW left HIPAA compliant voicemail with [employer].    CSW attempted to contact the [employer].  CSW left HIPAA compliant voicemail.    [name], MSW, LCSW 12/01/2019 9:15 AM

## 2019-12-01 NOTE — BHH Counselor (Signed)
CSW and pt completed a conference call with [name] at [facility]. She provided patient with information on admission, rules and requirements. She reported that Subaxone is NOT allowed.  Due to this patient will need to be detoxed before admission.   [name], MSW, LCSW 12/01/2019 9:50 AM

## 2019-12-01 NOTE — Progress Notes (Addendum)
  [facility] Adult Case Management Discharge Plan :  Will you be returning to the same living situation after discharge:  No. At discharge, do you have transportation home?: Yes,  CSW will assist with transportation needs. Do you have the ability to pay for your medications: Yes,  [employer]  Release of information consent forms completed and in the chart;  Patient's signature needed at discharge.  Patient to Follow up at:    Follow-up Information    [facility] Follow up.   Specialty: Behavioral Health Why: Walk in appointments are Monday through Friday at 8:30AM.  First come, first serve. Thanks! Contact information: [address] [place] [address] [phone]       [facility] Follow up.   Why: Your referral was sent 11/29/2019.  As of 12/01/2019 it has not been reviewed.  Please follow up.  Thanks!  Contact information: [address] [place] [address] [phone]        [facility] Follow up.   Why: Your referral was sent 11/25/2019.  Calls/emails made by this CSW to identify the status have not been returned.  Please follow up.  Thanks! Contact information: [address] [place], [place] [address] office: [phone] fax: [phone]        [facility] Follow up on 12/13/2019.   Why: You are scheduled for an appointment on Tuesday, November 30th at 9:00am. If you have any questions please call Myriam Jacobson at [phone].  Contact information: [address] [place] [address] [phone]       [facility]. Go on 12/05/2019.   Why: You are scheduled with [facility] on Monday at 8:30am. You will need to use the behavioral health entrance on the lower lever on the side of the building. Contact information: [address] [place] [address] [phone]                         Next level of  care provider has access to [facility] Link:no  Safety Planning and Suicide Prevention discussed: Yes,  SPE completed with the patient.    Have you used any form of tobacco in the last 30 days? (Cigarettes, Smokeless Tobacco, Cigars, and/or Pipes): Yes  Has patient been referred to the Quitline?: Patient refused referral  Patient has been referred for addiction treatment: Yes  Harden Mo, LCSW 12/01/2019, 9:08 AM

## 2019-12-01 NOTE — BHH Group Notes (Signed)
LCSW Group Therapy Note  12/01/2019 3:20 PM  Type of Therapy/Topic:  Group Therapy:  Balance in Life  Participation Level:  Active  Description of Group:    This group will address the concept of balance and how it feels and looks when one is unbalanced. Patients will be encouraged to process areas in their lives that are out of balance and identify reasons for remaining unbalanced. Facilitators will guide patients in utilizing problem-solving interventions to address and correct the stressor making their life unbalanced. Understanding and applying boundaries will be explored and addressed for obtaining and maintaining a balanced life. Patients will be encouraged to explore ways to assertively make their unbalanced needs known to significant others in their lives, using other group members and facilitator for support and feedback.  Therapeutic Goals: 1. Patient will identify two or more emotions or situations they have that consume much of in their lives. 2. Patient will identify signs/triggers that life has become out of balance:  3. Patient will identify two ways to set boundaries in order to achieve balance in their lives:  4. Patient will demonstrate ability to communicate their needs through discussion and/or role plays  Summary of Patient Progress: Patient was present in group and an active participant.  Patient was able to share why balance was important. She discussed how people should be balanced in all areas of life. She reports that she struggles with healthy boundaries.    Therapeutic Modalities:   Cognitive Behavioral Therapy Solution-Focused Therapy Assertiveness Training  [name] MSW, LCSW 12/01/2019 3:20 PM

## 2019-12-01 NOTE — Progress Notes (Signed)
Recreation Therapy Notes ° °INPATIENT RECREATION TR PLAN ° °Patient Details °Name: Marcia West °MRN: [id number] °DOB: 04/14/1985 °Today's Date: 12/01/2019 ° °Rec Therapy Plan °Is patient appropriate for Therapeutic Recreation?: Yes °Treatment times per week: at least 3 °Estimated Length of Stay: 5-7days °TR Treatment/Interventions: Group participation (Comment) ° °Discharge Criteria °Pt will be discharged from therapy if:: Discharged °Treatment plan/goals/alternatives discussed and agreed upon by:: Patient/family ° °Discharge Summary °Short term goals set: Patient will successfully identify 2 ways of making healthy decisions post d/c within 5 recreation therapy group sessions °Short term goals met: Complete °Progress toward goals comments: Groups attended °Which groups?: Goal setting, Coping skills, Stress management, Other (Comment) (Happiness) °Reason goals not met: N/A °Therapeutic equipment acquired: N/A °Reason patient discharged from therapy: Discharge from hospital °Pt/family agrees with progress & goals achieved: Yes °Date patient discharged from therapy: 12/01/19 ° ° °   °12/01/2019, 12:34 PM ° °

## 2019-12-01 NOTE — Plan of Care (Signed)
  Problem: Decision Making Goal: STG - Patient will successfully identify 2 ways of making healthy decisions post d/c within 5 recreation therapy group sessions Description: STG - Patient will successfully identify 2 ways of making healthy decisions post d/c within 5 recreation therapy group sessions Outcome: Completed/Met

## 2019-12-01 NOTE — Progress Notes (Signed)
Recreation Therapy Notes  Date: 12/01/2019  Time: 9:30 am   Location: Craft room  Behavioral response: Appropriate  Intervention Topic: Goals   Discussion/Intervention:  Group content on today was focused on goals. Patients described what goals are and how they define goals. Individuals expressed how they go about setting goals and reaching them. The group identified how important goals are and if they make short term goals to reach long term goals. Patients described how many goals they work on at a time and what affects them not reaching their goal. Individuals described how much time they put into planning and obtaining their goals. The group participated in the intervention "My Goal Board" and made personal goal boards to help them achieve their goal. Clinical Observations/Feedback: Patient came to group late and was focused on what peers and staff had to say about goals. Individual was social with peers and staff while participating in the intervention.  Marcia West LRT/CTRS         [name] 12/01/2019 12:29 PM

## 2019-12-01 NOTE — Telephone Encounter (Signed)
Refilled 2 week supply of suboxone.

## 2019-12-01 NOTE — Progress Notes (Signed)
D- Patient alert and oriented. Patient presents in a pleasant mood on assessment stating that she slept "pretty good" last night and had no complaints to voice to this Clinical research associate. Patient denies anxiety, however, she stated that her depression is "pretty good". Patient also denies SI, HI, AVH, and pain at this time. Patient reports that overall, she is feeling "good" and had no stated goals for today.  A- Scheduled medications administered to patient, per MD orders. Support and encouragement provided.  Routine safety checks conducted every 15 minutes.  Patient informed to notify staff with problems or concerns.  R- No adverse drug reactions noted. Patient contracts for safety at this time. Patient compliant with medications and treatment plan. Patient receptive, calm, and cooperative. Patient interacts well with others on the unit.  Patient remains safe at this time.

## 2019-12-01 NOTE — Discharge Summary (Addendum)
Physician Discharge Summary Note  Patient:  Marcia West is an 34 y.o., female MRN:  [id number] DOB:  [birth year] Patient phone:  [phone] (home)  Patient address:   [address] [place] [id number],  Total Time spent with patient: 30 minutes  Date of Admission:  11/23/2019 Date of Discharge: 12/01/2019  Reason for Admission:  Worsening depression and suicidal ideation without plan in setting of medication noncompliance and relapse on cocaine, cannabis, and alcohol  Principal Problem: Bipolar depression [facility](HCC) Discharge Diagnoses: Principal Problem:   Bipolar depression (HCC) Active Problems:   Opioid use disorder   Insomnia secondary to anxiety   Alcohol abuse   Cocaine abuse (HCC)   Past Psychiatric History: Three previous hospital admission, most recently in October 2021. History of court ordered medications during pregnancy. 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 She states she has been on numerous medications in the past, but can only recall a few. Specifically, she has been on Wellbutrin which induced hyperactivity and mania, Prozac which was not helpful, Seroquel which made her feel tired, Buspar ineffective at max dose, Abilify ineffective for mood stabilization and depression in the past. Lithium caused her to "feel like a zombie"  Past Medical History:  Past Medical History:  Diagnosis Date  . Anxiety   . Bipolar affective (HCC)   . Depression   . Herpes simplex type II infection   . Substance abuse [facility](HCC)     Past Surgical History:  Procedure Laterality Date  . NO PAST SURGERIES    . WISDOM TOOTH EXTRACTION     Family History:  Family History  Problem Relation Age of Onset  . Arthritis Maternal Grandmother   . Diabetes Maternal Grandmother   . Diabetes Maternal Grandfather   . Obesity Paternal Grandmother    Family Psychiatric  History: Mother and father with depression Social History:  Social History   Substance and  Sexual Activity  Alcohol Use Not Currently     Social History   Substance and Sexual Activity  Drug Use Not Currently  . Types: Methamphetamines, Other-see comments, Marijuana, "Crack" cocaine   Comment: suboxone daily    Social History   Socioeconomic History  . Marital status: Single    Spouse name: Not on file  . Number of children: Not on file  . Years of education: Not on file  . Highest education level: Not on file  Occupational History  . Not on file  Tobacco Use  . Smoking status: Current Every Day Smoker    Packs/day: 0.25    Types: Cigarettes  . Smokeless tobacco: Never Used  . Tobacco comment: 4 cigs per day  Vaping Use  . Vaping Use: Never used  Substance and Sexual Activity  . Alcohol use: Not Currently  . Drug use: Not Currently    Types: Methamphetamines, Other-see comments, Marijuana, "Crack" cocaine    Comment: suboxone daily  . Sexual activity: Not Currently    Birth control/protection: None  Other Topics Concern  . Not on file  Social History Narrative  . Not on file   Social Determinants of Health   Financial Resource Strain:   . Difficulty of Paying Living Expenses: Not on file  Food Insecurity:   . Worried About Programme researcher, broadcasting/film/videounning Out of Food in the Last Year: Not on file  . Ran Out of Food in the Last Year: Not on file  Transportation Needs:   . Lack of Transportation (Medical): Not on file  . Lack of Transportation (Non-Medical):  Not on file  Physical Activity:   . Days of Exercise per Week: Not on file  . Minutes of Exercise per Session: Not on file  Stress:   . Feeling of Stress : Not on file  Social Connections:   . Frequency of Communication with Friends and Family: Not on file  . Frequency of Social Gatherings with Friends and Family: Not on file  . Attends Religious Services: Not on file  . Active Member of Clubs or Organizations: Not on file  . Attends Banker Meetings: Not on file  . Marital Status: Not on file     Hospital Course:  34 year old female with bipolar I disorder presented to emergency room voluntarily for worsening depression and suicidal ideation without plan.  She notes that she initially felt better when she left the hospital, but noted she began to feel "like a zombie" while on lithium. She noted she was unable to think straight, or feel any emotions. She stopped this abruptly two days prior to admission along with suboxone. She relapsed on cocaine and alcohol and noted her mood worsened even further and she began having incessant thoughts of suicide. She also endorsed poor sleep, low energy, anhedonia, poor appetite, weight loss, and hopelessness. She notes many days she wouldn't get out of bed to shower or eat. She sites her daughter as the reason she did not formulate a suicide plan or act on those thoughts.  She had previously been on max dose of Abilify without improvement when Lithium was added as adjunct. She has also previously tried Seroquel which was too sedating. Many SSRIs have been tried including Prozac, and she has also been on Wellbutrin all of which have induced mania. She was started on Latuda 40 mg with supper while in the hospital. With this medication, and period of sobriety her mood gradually improved. She was smiling, interacting with peers, eating more, and attending to self-care. Her goal was to enter long-term substance abuse treatment. She was denied by [facility] due to Medicaid, and by [facility] due to medication regimen. Her referrals with [facility] and [facility] are still pending at this time. She plans to live in shelter and follow-up with [facility] until bed becomes available at above facilities.   Physical Findings: AIMS: Facial and Oral Movements Muscles of Facial Expression: None, normal Lips and Perioral Area: None, normal Jaw: None, normal Tongue: None, normal,Extremity Movements Upper (arms, wrists, hands, fingers): None, normal Lower (legs, knees, ankles, toes):  None, normal, Trunk Movements Neck, shoulders, hips: None, normal, Overall Severity Severity of abnormal movements (highest score from questions above): None, normal Incapacitation due to abnormal movements: None, normal Patient's awareness of abnormal movements (rate only patient's report): No Awareness, Dental Status Current problems with teeth and/or dentures?: No Does patient usually wear dentures?: No  CIWA:  CIWA-Ar Total: 3 COWS:  COWS Total Score: 2  Musculoskeletal: Strength & Muscle Tone: within normal limits Gait & Station: normal Patient leans: N/A  Psychiatric Specialty Exam: Physical Exam Vitals and nursing note reviewed.  Constitutional:      Appearance: Normal appearance.  HENT:     Head: Normocephalic and atraumatic.     Right Ear: External ear normal.     Left Ear: External ear normal.     Nose: Nose normal.     Mouth/Throat:     Mouth: Mucous membranes are moist.     Pharynx: Oropharynx is clear.  Eyes:     Extraocular Movements: Extraocular movements intact.  Conjunctiva/sclera: Conjunctivae normal.     Pupils: Pupils are equal, round, and reactive to light.  Cardiovascular:     Rate and Rhythm: Normal rate.     Pulses: Normal pulses.  Pulmonary:     Effort: Pulmonary effort is normal.     Breath sounds: Normal breath sounds.  Abdominal:     General: Abdomen is flat.     Palpations: Abdomen is soft.  Musculoskeletal:        General: No swelling. Normal range of motion.     Cervical back: Normal range of motion and neck supple.  Skin:    General: Skin is warm and dry.  Neurological:     General: No focal deficit present.     Mental Status: She is alert and oriented to person, place, and time.  Psychiatric:        Mood and Affect: Mood normal.        Behavior: Behavior normal.        Thought Content: Thought content normal.        Judgment: Judgment normal.     Review of Systems  Constitutional: Negative for appetite change and fatigue.   HENT: Negative for rhinorrhea and sore throat.   Eyes: Negative for photophobia and visual disturbance.  Respiratory: Negative for cough and shortness of breath.   Cardiovascular: Negative for chest pain and palpitations.  Gastrointestinal: Negative for constipation, diarrhea, nausea and vomiting.  Endocrine: Negative for cold intolerance and heat intolerance.  Genitourinary: Negative for difficulty urinating and dysuria.  Musculoskeletal: Negative for arthralgias and myalgias.  Skin: Negative for rash and wound.  Allergic/Immunologic: Negative for environmental allergies and food allergies.  Neurological: Negative for dizziness and headaches.  Hematological: Negative for adenopathy. Does not bruise/bleed easily.  Psychiatric/Behavioral: Negative for hallucinations and suicidal ideas.   Blood pressure 95/72, pulse 90, temperature 98.2 F (36.8 C), resp. rate 18, height 5\' 7"  (1.702 m), weight 58.1 kg, last menstrual period 11/18/2019, SpO2 100 %, currently breastfeeding.Body mass index is 20.05 kg/m.  General Appearance: Well Groomed  13/05/2019::  Good  Speech:  Clear and Coherent and Normal Rate  Volume:  Normal  Mood:  Euthymic  Affect:  Congruent  Thought Process:  Coherent and Linear  Orientation:  Full (Time, Place, and Person)  Thought Content:  Logical  Suicidal Thoughts:  No  Homicidal Thoughts:  No  Memory:  Immediate;   Fair Recent;   Fair Remote;   Fair  Judgement:  Fair  Insight:  Fair  Psychomotor Activity:  Normal  Concentration:  Fair  Recall:  [id number] of Knowledge:Fair  Language: Fair  Akathisia:  Negative  Handed:  Right  AIMS (if indicated):     Assets:  Communication Skills Desire for Improvement Financial Resources/Insurance Intimacy Leisure Time Physical Health Resilience Social Support Talents/Skills Transportation  Sleep:  Number of Hours: 9  Cognition: WNL  ADL's:  Intact        Have you used any form of tobacco in the last 30  days? (Cigarettes, Smokeless Tobacco, Cigars, and/or Pipes): Yes  Has this patient used any form of tobacco in the last 30 days? (Cigarettes, Smokeless Tobacco, Cigars, and/or Pipes) Yes, Yes, A prescription for an FDA-approved tobacco cessation medication was offered at discharge and the patient refused  Blood Alcohol level:  Lab Results  Component Value Date   ETH 19 (H) 11/23/2019   ETH <10 10/14/2019    Metabolic Disorder Labs:  No results found for: HGBA1C, MPG No  results found for: PROLACTIN No results found for: CHOL, TRIG, HDL, CHOLHDL, VLDL, LDLCALC  See Psychiatric Specialty Exam and Suicide Risk Assessment completed by Attending Physician prior to discharge.  Discharge destination:  Other:  Shelter  Is patient on multiple antipsychotic therapies at discharge:  No   Has Patient had three or more failed trials of antipsychotic monotherapy by history:  No  Recommended Plan for Multiple Antipsychotic Therapies: NA  Discharge Instructions    Diet general   Complete by: As directed    Increase activity slowly   Complete by: As directed      Allergies as of 12/01/2019   No Known Allergies     Medication List    STOP taking these medications   ARIPiprazole 5 MG tablet Commonly known as: ABILIFY   lithium carbonate 450 MG CR tablet Commonly known as: ESKALITH   nicotine 21 mg/24hr patch Commonly known as: NICODERM CQ - dosed in mg/24 hours     TAKE these medications     Indication  Buprenorphine HCl-Naloxone HCl 4-1 MG Film Take 1/2 film daily SL.  Indication: Opioid Dependence   clonazePAM 1 MG tablet Commonly known as: KLONOPIN Take 1 tablet (1 mg total) by mouth 2 (two) times daily.  Indication: Feeling Anxious   hydrOXYzine 25 MG tablet Commonly known as: ATARAX/VISTARIL Take 1 tablet (25 mg total) by mouth 3 (three) times daily as needed for anxiety.  Indication: Feeling Anxious   lurasidone 40 MG Tabs tablet Commonly known as: LATUDA Take 1  tablet (40 mg total) by mouth daily with supper.  Indication: Depressive Phase of Manic-Depression   traZODone 150 MG tablet Commonly known as: DESYREL Take 1 tablet (150 mg total) by mouth at bedtime as needed for sleep.  Indication: Trouble Sleeping       Follow-up Information    [facility] Follow up.   Specialty: Behavioral Health Why: Walk in appointments are Monday through Friday at 8:30AM.  First come, first serve. Thanks! Contact information: [address] [place] [address] [phone]       [facility] Follow up.   Why: Your referral was sent 11/29/2019 Contact information: [address] [place] [address] [phone]        [facility] Follow up.   Why: Your referral was sent 11/25/2019.  Calls/emails made by this CSW to identify the status have not been returned.  Please follow up.  Thanks! Contact information: [address] [place], [place] [address] office: [phone] fax: [phone]              Follow-up recommendations:  Activity:  as tolerated Diet:  regular diet  Comments:  30-day scripts with 1 refill sent to [facility] on [address] in [place], [place] per patient request.   Signed: Jesse Sans, MD 12/01/2019, 9:24 AM

## 2019-12-01 NOTE — Progress Notes (Signed)
Patient ID: Marcia West, female   DOB: 1985-02-17, 34 y.o.   MRN: [id number]   Discharge Note:  Patient denies SI/HI/AVH at this time. Discharge instructions, AVS, prescriptions and transition record gone over with patient. Patient agrees to comply with medication management, follow-up visit, and outpatient therapy. Patient belongings returned to patient. Patient questions and concerns addressed and answered. Patient ambulatory off unit. Patient discharged to home with her Sister.

## 2019-12-01 NOTE — Telephone Encounter (Signed)
Thanks Myriam Jacobson! I do not have a suboxone license so she will need refill from your clinic. Thanks! She has been provided with 30-day script and 1 refill for Klonopin 1 mg BID, Latuda 40 mg with supper, and Trazodone 150 mg QHS PRN for insomnia.

## 2019-12-01 NOTE — Plan of Care (Signed)
  Problem: Safety: Goal: Ability to remain free from injury will improve Outcome: Progressing   

## 2019-12-01 NOTE — BHH Suicide Risk Assessment (Signed)
[facility] Discharge Suicide Risk Assessment   Principal Problem: Bipolar depression (HCC) Discharge Diagnoses: Principal Problem:   Bipolar depression (HCC) Active Problems:   Opioid use disorder   Insomnia secondary to anxiety   Alcohol abuse   Cocaine abuse (HCC)   Total Time spent with patient: 30 minutes  Musculoskeletal: Strength & Muscle Tone: within normal limits Gait & Station: normal Patient leans: N/A  Psychiatric Specialty Exam: Review of Systems  Constitutional: Negative for appetite change and fatigue.  HENT: Negative for rhinorrhea and sore throat.   Eyes: Negative for photophobia and visual disturbance.  Respiratory: Negative for cough and shortness of breath.   Cardiovascular: Negative for chest pain and palpitations.  Gastrointestinal: Negative for constipation, diarrhea, nausea and vomiting.  Endocrine: Negative for cold intolerance and heat intolerance.  Genitourinary: Negative for difficulty urinating and dysuria.  Musculoskeletal: Negative for arthralgias and myalgias.  Skin: Negative for rash and wound.  Allergic/Immunologic: Negative for environmental allergies and food allergies.  Neurological: Negative for dizziness and headaches.  Hematological: Negative for adenopathy. Does not bruise/bleed easily.  Psychiatric/Behavioral: Negative for hallucinations and suicidal ideas.    Blood pressure 95/72, pulse 90, temperature 98.2 F (36.8 C), resp. rate 18, height 5\' 7"  (1.702 m), weight 58.1 kg, last menstrual period 11/18/2019, SpO2 100 %, currently breastfeeding.Body mass index is 20.05 kg/m.  General Appearance: Well Groomed  Eye Contact::  Good  Speech:  Clear and Coherent and Normal Rate409  Volume:  Normal  Mood:  Euthymic  Affect:  Congruent  Thought Process:  Coherent and Linear  Orientation:  Full (Time, Place, and Person)  Thought Content:  Logical  Suicidal Thoughts:  No  Homicidal Thoughts:  No  Memory:  Immediate;   Fair Recent;    Fair Remote;   Fair  Judgement:  Fair  Insight:  Fair  Psychomotor Activity:  Normal  Concentration:  Fair  Recall:  [id number] of Knowledge:Fair  Language: Fair  Akathisia:  Negative  Handed:  Right  AIMS (if indicated):     Assets:  Communication Skills Desire for Improvement Financial Resources/Insurance Intimacy Leisure Time Physical Health Resilience Social Support Talents/Skills Transportation  Sleep:  Number of Hours: 9  Cognition: WNL  ADL's:  Intact   Mental Status Per Nursing Assessment::   On Admission:  NA  Demographic Factors:  Caucasian  Loss Factors: NA  Historical Factors: Impulsivity  Risk Reduction Factors:   Responsible for children under 16 years of age, Sense of responsibility to family, Positive social support, Positive therapeutic relationship and Positive coping skills or problem solving skills  Continued Clinical Symptoms:  Bipolar Disorder:   Depressive phase Alcohol/Substance Abuse/Dependencies  Cognitive Features That Contribute To Risk:  None    Suicide Risk:  Minimal: No identifiable suicidal ideation.  Patients presenting with no risk factors but with morbid ruminations; may be classified as minimal risk based on the severity of the depressive symptoms   Follow-up Information    [facility] Follow up.   Contact information: [address] [place] [address]               Plan Of Care/Follow-up recommendations:  Activity:  as tolerated Diet:  regular diet  [phone], MD 12/01/2019, 9:06 AM

## 2019-12-02 NOTE — Progress Notes (Signed)
BRIEF PHARMACY NOTE   This patient attended and participated in Medication Management Group counseling (12/01/19) led by [facility] staff pharmacist.  This interactive class reviews basic information about prescription medications and education on personal responsibility in medication management.  The class also includes general knowledge of 3 main classes of behavioral medications, including antipsychotics, antidepressants, and mood stabilizers.     Patient behavior was appropriate for group setting.   Educational materials sourced from:  "Medication Do's and Don'ts" from WWW.MED-PASS.COM   "Mental Health Medications" from National Institute of Mental Health Https://www.nimh.nih.gov/health/topics/mental-health-medications/index.shtml#part 149856     Welborn Keena M Jan Walters ,PharmD 12/02/2019 , 7:08 AM 

## 2019-12-05 ENCOUNTER — Telehealth: Payer: Self-pay

## 2019-12-05 DIAGNOSIS — F3111 Bipolar disorder, current episode manic without psychotic features, mild: Secondary | ICD-10-CM | POA: Diagnosis not present

## 2019-12-05 NOTE — Telephone Encounter (Signed)
Transition Care Management Unsuccessful Follow-up Telephone Call  Date of discharge and from where:  12/01/2019 [facility]  Attempts:  1st Attempt  Reason for unsuccessful TCM follow-up call:  Unable to leave message.

## 2019-12-06 NOTE — Telephone Encounter (Signed)
Transition Care Management Unsuccessful Follow-up Telephone Call  Date of discharge and from where:  12/01/2019 [facility]  Attempts:  2nd Attempt  Reason for unsuccessful TCM follow-up call:  Unable to leave message

## 2019-12-07 DIAGNOSIS — F315 Bipolar disorder, current episode depressed, severe, with psychotic features: Secondary | ICD-10-CM | POA: Diagnosis not present

## 2019-12-09 NOTE — Telephone Encounter (Signed)
Transition Care Management Unsuccessful Follow-up Telephone Call  Date of discharge and from where:  12/01/2019 [facility]  Attempts:  3rd Attempt  Reason for unsuccessful TCM follow-up call:  Unable to leave message

## 2019-12-13 ENCOUNTER — Ambulatory Visit (INDEPENDENT_AMBULATORY_CARE_PROVIDER_SITE_OTHER): Payer: Medicaid Other | Admitting: Internal Medicine

## 2019-12-13 VITALS — BP 104/72 | HR 77 | Temp 98.2°F | Wt 143.1 lb

## 2019-12-13 DIAGNOSIS — F319 Bipolar disorder, unspecified: Secondary | ICD-10-CM | POA: Diagnosis not present

## 2019-12-13 DIAGNOSIS — F1190 Opioid use, unspecified, uncomplicated: Secondary | ICD-10-CM

## 2019-12-13 MED ORDER — BUPRENORPHINE HCL-NALOXONE HCL 4-1 MG SL FILM: ORAL_FILM | SUBLINGUAL | 1 refills | Status: DC

## 2019-12-13 NOTE — Assessment & Plan Note (Signed)
[facility]

## 2019-12-13 NOTE — Assessment & Plan Note (Signed)
I discussed we will continue to see her for her opioid use disorder and Suboxone MAT.  We will check a urine tox screen today.  Ongoing follow-up with psychiatry/behavioral health.  Continue Suboxone 2-0.5 mg daily

## 2019-12-13 NOTE — Progress Notes (Signed)
   12/13/2019  Marcia West presents for follow up of opioid use disorder I have reviewed the prior induction visit, follow up visits, and telephone encounters relevant to opiate use disorder (OUD) treatment.   Current daily dose: Suboxone 2-0.5mg  daily  Date of Induction: 03/30/18  Current follow up interval, in weeks: 4-8 weeks  The patient has been adherent with the buprenorphine for OUD contract.   Last UDS Result: ED UTox 11/23/19 Cocaine, BZD, THC  HPI: Marcia West presents for follow up for OUD.  Marcia West was hospitalized at [facility] behavioral health earlier this month for severe depression/bipolar disorder.  Medications have been changed and she is now on Jordan.  Medications were reconciled today in the chart.  She did score very high on the PHQ-9 scoring system.  She is seen in [facility] outpatient for her behavioral health.  She thinks it may be helping slightly but does not think there has been enough time for the medications to fully work.  She has no current suicidal ideation. For her opioid use disorder she reports she is continuing to do well on very low-dose Suboxone therapy taking only one half of a 4-1 mg strip daily.  She previously had expressed interest in coming off Suboxone altogether however she is now more interested in staying on a low dose as she feels it helps to control cravings and prevent relapse.  Exam:   Vitals:   12/13/19 0939  BP: 104/72  Pulse: 77  Temp: 98.2 F (36.8 C)  TempSrc: Oral  SpO2: 100%  Weight: 143 lb 1.6 oz (64.9 kg)  General: comfortable, appropriate affect. HEENT: good eye contact Pulm: normal effort Ext: warm and well perfused  Assessment/Plan:  See Problem Based Charting in the Encounters Tab     Gust Rung, DO  12/13/2019  2:13 PM

## 2019-12-19 LAB — TOXASSURE SELECT,+ANTIDEPR,UR

## 2020-01-12 DIAGNOSIS — B373 Candidiasis of vulva and vagina: Secondary | ICD-10-CM | POA: Diagnosis not present

## 2020-01-12 DIAGNOSIS — N888 Other specified noninflammatory disorders of cervix uteri: Secondary | ICD-10-CM | POA: Diagnosis not present

## 2020-01-12 DIAGNOSIS — N898 Other specified noninflammatory disorders of vagina: Secondary | ICD-10-CM | POA: Diagnosis not present

## 2020-01-12 DIAGNOSIS — R102 Pelvic and perineal pain: Secondary | ICD-10-CM | POA: Diagnosis not present

## 2020-01-12 DIAGNOSIS — R21 Rash and other nonspecific skin eruption: Secondary | ICD-10-CM | POA: Diagnosis not present

## 2020-01-13 ENCOUNTER — Telehealth: Payer: Self-pay

## 2020-01-13 NOTE — Telephone Encounter (Signed)
Transition Care Management Unsuccessful Follow-up Telephone Call  Date of discharge and from where:  12/30/201 [facility]  Attempts:  1st Attempt  Reason for unsuccessful TCM follow-up call:  Left voice message

## 2020-01-16 NOTE — Telephone Encounter (Signed)
Transition Care Management Unsuccessful Follow-up Telephone Call  Date of discharge and from where:  01/12/2020 from [facility].   Attempts:  2nd Attempt  Reason for unsuccessful TCM follow-up call:  Left voice message

## 2020-01-18 NOTE — Telephone Encounter (Signed)
Transition Care Management Unsuccessful Follow-up Telephone Call  Date of discharge and from where:  01/12/2020 from [facility].  Attempts:  3rd Attempt  Reason for unsuccessful TCM follow-up call:  Left voice message

## 2020-01-19 ENCOUNTER — Encounter: Payer: Self-pay | Admitting: Emergency Medicine

## 2020-01-19 ENCOUNTER — Encounter: Payer: Self-pay | Admitting: Psychiatry

## 2020-01-19 ENCOUNTER — Inpatient Hospital Stay: Admission: AD | Admit: 2020-01-19 | Payer: Medicaid Other | Source: Intra-hospital | Admitting: Behavioral Health

## 2020-01-19 ENCOUNTER — Inpatient Hospital Stay
Admission: AD | Admit: 2020-01-19 | Discharge: 2020-01-30 | DRG: 885 | Disposition: A | Discharge status: Home / Self Care | Payer: Medicaid Other | Source: Intra-hospital | Attending: Behavioral Health | Admitting: Behavioral Health

## 2020-01-19 ENCOUNTER — Emergency Department
Admission: EM | Admit: 2020-01-19 | Discharge: 2020-01-19 | Disposition: A | Discharge status: Other Acute Inpatient Hospital | Payer: Medicaid Other | Attending: Emergency Medicine | Admitting: Emergency Medicine

## 2020-01-19 ENCOUNTER — Other Ambulatory Visit: Payer: Self-pay

## 2020-01-19 DIAGNOSIS — Z20822 Contact with and (suspected) exposure to covid-19: Secondary | ICD-10-CM | POA: Insufficient documentation

## 2020-01-19 DIAGNOSIS — F1410 Cocaine abuse, uncomplicated: Secondary | ICD-10-CM | POA: Diagnosis present

## 2020-01-19 DIAGNOSIS — Z79899 Other long term (current) drug therapy: Secondary | ICD-10-CM | POA: Insufficient documentation

## 2020-01-19 DIAGNOSIS — T148XXA Other injury of unspecified body region, initial encounter: Secondary | ICD-10-CM

## 2020-01-19 DIAGNOSIS — F17210 Nicotine dependence, cigarettes, uncomplicated: Secondary | ICD-10-CM | POA: Insufficient documentation

## 2020-01-19 DIAGNOSIS — R45851 Suicidal ideations: Secondary | ICD-10-CM | POA: Diagnosis not present

## 2020-01-19 DIAGNOSIS — F1210 Cannabis abuse, uncomplicated: Secondary | ICD-10-CM | POA: Insufficient documentation

## 2020-01-19 DIAGNOSIS — F419 Anxiety disorder, unspecified: Secondary | ICD-10-CM | POA: Diagnosis present

## 2020-01-19 DIAGNOSIS — S21002A Unspecified open wound of left breast, initial encounter: Secondary | ICD-10-CM | POA: Diagnosis not present

## 2020-01-19 DIAGNOSIS — F315 Bipolar disorder, current episode depressed, severe, with psychotic features: Secondary | ICD-10-CM | POA: Diagnosis not present

## 2020-01-19 DIAGNOSIS — F32A Depression, unspecified: Secondary | ICD-10-CM | POA: Diagnosis present

## 2020-01-19 DIAGNOSIS — G4700 Insomnia, unspecified: Secondary | ICD-10-CM | POA: Insufficient documentation

## 2020-01-19 DIAGNOSIS — F319 Bipolar disorder, unspecified: Secondary | ICD-10-CM

## 2020-01-19 DIAGNOSIS — T189XXA Foreign body of alimentary tract, part unspecified, initial encounter: Secondary | ICD-10-CM | POA: Diagnosis not present

## 2020-01-19 DIAGNOSIS — F1110 Opioid abuse, uncomplicated: Secondary | ICD-10-CM | POA: Diagnosis present

## 2020-01-19 DIAGNOSIS — S3992XA Unspecified injury of lower back, initial encounter: Secondary | ICD-10-CM | POA: Diagnosis present

## 2020-01-19 DIAGNOSIS — S30810A Abrasion of lower back and pelvis, initial encounter: Secondary | ICD-10-CM | POA: Diagnosis not present

## 2020-01-19 DIAGNOSIS — F314 Bipolar disorder, current episode depressed, severe, without psychotic features: Secondary | ICD-10-CM | POA: Diagnosis not present

## 2020-01-19 DIAGNOSIS — F5105 Insomnia due to other mental disorder: Secondary | ICD-10-CM | POA: Diagnosis present

## 2020-01-19 DIAGNOSIS — F1010 Alcohol abuse, uncomplicated: Secondary | ICD-10-CM | POA: Diagnosis not present

## 2020-01-19 DIAGNOSIS — T17908A Unspecified foreign body in respiratory tract, part unspecified causing other injury, initial encounter: Secondary | ICD-10-CM

## 2020-01-19 LAB — RESP PANEL BY RT-PCR (FLU A&B, COVID) ARPGX2
Influenza A by PCR: NEGATIVE
Influenza B by PCR: NEGATIVE
SARS Coronavirus 2 by RT PCR: NEGATIVE

## 2020-01-19 LAB — URINE DRUG SCREEN, QUALITATIVE (ARMC ONLY)
Amphetamines, Ur Screen: NOT DETECTED
Barbiturates, Ur Screen: NOT DETECTED
Benzodiazepine, Ur Scrn: POSITIVE — AB
Cannabinoid 50 Ng, Ur ~~LOC~~: POSITIVE — AB
Cocaine Metabolite,Ur ~~LOC~~: POSITIVE — AB
MDMA (Ecstasy)Ur Screen: NOT DETECTED
Methadone Scn, Ur: NOT DETECTED
Opiate, Ur Screen: NOT DETECTED
Phencyclidine (PCP) Ur S: NOT DETECTED
Tricyclic, Ur Screen: NOT DETECTED

## 2020-01-19 LAB — ETHANOL: Alcohol, Ethyl (B): <10 mg/dL (ref <10)

## 2020-01-19 LAB — COMPREHENSIVE METABOLIC PANEL
ALT: 14 U/L (ref 0–44)
AST: 16 U/L (ref 15–41)
Albumin: 4.2 g/dL (ref 3.5–5.0)
Alkaline Phosphatase: 50 U/L (ref 38–126)
Anion gap: 10 (ref 5–15)
BUN: 13 mg/dL (ref 6–20)
CO2: 27 mmol/L (ref 22–32)
Calcium: 9.2 mg/dL (ref 8.9–10.3)
Chloride: 105 mmol/L (ref 98–111)
Creatinine, Ser: 0.85 mg/dL (ref 0.44–1.00)
GFR, Estimated: >60 mL/min (ref >60)
Glucose, Bld: 88 mg/dL (ref 70–99)
Potassium: 3.9 mmol/L (ref 3.5–5.1)
Sodium: 142 mmol/L (ref 135–145)
Total Bilirubin: 0.6 mg/dL (ref 0.3–1.2)
Total Protein: 7.7 g/dL (ref 6.5–8.1)

## 2020-01-19 LAB — CBC
HCT: 44.8 % (ref 36.0–46.0)
Hemoglobin: 15.1 g/dL — ABNORMAL HIGH (ref 12.0–15.0)
MCH: 31 pg (ref 26.0–34.0)
MCHC: 33.7 g/dL (ref 30.0–36.0)
MCV: 92 fL (ref 80.0–100.0)
Platelets: 306 10*3/uL (ref 150–400)
RBC: 4.87 MIL/uL (ref 3.87–5.11)
RDW: 12.4 % (ref 11.5–15.5)
WBC: 6.7 10*3/uL (ref 4.0–10.5)
nRBC: 0 % (ref 0.0–0.2)

## 2020-01-19 LAB — PREGNANCY, URINE: Preg Test, Ur: NEGATIVE

## 2020-01-19 LAB — SALICYLATE LEVEL: Salicylate Lvl: <7 mg/dL — ABNORMAL LOW (ref 7.0–30.0)

## 2020-01-19 LAB — ACETAMINOPHEN LEVEL: Acetaminophen (Tylenol), Serum: <10 ug/mL — ABNORMAL LOW (ref 10–30)

## 2020-01-19 MED ORDER — LURASIDONE HCL 40 MG PO TABS: 40.0000 mg | ORAL_TABLET | Freq: Every day | ORAL | Status: DC

## 2020-01-19 MED ORDER — ACETAMINOPHEN 325 MG PO TABS
650.0000 mg | ORAL_TABLET | Freq: Four times a day (QID) | ORAL | Status: DC | PRN
  Administered 2020-01-20 – 2020-01-28 (×6): 650 mg via ORAL
  Filled 2020-01-19 (×7): qty 2

## 2020-01-19 MED ORDER — CLONAZEPAM 1 MG PO TABS
1.0000 mg | ORAL_TABLET | Freq: Every day | ORAL | Status: DC
  Administered 2020-01-20: 1 mg via ORAL
  Filled 2020-01-19: qty 1

## 2020-01-19 MED ORDER — LURASIDONE HCL 40 MG PO TABS
40.0000 mg | ORAL_TABLET | Freq: Every day | ORAL | Status: DC
  Administered 2020-01-20 – 2020-01-23 (×4): 40 mg via ORAL
  Filled 2020-01-19 (×4): qty 1

## 2020-01-19 MED ORDER — CLONAZEPAM 1 MG PO TABS: 1.0000 mg | ORAL_TABLET | Freq: Every day | ORAL | Status: DC

## 2020-01-19 MED ORDER — AMOXICILLIN-POT CLAVULANATE 875-125 MG PO TABS
1.0000 | ORAL_TABLET | Freq: Two times a day (BID) | ORAL | Status: AC
  Administered 2020-01-20 – 2020-01-26 (×14): 1 via ORAL
  Filled 2020-01-19 (×14): qty 1

## 2020-01-19 MED ORDER — AMOXICILLIN-POT CLAVULANATE 875-125 MG PO TABS
1.0000 | ORAL_TABLET | Freq: Two times a day (BID) | ORAL | Status: DC
  Filled 2020-01-19 (×2): qty 1

## 2020-01-19 MED ORDER — ALUM & MAG HYDROXIDE-SIMETH 200-200-20 MG/5ML PO SUSP: 30.0000 mL | ORAL | Status: DC | PRN

## 2020-01-19 MED ORDER — MAGNESIUM HYDROXIDE 400 MG/5ML PO SUSP: 30.0000 mL | Freq: Every day | ORAL | Status: DC | PRN
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
## 2020-01-19 NOTE — ED Notes (Signed)
Pt. To BHU from ED ambulatory without difficulty, to room  BHU 1. Report from [facility]. Pt. Is alert and oriented, warm and dry in no distress. Pt. Denies SI, HI, and AVH. Pt. Calm and cooperative. Pt. Made aware of security cameras and Q15 minute rounds. Pt. Encouraged to let Nursing staff know of any concerns or needs.

## 2020-01-19 NOTE — ED Notes (Signed)
Report received from [facility]. Patient to to be transferred to [facility] 1.

## 2020-01-19 NOTE — ED Notes (Signed)
Snack and beverage given. 
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
## 2020-01-19 NOTE — ED Notes (Signed)
Report to include Situation, Background, Assessment, and Recommendations received from [facility]. Patient alert and oriented, warm and dry, in no acute distress. Patient reported SI and didn't contract for safety. Denied HI, AVH and pain. Patient made aware of Q15 minute rounds and Psychologist, counselling presence for their safety. Patient instructed to come to me with needs or concerns.

## 2020-01-19 NOTE — ED Notes (Signed)
Patient is transferred to [facility] via NT and officer. Report given to RN Selena Batten.
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
## 2020-01-19 NOTE — BH Assessment (Signed)
PATIENT BED AVAILABLE AFTER 10:30PM  Patient is to be admitted to [facility] BMU by Dr. Toni Amend.  Attending Physician will be Dr. Les Pou.   Patient has been assigned to room 305, by [facility] Charge Nurse [place].    ER staff is aware of the admission:  [facility] ER Secretary    Dr. Fanny Bien, ER MD   Selena Batten Patient's Nurse   Rosey Bath Patient Access.

## 2020-01-19 NOTE — ED Notes (Signed)

## 2020-01-19 NOTE — ED Notes (Signed)
1 pair tennis shoes, 1 pair white socks, 1 pair blue jeans, 1 black puffer jacket, 1 blue hoodie, 1 black t shirt, 1 pair earrings with clear shiny stones, 1 black hair tie, 1 cell phone, 1 bag with floral designs.   Pt dressed out by this RN and Faith, EDT.

## 2020-01-20 ENCOUNTER — Encounter: Payer: Self-pay | Admitting: Psychiatry

## 2020-01-20 ENCOUNTER — Other Ambulatory Visit: Payer: Self-pay

## 2020-01-20 DIAGNOSIS — F315 Bipolar disorder, current episode depressed, severe, with psychotic features: Secondary | ICD-10-CM | POA: Diagnosis not present

## 2020-01-20 MED ORDER — HYDROXYZINE HCL 50 MG PO TABS
50.0000 mg | ORAL_TABLET | Freq: Four times a day (QID) | ORAL | Status: DC | PRN
  Administered 2020-01-20 – 2020-01-30 (×25): 50 mg via ORAL
  Filled 2020-01-20 (×28): qty 1

## 2020-01-20 MED ORDER — TRAZODONE HCL 100 MG PO TABS
100.0000 mg | ORAL_TABLET | Freq: Every day | ORAL | Status: DC
  Administered 2020-01-20 – 2020-01-22 (×3): 100 mg via ORAL
  Filled 2020-01-20 (×4): qty 1

## 2020-01-20 MED ORDER — NICOTINE 14 MG/24HR TD PT24
14.0000 mg | MEDICATED_PATCH | Freq: Every day | TRANSDERMAL | Status: DC
  Administered 2020-01-20 – 2020-01-30 (×11): 14 mg via TRANSDERMAL
  Filled 2020-01-20 (×11): qty 1

## 2020-01-20 NOTE — Plan of Care (Signed)
Patient alert and oriented x 4, affect is flat and sad, her thoughts are organized and coherent , she appears anxious and worried, patient was offered emotional support will continue to monitor .

## 2020-01-20 NOTE — Plan of Care (Signed)
Patient compliant with medications and rules of the unit  Problem: Education: Goal: Knowledge of the prescribed therapeutic regimen will improve Outcome: Progressing

## 2020-01-20 NOTE — Progress Notes (Signed)
Recreation Therapy Notes  INPATIENT RECREATION THERAPY ASSESSMENT  Patient Details Name: Marcia West MRN: [id number] DOB: 06-29-1985 Today's Date: 01/20/2020       Information Obtained From: Patient  Able to Participate in Assessment/Interview: Yes  Patient Presentation: Responsive  Reason for Admission (Per Patient): Active Symptoms  Patient Stressors:    Coping Skills:   Other (Comment),Substance Abuse ([place])  Leisure Interests (2+):  Individual - Reading  Frequency of Recreation/Participation: Monthly  Awareness of Community Resources:  Yes  Community Resources:  Other (Comment) (AA)  Current Use:    If no, Barriers?:    Expressed Interest in [employer] Information: No  [place]idence[place]  Patient Main Form of Transportation: Car  Patient Strengths:  N/A  Patient Identified Areas of Improvement:  Feel more alive  Patient Goal for Hospitalization:  Get into a substance abuse program  Current SI (including self-harm):  Yes (No plan)  Current HI:  No  Current AVH: No  Staff Intervention Plan: Group Attendance,Collaborate with Interdisciplinary Treatment Team  Consent to Intern Participation: N/A  Kegan Shepardson 01/20/2020, 10:58 AM

## 2020-01-20 NOTE — Plan of Care (Signed)
D- Patient alert and oriented. Patient presented in a pleasant mpod on assessment reporting that she slept poorly last night and was asking if she could get some medication to help with sleep tonight. She also endorsed depression, anxiety, and hopelessness on her self-inventory, however, she did not go into detail as to what was making her feel this way. Patient endorsed passive SI, without a plan, and from what this writer interprets, it seems to be because of everything that he has endured the past few weeks. Patient contracts for safety and feels safe on the unit. Patient denies HI/AVH at this time. Patient stated in treatment team, that her goals while here, are that she wants long-term treatment.  A- Scheduled medications administered to patient, per MD orders. Support and encouragement provided.  Routine safety checks conducted every 15 minutes.  Patient informed to notify staff with problems or concerns.  R- No adverse drug reactions noted. Patient contracts for safety at this time. Patient compliant with medications and treatment plan. Patient receptive, calm, and cooperative. Patient interacts well with others on the unit.  Patient remains safe at this time.  Problem: Consults Goal: Concurrent Medical Patient Education Description: (See Patient Education Module for education specifics) Outcome: Not Progressing   Problem: [facility] Concurrent Medical Problem Goal: LTG-Pt will be physically stable and he/significant other Description: (Patient will be physically stable and he/significant other will be able to verbalize understanding of follow-up care and symptoms that would warrant further treatment) Outcome: Not Progressing Goal: STG-Vital signs will be within defined limits or stabilized Description: (STG- Vital signs will be within defined limits or stabilized for individual) Outcome: Not Progressing Goal: STG-Compliance with medication and/or treatment as ordered Description: (STG-Compliance  with medication and/or treatment as ordered by MD) Outcome: Not Progressing Goal: STG-Verbalize two symptoms that would warrant further Description: (STG-Verbalize two symptoms that would warrant further treatment) Outcome: Not Progressing Goal: STG-Patient will participate in management/stabilization Description: (STG-Patient will participate in management/stabilization of medical condition) Outcome: Not Progressing Goal: STG-Other (Specify): Description: STG-Other Concurrent Medical (Specify): Outcome: Not Progressing   Problem: Education: Goal: Utilization of techniques to improve thought processes will improve Outcome: Not Progressing Goal: Knowledge of the prescribed therapeutic regimen will improve Outcome: Not Progressing   Problem: Activity: Goal: Interest or engagement in leisure activities will improve Outcome: Not Progressing Goal: Imbalance in normal sleep/wake cycle will improve Outcome: Not Progressing   Problem: Coping: Goal: Coping ability will improve Outcome: Not Progressing Goal: Will verbalize feelings Outcome: Not Progressing   Problem: Health Behavior/Discharge Planning: Goal: Ability to make decisions will improve Outcome: Not Progressing Goal: Compliance with therapeutic regimen will improve Outcome: Not Progressing   Problem: Role Relationship: Goal: Will demonstrate positive changes in social behaviors and relationships Outcome: Not Progressing   Problem: Safety: Goal: Ability to disclose and discuss suicidal ideas will improve Outcome: Not Progressing Goal: Ability to identify and utilize support systems that promote safety will improve Outcome: Not Progressing   Problem: Self-Concept: Goal: Will verbalize positive feelings about self Outcome: Not Progressing Goal: Level of anxiety will decrease Outcome: Not Progressing   Problem: Education: Goal: Ability to state activities that reduce stress will improve Outcome: Not Progressing    Problem: Coping: Goal: Ability to identify and develop effective coping behavior will improve Outcome: Not Progressing   Problem: Self-Concept: Goal: Ability to identify factors that promote anxiety will improve Outcome: Not Progressing Goal: Level of anxiety will decrease Outcome: Not Progressing Goal: Ability to modify response to factors that promote anxiety  will improve Outcome: Not Progressing
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
## 2020-01-20 NOTE — [facility] Suicide Risk Assessment (Signed)
[facility] INPATIENT:  Family/Significant Other Suicide Prevention Education  Suicide Prevention Education:  Patient Refusal for Family/Significant Other Suicide Prevention Education: The patient Marcia West has refused to provide written consent for family/significant other to be provided Family/Significant Other Suicide Prevention Education during admission and/or prior to discharge.  Physician notified.  SPE completed with pt, as pt refused to consent to family contact. SPI pamphlet provided to pt and pt was encouraged to share information with support network, ask questions, and talk about any concerns relating to SPE. Pt denies access to guns/firearms and verbalized understanding of information provided. Mobile Crisis information also provided to pt.  Glenis Smoker 01/20/2020, 1:07 PM

## 2020-01-20 NOTE — Tx Team (Signed)
Interdisciplinary Treatment and Diagnostic Plan Update  01/20/2020 Time of Session: 10:00AM Marcia West MRN: [id number]  Principal Diagnosis: <principal problem not specified>  Secondary Diagnoses: Active Problems:   Bipolar affective disorder, depressed, severe, with psychotic behavior ([place])   Current Medications:  Current Facility-Administered Medications  Medication Dose Route Frequency Provider Last Rate Last Admin  . acetaminophen (TYLENOL) tablet 650 mg  650 mg Oral Q6H PRN Clapacs, Madie Reno, MD   650 mg at 01/20/20 0811  . alum & mag hydroxide-simeth (MAALOX/MYLANTA) 200-200-20 MG/5ML suspension 30 mL  30 mL Oral Q4H PRN Clapacs, John T, MD      . amoxicillin-clavulanate (AUGMENTIN) 875-125 MG per tablet 1 tablet  1 tablet Oral BID Clapacs, Madie Reno, MD   1 tablet at 01/20/20 [phone]  . clonazePAM (KLONOPIN) tablet 1 mg  1 mg Oral QHS Clapacs, John T, MD      . lurasidone (LATUDA) tablet 40 mg  40 mg Oral Q breakfast Clapacs, Madie Reno, MD   40 mg at 01/20/20 0811  . magnesium hydroxide (MILK OF MAGNESIA) suspension 30 mL  30 mL Oral Daily PRN Clapacs, John T, MD      . nicotine (NICODERM CQ - dosed in mg/24 hours) patch 14 mg  14 mg Transdermal Daily Clapacs, Madie Reno, MD       PTA Medications: Medications Prior to Admission  Medication Sig Dispense Refill Last Dose  . traZODone (DESYREL) 150 MG tablet Take 1 tablet (150 mg total) by mouth at bedtime as needed for sleep. 30 tablet 1 Past Month at Unknown time  . Buprenorphine HCl-Naloxone HCl 4-1 MG FILM Take 1/2 film daily SL. 15 each 1   . clonazePAM (KLONOPIN) 1 MG tablet Take 1 tablet (1 mg total) by mouth 2 (two) times daily. 60 tablet 1   . hydrOXYzine (ATARAX/VISTARIL) 25 MG tablet Take 1 tablet (25 mg total) by mouth 3 (three) times daily as needed for anxiety. 60 tablet 1   . lurasidone (LATUDA) 40 MG TABS tablet Take 1 tablet (40 mg total) by mouth daily with supper. 30 tablet 1     Patient Stressors: Financial  difficulties Medication change or noncompliance Substance abuse Traumatic event  Patient Strengths: Capable of independent living General fund of knowledge Motivation for treatment/growth  Treatment Modalities: Medication Management, Group therapy, Case management,  1 to 1 session with clinician, Psychoeducation, Recreational therapy.   Physician Treatment Plan for Primary Diagnosis: <principal problem not specified> Long Term Goal(s):     Short Term Goals:    Medication Management: Evaluate patient's response, side effects, and tolerance of medication regimen.  Therapeutic Interventions: 1 to 1 sessions, Unit Group sessions and Medication administration.  Evaluation of Outcomes: Not Met  Physician Treatment Plan for Secondary Diagnosis: Active Problems:   Bipolar affective disorder, depressed, severe, with psychotic behavior ([place])  Long Term Goal(s):     Short Term Goals:       Medication Management: Evaluate patient's response, side effects, and tolerance of medication regimen.  Therapeutic Interventions: 1 to 1 sessions, Unit Group sessions and Medication administration.  Evaluation of Outcomes: Not Met   RN Treatment Plan for Primary Diagnosis: <principal problem not specified> Long Term Goal(s): Knowledge of disease and therapeutic regimen to maintain health will improve  Short Term Goals: Ability to demonstrate self-control, Ability to participate in decision making will improve, Ability to disclose and discuss suicidal ideas, Ability to identify and develop effective coping behaviors will improve and Compliance with prescribed medications will improve  Medication Management: RN will administer medications as ordered by provider, will assess and evaluate patient's response and provide education to patient for prescribed medication. RN will report any adverse and/or side effects to prescribing provider.  Therapeutic Interventions: 1 on 1 counseling sessions,  Psychoeducation, Medication administration, Evaluate responses to treatment, Monitor vital signs and CBGs as ordered, Perform/monitor CIWA, COWS, AIMS and Fall Risk screenings as ordered, Perform wound care treatments as ordered.  Evaluation of Outcomes: Not Met   LCSW Treatment Plan for Primary Diagnosis: <principal problem not specified> Long Term Goal(s): Safe transition to appropriate next level of care at discharge, Engage patient in therapeutic group addressing interpersonal concerns.  Short Term Goals: Engage patient in aftercare planning with referrals and resources, Increase social support, Increase ability to appropriately verbalize feelings, Increase emotional regulation, Facilitate acceptance of mental health diagnosis and concerns, Facilitate patient progression through stages of change regarding substance use diagnoses and concerns, Identify triggers associated with mental health/substance abuse issues and Increase skills for wellness and recovery  Therapeutic Interventions: Assess for all discharge needs, 1 to 1 time with Social worker, Explore available resources and support systems, Assess for adequacy in community support network, Educate family and significant other(s) on suicide prevention, Complete Psychosocial Assessment, Interpersonal group therapy.  Evaluation of Outcomes: Not Met   Progress in Treatment: Attending groups: No. Participating in groups: No. Taking medication as prescribed: Yes. Toleration medication: Yes. Family/Significant other contact made: No, will contact:  when given permission. Patient understands diagnosis: Yes. Discussing patient identified problems/goals with staff: Yes. Medical problems stabilized or resolved: Yes. Denies suicidal/homicidal ideation: Yes. Issues/concerns per patient self-inventory: No. Other: None.  New problem(s) identified: No, Describe:  none.  New Short Term/Long Term Goal(s): detox, elimination of symptoms of  psychosis, medication management for mood stabilization; elimination of SI thoughts; development of comprehensive mental wellness/sobriety plan.  Patient Goals: "To get into long term treatment somewhere."  Discharge Plan or Barriers: CSW will assist patient with looking into further treatment.  Reason for Continuation of Hospitalization: Depression Medication stabilization Withdrawal symptoms  Estimated Length of Stay: 1-7 days  Attendees: Patient: Marcia West 01/20/2020 1:21 PM  Physician: Gonzella Lex, MD 01/20/2020 1:21 PM  Nursing: Lyda Kalata, RN 01/20/2020 1:21 PM  RN Care Manager: 01/20/2020 1:21 PM  Social Worker: Chalmers Guest. Guerry Bruin, MSW, LCSW, LCAS 01/20/2020 1:21 PM  Recreational Therapist:  01/20/2020 1:21 PM  Other:  01/20/2020 1:21 PM  Other:  01/20/2020 1:21 PM  Other: 01/20/2020 1:21 PM    Scribe for Treatment Team: Shirl Harris, LCSW 01/20/2020 1:21 PM

## 2020-01-20 NOTE — Progress Notes (Signed)
Recreation Therapy Notes   Date: 01/20/2020  Time: 9:30 am   Location: Craft room     Behavioral response: N/A   Intervention Topic: Communication      Discussion/Intervention: Patient did not attend group.   Clinical Observations/Feedback:  Patient did not attend group.   Kyser Wandel LRT/CTRS        Ellissa Ayo 01/20/2020 11:50 AM 

## 2020-01-20 NOTE — Progress Notes (Signed)
Recreation Therapy Notes  INPATIENT RECREATION TR PLAN  Patient Details Name: Marcia West MRN: [id number] DOB: Sep 15, 1985 Today's Date: 01/20/2020  Rec Therapy Plan Is patient appropriate for Therapeutic Recreation?: Yes Treatment times per week: at least 3 Estimated Length of Stay: 5-7 days TR Treatment/Interventions: Group participation (Comment)  Discharge Criteria Pt will be discharged from therapy if:: Discharged Treatment plan/goals/alternatives discussed and agreed upon by:: Patient/family  Discharge Summary     Estle Sabella 01/20/2020, 11:00 AM

## 2020-01-20 NOTE — Progress Notes (Signed)
Patient calm and cooperative during assessment denying HI/AVH. Patient endorses passive SI but verbally contracts for safety. Patient isolative to her room but did come out for snack and medication. Patient observed interacting appropriately with staff and peers when active on the unit. Patient given education, support, and encouragement to be active in her treatment plan. Patient being monitored Q 15 minutes for safety per unit protocol. Pt remains safe on the unit. 

## 2020-01-20 NOTE — Tx Team (Signed)
Initial Treatment Plan 01/20/2020 12:27 AM [name] OFB:[id number]    PATIENT STRESSORS: Financial difficulties Medication change or noncompliance Substance abuse Traumatic event   PATIENT STRENGTHS: Capable of independent living General fund of knowledge Motivation for treatment/growth   PATIENT IDENTIFIED PROBLEMS: Bipolar Disorder     Substance Abuse     Anxiety              DISCHARGE CRITERIA:  Improved stabilization in mood, thinking, and/or behavior Motivation to continue treatment in a less acute level of care Safe-care adequate arrangements made  PRELIMINARY DISCHARGE PLAN: Outpatient therapy Placement in alternative living arrangements  PATIENT/FAMILY INVOLVEMENT: This treatment plan has been presented to and reviewed with the patient, Marcia West, The patient and family have been given the opportunity to ask questions and make suggestions.  Trula Ore, RN 01/20/2020, 12:27 AM

## 2020-01-20 NOTE — [facility] Suicide Risk Assessment (Signed)
[facility] Admission Suicide Risk Assessment   Nursing information obtained from:  Patient Demographic factors:  Adolescent or young adult,Low socioeconomic status Current Mental Status:  NA Loss Factors:  Loss of significant relationship Historical Factors:  Impulsivity Risk Reduction Factors:  Positive social support  Total Time spent with patient: 1 hour Principal Problem: Bipolar affective disorder, depressed, severe, with psychotic behavior ([facility]) Diagnosis:  Principal Problem:   Bipolar affective disorder, depressed, severe, with psychotic behavior ([facility]) Active Problems:   Anxiety and depression   Cocaine abuse ([facility])  Subjective Data: A 35 year old woman with a history of bipolar disorder admitted through the emergency room where she presented with confusion and depressed mood and anxiety passive suicidal ideation after an extended period of cocaine abuse and trauma.  Patient endorses suicidal ideation with no current intent or plan.  Behavior is cooperative  Continued Clinical Symptoms:  Alcohol Use Disorder Identification Test Final Score (AUDIT): 27 The "Alcohol Use Disorders Identification Test", Guidelines for Use in Primary Care, Second Edition.  World Science writer [facility]). Score between 0-7:  no or low risk or alcohol related problems. Score between 8-15:  moderate risk of alcohol related problems. Score between 16-19:  high risk of alcohol related problems. Score 20 or above:  warrants further diagnostic evaluation for alcohol dependence and treatment.   CLINICAL FACTORS:   Bipolar Disorder:   Mixed State Alcohol/Substance Abuse/Dependencies   Musculoskeletal: Strength & Muscle Tone: within normal limits Gait & Station: normal Patient leans: N/A  Psychiatric Specialty Exam: Physical Exam Vitals and nursing note reviewed.  Constitutional:      Appearance: She is well-developed and well-nourished.  HENT:     Head: Normocephalic and atraumatic.  Eyes:      Conjunctiva/sclera: Conjunctivae normal.     Pupils: Pupils are equal, round, and reactive to light.  Cardiovascular:     Heart sounds: Normal heart sounds.  Pulmonary:     Effort: Pulmonary effort is normal.  Abdominal:     Palpations: Abdomen is soft.  Musculoskeletal:        General: Normal range of motion.     Cervical back: Normal range of motion.  Skin:    General: Skin is warm and dry.  Neurological:     General: No focal deficit present.     Mental Status: She is alert.  Psychiatric:        Attention and Perception: She is inattentive.        Mood and Affect: Affect is blunt.        Speech: Speech is delayed.        Thought Content: Thought content includes suicidal ideation. Thought content does not include suicidal plan.        Cognition and Memory: Cognition is impaired.     Review of Systems  Constitutional: Negative.   HENT: Negative.   Eyes: Negative.   Respiratory: Negative.   Cardiovascular: Negative.   Gastrointestinal: Negative.   Musculoskeletal: Negative.   Skin: Negative.   Neurological: Negative.   Psychiatric/Behavioral: Positive for decreased concentration, dysphoric mood and suicidal ideas.    Blood pressure 124/84, pulse 70, temperature 98.4 F (36.9 C), temperature source Oral, resp. rate 17, height 5\' 5"  (1.651 m), weight 64.4 kg, last menstrual period 01/10/2020, SpO2 100 %, currently breastfeeding.Body mass index is 23.63 kg/m.  General Appearance: Casual  Eye Contact:  Minimal  Speech:  Slow  Volume:  Decreased  Mood:  Depressed  Affect:  Congruent  Thought Process:  Coherent  Orientation:  Full (Time, Place, and Person)  Thought Content:  Rumination  Suicidal Thoughts:  Yes.  without intent/plan  Homicidal Thoughts:  No  Memory:  Immediate;   Fair Recent;   Poor Remote;   Fair  Judgement:  Fair  Insight:  Fair  Psychomotor Activity:  Normal  Concentration:  Concentration: Fair  Recall:  [employer] of Knowledge:  Fair   Language:  Fair  Akathisia:  No  Handed:  Right  AIMS (if indicated):     Assets:  Desire for Improvement Resilience  ADL's:  Impaired  Cognition:  Impaired,  Mild  Sleep:  Number of Hours: 6      COGNITIVE FEATURES THAT CONTRIBUTE TO RISK:  Loss of executive function    SUICIDE RISK:   Mild:  Suicidal ideation of limited frequency, intensity, duration, and specificity.  There are no identifiable plans, no associated intent, mild dysphoria and related symptoms, good self-control (both objective and subjective assessment), few other risk factors, and identifiable protective factors, including available and accessible social support.  PLAN OF CARE: Continue 15-minute precautions.  Restart medicines used in the past for bipolar disorder.  As needed medicine for anxiety and sleep.  Engage in individual and group therapy and assessment with full treatment team.  Reassess dangerousness prior to discharge.  I certify that inpatient services furnished can reasonably be expected to improve the patient's condition.   Mordecai Rasmussen, MD 01/20/2020, 6:45 PM
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
## 2020-01-21 DIAGNOSIS — F315 Bipolar disorder, current episode depressed, severe, with psychotic features: Secondary | ICD-10-CM | POA: Diagnosis not present

## 2020-01-21 LAB — CHLAMYDIA/NGC RT PCR (ARMC ONLY)
Chlamydia Tr: NOT DETECTED
N gonorrhoeae: NOT DETECTED

## 2020-01-21 MED ORDER — ENSURE ENLIVE PO LIQD
237.0000 mL | Freq: Three times a day (TID) | ORAL | Status: DC
  Administered 2020-01-21 – 2020-01-30 (×19): 237 mL via ORAL

## 2020-01-21 MED ORDER — BISMUTH SUBSALICYLATE 262 MG PO CHEW
524.0000 mg | CHEWABLE_TABLET | ORAL | Status: DC | PRN
  Filled 2020-01-21: qty 2

## 2020-01-21 MED ORDER — CLONAZEPAM 0.5 MG PO TABS: 0.5000 mg | ORAL_TABLET | Freq: Every day | ORAL | Status: DC

## 2020-01-21 MED ORDER — TRAZODONE HCL 100 MG PO TABS
100.0000 mg | ORAL_TABLET | Freq: Every evening | ORAL | Status: DC | PRN
  Administered 2020-01-21 – 2020-01-22 (×2): 100 mg via ORAL
  Filled 2020-01-21: qty 1

## 2020-01-21 MED ORDER — CLONAZEPAM 0.5 MG PO TABS
0.5000 mg | ORAL_TABLET | Freq: Every day | ORAL | Status: AC
  Administered 2020-01-21 – 2020-01-22 (×2): 0.5 mg via ORAL
  Filled 2020-01-21 (×2): qty 1

## 2020-01-21 MED ORDER — BUSPIRONE HCL 5 MG PO TABS
5.0000 mg | ORAL_TABLET | Freq: Two times a day (BID) | ORAL | Status: DC
  Administered 2020-01-21 – 2020-01-22 (×2): 5 mg via ORAL
  Filled 2020-01-21 (×2): qty 1

## 2020-01-21 NOTE — Plan of Care (Signed)
Patient compliant with medications per MD orders and rules of the unit  Problem: Health Behavior/Discharge Planning: Goal: Compliance with therapeutic regimen will improve Outcome: Progressing

## 2020-01-21 NOTE — Progress Notes (Signed)
Patient calm and cooperative during assessment denying HI/AVH. Patient endorses passive SI but verbally contracts for safety. Patient isolative to her room but did come out for snack and medication. Patient observed interacting appropriately with staff and peers when active on the unit. Patient given education, support, and encouragement to be active in her treatment plan. Patient being monitored Q 15 minutes for safety per unit protocol. Pt remains safe on the unit. 
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
## 2020-01-21 NOTE — Progress Notes (Signed)
D: Pt alert and oriented. Pt rates depression 10/10, hopelessness 8/10, and anxiety 6/10. Pt goal: "Get into a long term program." Pt reports energy level as low and concentration as being poor. Pt reports sleep last night as being poor. Pt did receive medications for sleep and did find them helpful. Pt denies experiencing any pain at this time. Pt denies experiencing any SI/HI, or AVH at this time.   A: Scheduled medications administered to pt, per MD orders. Support and encouragement provided. Frequent verbal contact made. Routine safety checks conducted q15 minutes.   R: No adverse drug reactions noted. Pt verbally contracts for safety at this time. Pt complaint with medications. Pt interacts minimally with others on the unit. Pt remains safe at this time. Will continue to monitor.

## 2020-01-22 ENCOUNTER — Inpatient Hospital Stay: Payer: Medicaid Other

## 2020-01-22 DIAGNOSIS — F315 Bipolar disorder, current episode depressed, severe, with psychotic features: Secondary | ICD-10-CM | POA: Diagnosis not present

## 2020-01-22 LAB — HEPATITIS PANEL, ACUTE
HCV Ab: NONREACTIVE
Hep A IgM: NONREACTIVE
Hep B C IgM: NONREACTIVE
Hepatitis B Surface Ag: NONREACTIVE

## 2020-01-22 LAB — RPR: RPR Ser Ql: NONREACTIVE

## 2020-01-22 LAB — HIV ANTIBODY (ROUTINE TESTING W REFLEX): HIV Screen 4th Generation wRfx: NONREACTIVE

## 2020-01-22 MED ORDER — BUSPIRONE HCL 5 MG PO TABS
10.0000 mg | ORAL_TABLET | Freq: Three times a day (TID) | ORAL | Status: DC
  Administered 2020-01-22 – 2020-01-23 (×2): 10 mg via ORAL
  Filled 2020-01-22 (×2): qty 2

## 2020-01-22 NOTE — Progress Notes (Incomplete)
Patient is pleasant and cooperative. She denies SI HI  AVH and pain at this encounter. She does continue to endorse depression and anxiety and reports "needing to get my shit together"

## 2020-01-22 NOTE — Plan of Care (Signed)
D- Patient alert and oriented. Patient presents in a pleasant mood on assessment stating that she slept good last night, and she had complaints of something being in her throat. Patient reported that about a week ago, she had metal lodged in her throat and she feels like it's still there. Patient endorsed both depression and anxiety stating that she is "worried about being kicked out of here and not having anywhere to go". Patient denied SI, HI, AVH, and pain at this time. Patient's goal for today, again is to "get into a long-term program".  A- Scheduled medications administered to patient, per MD orders. Support and encouragement provided.  Routine safety checks conducted every 15 minutes.  Patient informed to notify staff with problems or concerns.  R- No adverse drug reactions noted. Patient contracts for safety at this time. Patient compliant with medications and treatment plan. Patient receptive, calm, and cooperative. Patient interacts well with others on the unit.  Patient remains safe at this time.  Problem: Consults Goal: Concurrent Medical Patient Education Description: (See Patient Education Module for education specifics) Outcome: Progressing   Problem: [facility] Concurrent Medical Problem Goal: LTG-Pt will be physically stable and he/significant other Description: (Patient will be physically stable and he/significant other will be able to verbalize understanding of follow-up care and symptoms that would warrant further treatment) Outcome: Progressing Goal: STG-Vital signs will be within defined limits or stabilized Description: (STG- Vital signs will be within defined limits or stabilized for individual) Outcome: Progressing Goal: STG-Compliance with medication and/or treatment as ordered Description: (STG-Compliance with medication and/or treatment as ordered by MD) Outcome: Progressing Goal: STG-Verbalize two symptoms that would warrant further Description: (STG-Verbalize two symptoms  that would warrant further treatment) Outcome: Progressing Goal: STG-Patient will participate in management/stabilization Description: (STG-Patient will participate in management/stabilization of medical condition) Outcome: Progressing Goal: STG-Other (Specify): Description: STG-Other Concurrent Medical (Specify): Outcome: Progressing   Problem: Education: Goal: Utilization of techniques to improve thought processes will improve Outcome: Progressing Goal: Knowledge of the prescribed therapeutic regimen will improve Outcome: Progressing   Problem: Activity: Goal: Interest or engagement in leisure activities will improve Outcome: Progressing Goal: Imbalance in normal sleep/wake cycle will improve Outcome: Progressing   Problem: Coping: Goal: Coping ability will improve Outcome: Progressing Goal: Will verbalize feelings Outcome: Progressing   Problem: Health Behavior/Discharge Planning: Goal: Ability to make decisions will improve Outcome: Progressing Goal: Compliance with therapeutic regimen will improve Outcome: Progressing   Problem: Role Relationship: Goal: Will demonstrate positive changes in social behaviors and relationships Outcome: Progressing   Problem: Safety: Goal: Ability to disclose and discuss suicidal ideas will improve Outcome: Progressing Goal: Ability to identify and utilize support systems that promote safety will improve Outcome: Progressing   Problem: Self-Concept: Goal: Will verbalize positive feelings about self Outcome: Progressing Goal: Level of anxiety will decrease Outcome: Progressing   Problem: Education: Goal: Ability to state activities that reduce stress will improve Outcome: Progressing   Problem: Coping: Goal: Ability to identify and develop effective coping behavior will improve Outcome: Progressing   Problem: Self-Concept: Goal: Ability to identify factors that promote anxiety will improve Outcome: Progressing Goal: Level  of anxiety will decrease Outcome: Progressing Goal: Ability to modify response to factors that promote anxiety will improve Outcome: Progressing

## 2020-01-22 NOTE — Progress Notes (Signed)
Patient is pleasant and cooperative. She denies SI HI  AVH and pain at this encounter. She does continue to endorse depression and anxiety and reports "needing to get my shit together" She is med complaint and tolerated her medication without incident.  She was encouraged to contact staff with any concerns and remains under monitor with 15 minute safety checks.    Cleo Butler-Nicholson, LPN

## 2020-01-22 NOTE — [facility] Group Notes (Signed)
[facility] Group Notes: (Clinical Social Work)   01/21/2020       Type of Therapy:  Group Therapy   Participation Level:  Did Not Attend - was invited individually by Nurse/MHT and chose not to attend.   [name], LCSWA 01/22/2020  9:00 AM
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
## 2020-01-22 NOTE — [facility] Group Notes (Signed)
[facility] Group Notes: (Clinical Social Work)   01/22/2020      Type of Therapy:  Group Therapy   Participation Level:  Did Not Attend - was invited individually by Nurse/MHT and chose not to attend.   [name], LCSWA 01/22/2020  2:22 PM

## 2020-01-22 NOTE — Plan of Care (Signed)
  Problem: Consults Goal: Concurrent Medical Patient Education Description: (See Patient Education Module for education specifics) Outcome: Progressing   Problem: [facility] Concurrent Medical Problem Goal: LTG-Pt will be physically stable and he/significant other Description: (Patient will be physically stable and he/significant other will be able to verbalize understanding of follow-up care and symptoms that would warrant further treatment) Outcome: Progressing Goal: STG-Vital signs will be within defined limits or stabilized Description: (STG- Vital signs will be within defined limits or stabilized for individual) Outcome: Progressing Goal: STG-Compliance with medication and/or treatment as ordered Description: (STG-Compliance with medication and/or treatment as ordered by MD) Outcome: Progressing Goal: STG-Verbalize two symptoms that would warrant further Description: (STG-Verbalize two symptoms that would warrant further treatment) Outcome: Progressing Goal: STG-Patient will participate in management/stabilization Description: (STG-Patient will participate in management/stabilization of medical condition) Outcome: Progressing Goal: STG-Other (Specify): Description: STG-Other Concurrent Medical (Specify): Outcome: Progressing   Problem: Education: Goal: Utilization of techniques to improve thought processes will improve Outcome: Progressing Goal: Knowledge of the prescribed therapeutic regimen will improve Outcome: Progressing   Problem: Activity: Goal: Interest or engagement in leisure activities will improve Outcome: Progressing Goal: Imbalance in normal sleep/wake cycle will improve Outcome: Progressing   Problem: Coping: Goal: Coping ability will improve Outcome: Progressing Goal: Will verbalize feelings Outcome: Progressing   Problem: Health Behavior/Discharge Planning: Goal: Ability to make decisions will improve Outcome: Progressing Goal: Compliance with therapeutic  regimen will improve Outcome: Progressing   Problem: Role Relationship: Goal: Will demonstrate positive changes in social behaviors and relationships Outcome: Progressing   Problem: Safety: Goal: Ability to disclose and discuss suicidal ideas will improve Outcome: Progressing Goal: Ability to identify and utilize support systems that promote safety will improve Outcome: Progressing   Problem: Self-Concept: Goal: Will verbalize positive feelings about self Outcome: Progressing Goal: Level of anxiety will decrease Outcome: Progressing   Problem: Education: Goal: Ability to state activities that reduce stress will improve Outcome: Progressing   Problem: Coping: Goal: Ability to identify and develop effective coping behavior will improve Outcome: Progressing   Problem: Self-Concept: Goal: Ability to identify factors that promote anxiety will improve Outcome: Progressing Goal: Level of anxiety will decrease Outcome: Progressing Goal: Ability to modify response to factors that promote anxiety will improve Outcome: Progressing

## 2020-01-23 DIAGNOSIS — F315 Bipolar disorder, current episode depressed, severe, with psychotic features: Secondary | ICD-10-CM | POA: Diagnosis not present

## 2020-01-23 MED ORDER — TRAZODONE HCL 100 MG PO TABS
200.0000 mg | ORAL_TABLET | Freq: Every day | ORAL | Status: DC
  Administered 2020-01-23 – 2020-01-29 (×7): 200 mg via ORAL
  Filled 2020-01-23 (×7): qty 2

## 2020-01-23 MED ORDER — LURASIDONE HCL 40 MG PO TABS
60.0000 mg | ORAL_TABLET | Freq: Every day | ORAL | Status: DC
  Administered 2020-01-24 – 2020-01-30 (×7): 60 mg via ORAL
  Filled 2020-01-23 (×7): qty 2

## 2020-01-23 MED ORDER — BUSPIRONE HCL 5 MG PO TABS
15.0000 mg | ORAL_TABLET | Freq: Three times a day (TID) | ORAL | Status: DC
  Administered 2020-01-23 – 2020-01-30 (×22): 15 mg via ORAL
  Filled 2020-01-23 (×22): qty 3

## 2020-01-23 NOTE — [facility] Group Notes (Signed)
LCSW Group Therapy Note  01/23/2020 2:43 PM  Type of Therapy/Topic:  Group Therapy:  Emotion Regulation  Participation Level:  Did Not Attend   Description of Group:   The purpose of this group is to assist patients in learning to regulate negative emotions and experience positive emotions. Patients will be guided to discuss ways in which they have been vulnerable to their negative emotions. These vulnerabilities will be juxtaposed with experiences of positive emotions or situations, and patients will be challenged to use positive emotions to combat negative ones. Special emphasis will be placed on coping with negative emotions in conflict situations, and patients will process healthy conflict resolution skills.  Therapeutic Goals: 1. Patient will identify two positive emotions or experiences to reflect on in order to balance out negative emotions 2. Patient will label two or more emotions that they find the most difficult to experience 3. Patient will demonstrate positive conflict resolution skills through discussion and/or role plays  Summary of Patient Progress: Patient did not attend group despite encouraged participation.     Therapeutic Modalities:   Cognitive Behavioral Therapy Feelings Identification Dialectical Behavioral Therapy  Gwenevere Ghazi, MSW, Bryon Lions 01/23/2020 2:43 PM

## 2020-01-23 NOTE — Progress Notes (Signed)
Patient calm and cooperative during assessment denying HI/AVH. Patient endorses passive SI but verbally contracts for safety. Patient isolative to her room but did come out for snack and medication. Patient observed interacting appropriately with staff and peers when active on the unit. Patient given education, support, and encouragement to be active in her treatment plan. Patient being monitored Q 15 minutes for safety per unit protocol. Pt remains safe on the unit.

## 2020-01-23 NOTE — Plan of Care (Signed)
Patient complaint with medication administration per MD orders and procedures on the unit   Problem: Health Behavior/Discharge Planning: Goal: Compliance with therapeutic regimen will improve Outcome: Progressing
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
## 2020-01-23 NOTE — Progress Notes (Signed)
Recreation Therapy Notes   Date: 01/23/2020  Time: 9:30 am   Location: Craft room   Behavioral response: Appropriate  Intervention Topic: Coping Skills    Discussion/Intervention:  Group content on today was focused on coping skills. The group defined what coping skills are and when they normally use coping skills. Individuals described how they normally cope with thing and the coping skills they normally use. Patients expressed why it is important to cope with things and how not coping with things can affect you. The group participated in the intervention "My coping box" and made coping boxes while adding coping skills they could use in the future to the box. Clinical Observations/Feedback: Patient came to group late due to unknown reasons. Individual was social with peers and staff while participating in the intervention. Shaley Leavens LRT/CTRS         Meesha Sek 01/23/2020 11:24 AM

## 2020-01-23 NOTE — BHH Counselor (Signed)
CSW met with patient to discuss [facility] referral information briefly. She acknowledged that she knew about the $300 admission fee and stated that she did not have it. She stated that she would still like to be referred there. No other concerns expressed. Contact ended without incident.   Referral was faxed to [facility] at [phone]. CSW received printout that fax went through.   Chalmers Guest. Guerry Bruin, MSW, [place], [place] 01/23/2020 4:06 PM

## 2020-01-23 NOTE — Plan of Care (Signed)
D- Patient alert and oriented. Patient presents in a pleasant mood on assessment stating that she slept good last night and had no complaints to voice to this Clinical research associate. Patient continues to endorse both depression and anxiety, rating them a "7/10" and "8/10", reporting "nothing different". Patient stated yesterday that she feels as if she will be kicked out of here with no place to go. Patient denies SI, HI, AVH, and pain at this time. Patient's goal is to "get into a long-term program", in which she will "talk to social worker, I hope", in order to achieve her goal.  A- Scheduled medications administered to patient, per MD orders. Support and encouragement provided.  Routine safety checks conducted every 15 minutes.  Patient informed to notify staff with problems or concerns.  R- No adverse drug reactions noted. Patient contracts for safety at this time. Patient compliant with medications and treatment plan. Patient receptive, calm, and cooperative. Patient interacts well with others on the unit.  Patient remains safe at this time.  Problem: Consults Goal: Concurrent Medical Patient Education Description: (See Patient Education Module for education specifics) Outcome: Progressing   Problem: BHH Concurrent Medical Problem Goal: LTG-Pt will be physically stable and he/significant other Description: (Patient will be physically stable and he/significant other will be able to verbalize understanding of follow-up care and symptoms that would warrant further treatment) Outcome: Progressing Goal: STG-Vital signs will be within defined limits or stabilized Description: (STG- Vital signs will be within defined limits or stabilized for individual) Outcome: Progressing Goal: STG-Compliance with medication and/or treatment as ordered Description: (STG-Compliance with medication and/or treatment as ordered by MD) Outcome: Progressing Goal: STG-Verbalize two symptoms that would warrant further Description:  (STG-Verbalize two symptoms that would warrant further treatment) Outcome: Progressing Goal: STG-Patient will participate in management/stabilization Description: (STG-Patient will participate in management/stabilization of medical condition) Outcome: Progressing Goal: STG-Other (Specify): Description: STG-Other Concurrent Medical (Specify): Outcome: Progressing   Problem: Education: Goal: Utilization of techniques to improve thought processes will improve Outcome: Progressing Goal: Knowledge of the prescribed therapeutic regimen will improve Outcome: Progressing   Problem: Activity: Goal: Interest or engagement in leisure activities will improve Outcome: Progressing Goal: Imbalance in normal sleep/wake cycle will improve Outcome: Progressing   Problem: Coping: Goal: Coping ability will improve Outcome: Progressing Goal: Will verbalize feelings Outcome: Progressing   Problem: Health Behavior/Discharge Planning: Goal: Ability to make decisions will improve Outcome: Progressing Goal: Compliance with therapeutic regimen will improve Outcome: Progressing   Problem: Role Relationship: Goal: Will demonstrate positive changes in social behaviors and relationships Outcome: Progressing   Problem: Safety: Goal: Ability to disclose and discuss suicidal ideas will improve Outcome: Progressing Goal: Ability to identify and utilize support systems that promote safety will improve Outcome: Progressing   Problem: Self-Concept: Goal: Will verbalize positive feelings about self Outcome: Progressing Goal: Level of anxiety will decrease Outcome: Progressing   Problem: Education: Goal: Ability to state activities that reduce stress will improve Outcome: Progressing   Problem: Coping: Goal: Ability to identify and develop effective coping behavior will improve Outcome: Progressing   Problem: Self-Concept: Goal: Ability to identify factors that promote anxiety will  improve Outcome: Progressing Goal: Level of anxiety will decrease Outcome: Progressing Goal: Ability to modify response to factors that promote anxiety will improve Outcome: Progressing

## 2020-01-23 NOTE — BHH Counselor (Signed)
CSW met with patient to discuss aftercare substance use residential programs. Patient was agreeable with [facility] residential program. CSW will submit appropriate documentation for admission consideration. Patient provided written consent to disclose health information at this time.   Signed:  Durenda Hurt, MSW, [place], LCASA 01/23/2020 11:38 AM

## 2020-01-24 DIAGNOSIS — F315 Bipolar disorder, current episode depressed, severe, with psychotic features: Secondary | ICD-10-CM | POA: Diagnosis not present

## 2020-01-24 NOTE — Progress Notes (Signed)
Recreation Therapy Notes  Date: 01/24/2020  Time: 9:30 am   Location: Craft room   Behavioral response: Appropriate  Intervention Topic: Stress Management    Discussion/Intervention:  Group content on today was focused on stress. The group defined stress and way to cope with stress. Participants expressed how they know when they are stresses out. Individuals described the different ways they have to cope with stress. The group stated reasons why it is important to cope with stress. Patient explained what good stress is and some examples. The group participated in the intervention "Stress Management". Individual answered questions related to stress.  Clinical Observations/Feedback: Patient came to group and expressed that she manages her stress by deep breathing. She explained that managing stress is important so it does not get out of control. Individual was social with peers and staff while participating in the intervention.  Yarely Bebee LRT/CTRS         Kinslei Labine 01/24/2020 11:10 AM
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
## 2020-01-24 NOTE — Progress Notes (Signed)
D: Pt alert and oriented. Pt rates depression 5/10, hopelessness 7/10, and anxiety 8/10. Pt goal: "Find out about [facility] or and outpatient program." Pt reports energy level as fair and concentration as being poor. Pt reports sleep last night as being fair. Pt did receive medications for sleep and did find them helpful. Pt reports experiencing 6/10 headache pain, prn meds given. Pt denies experiencing any SI/HI, or AVH at this time, however endorses SI on self inventory. Pt verbally contracts for safety.   Pt is calm and cooperative.  A: Scheduled medications administered to pt, per MD orders. Support and encouragement provided. Frequent verbal contact made. Routine safety checks conducted q15 minutes.   R: No adverse drug reactions noted. Pt verbally contracts for safety at this time. Pt complaint with medications. Pt interacts well with others on the unit, however does spend much time in her room lying down/self isolating. Pt remains safe at this time. Will continue to monitor.

## 2020-01-24 NOTE — BHH Group Notes (Signed)
LCSW Group Therapy Note  01/24/2020 2:01 PM  Type of Therapy/Topic:  Group Therapy:  Feelings about Diagnosis  Participation Level:  Did Not Attend   Description of Group:   This group will allow patients to explore their thoughts and feelings about diagnoses they have received. Patients will be guided to explore their level of understanding and acceptance of these diagnoses. Facilitator will encourage patients to process their thoughts and feelings about the reactions of others to their diagnosis and will guide patients in identifying ways to discuss their diagnosis with significant others in their lives. This group will be process-oriented, with patients participating in exploration of their own experiences, giving and receiving support, and processing challenge from other group members.   Therapeutic Goals: 1. Patient will demonstrate understanding of diagnosis as evidenced by identifying two or more symptoms of the disorder 2. Patient will be able to express two feelings regarding the diagnosis 3. Patient will demonstrate their ability to communicate their needs through discussion and/or role play  Summary of Patient Progress: X  Therapeutic Modalities:   Cognitive Behavioral Therapy Brief Therapy Feelings Identification   Oren Barella R. Algis Greenhouse, MSW, LCSW, LCAS 01/24/2020 2:01 PM

## 2020-01-25 DIAGNOSIS — F315 Bipolar disorder, current episode depressed, severe, with psychotic features: Secondary | ICD-10-CM | POA: Diagnosis not present

## 2020-01-25 MED ORDER — LORAZEPAM 1 MG PO TABS
1.0000 mg | ORAL_TABLET | Freq: Once | ORAL | Status: AC
  Administered 2020-01-25: 1 mg via ORAL
  Filled 2020-01-25: qty 1
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
## 2020-01-25 NOTE — BHH Group Notes (Signed)
LCSW Group Therapy Note  01/25/2020 2:08 PM  Type of Therapy/Topic:  Group Therapy:  Emotion Regulation  Participation Level:  Did Not Attend   Description of Group:   The purpose of this group is to assist patients in learning to regulate negative emotions and experience positive emotions. Patients will be guided to discuss ways in which they have been vulnerable to their negative emotions. These vulnerabilities will be juxtaposed with experiences of positive emotions or situations, and patients will be challenged to use positive emotions to combat negative ones. Special emphasis will be placed on coping with negative emotions in conflict situations, and patients will process healthy conflict resolution skills.  Therapeutic Goals: 1. Patient will identify two positive emotions or experiences to reflect on in order to balance out negative emotions 2. Patient will label two or more emotions that they find the most difficult to experience 3. Patient will demonstrate positive conflict resolution skills through discussion and/or role plays  Summary of Patient Progress: X  Therapeutic Modalities:   Cognitive Behavioral Therapy Feelings Identification Dialectical Behavioral Therapy  Marcia West R. Algis Greenhouse, MSW, LCSW, LCAS 01/25/2020 2:08 PM

## 2020-01-25 NOTE — BHH Counselor (Signed)
CSW called [facility] to follow up regarding application. Angus Palms informed CSW that they have been discussing it and that they have decided to put a temporary hold on admissions to the female house. He suggested that pt apply to other programs if she needed immediate placement. No other concerns expressed. Contact ended without incident.   Vilma Meckel. Algis Greenhouse, MSW, LCSW, LCAS 01/25/2020 12:56 PM

## 2020-01-25 NOTE — Progress Notes (Signed)
Recreation Therapy Notes  Date: 01/25/2020  Time: 9:30 am   Location: Craft room   Behavioral response: Appropriate, Isolated   Intervention Topic: Self-care    Discussion/Intervention:  Group content today was focused on Self-Care. The group defined self-care and some positive ways they care for themselves. Individuals expressed ways and reasons why they neglected any self-care in the past. Patients described ways to improve self-care in the future. The group explained what could happen if they did not do any self-care activities at all. The group participated in the intervention "self-care assessment" where they had a chance to discover some of their weaknesses and strengths in self- care. Patient came up with a self-care plan to improve themselves in the future.  Clinical Observations/Feedback: Patient came to group late due to unknown reasons. Individual participated in the intervention. Kindel Rochefort LRT/CTRS         Chez Bulnes 01/25/2020 11:16 AM

## 2020-01-25 NOTE — Progress Notes (Signed)
Patient alert and oriented x 4, affect is flat and sad, her thoughts are organized and coherent , she appears less anxious, no distress noted ,he denies SI/HI/AVH, Interacting appropriately with peers and staff,  was offered emotional support will continue to monitor closely.  

## 2020-01-25 NOTE — Tx Team (Signed)
Interdisciplinary Treatment and Diagnostic Plan Update  01/25/2020 Time of Session: 8:30AM Marcia West MRN: [id number]  Principal Diagnosis: Bipolar affective disorder, depressed, severe, with psychotic behavior (HCC)  Secondary Diagnoses: Principal Problem:   Bipolar affective disorder, depressed, severe, with psychotic behavior (HCC) Active Problems:   Anxiety and depression   Cocaine abuse (HCC)   Current Medications:  Current Facility-Administered Medications  Medication Dose Route Frequency Provider Last Rate Last Admin  . acetaminophen (TYLENOL) tablet 650 mg  650 mg Oral Q6H PRN Clapacs, Jackquline Denmark, MD   650 mg at 01/21/20 0945  . alum & mag hydroxide-simeth (MAALOX/MYLANTA) 200-200-20 MG/5ML suspension 30 mL  30 mL Oral Q4H PRN Clapacs, John T, MD      . amoxicillin-clavulanate (AUGMENTIN) 875-125 MG per tablet 1 tablet  1 tablet Oral BID Clapacs, Jackquline Denmark, MD   1 tablet at 01/25/20 0825  . bismuth subsalicylate (PEPTO BISMOL) chewable tablet 524 mg  524 mg Oral Q4H PRN Jesse Sans, MD      . busPIRone (BUSPAR) tablet 15 mg  15 mg Oral TID Jesse Sans, MD   15 mg at 01/25/20 0825  . feeding supplement (ENSURE ENLIVE / ENSURE PLUS) liquid 237 mL  237 mL Oral TID BM Jesse Sans, MD   237 mL at 01/24/20 1321  . hydrOXYzine (ATARAX/VISTARIL) tablet 50 mg  50 mg Oral Q6H PRN Clapacs, Jackquline Denmark, MD   50 mg at 01/25/20 0825  . lurasidone (LATUDA) tablet 60 mg  60 mg Oral Q breakfast Jesse Sans, MD   60 mg at 01/25/20 0825  . magnesium hydroxide (MILK OF MAGNESIA) suspension 30 mL  30 mL Oral Daily PRN Clapacs, John T, MD      . nicotine (NICODERM CQ - dosed in mg/24 hours) patch 14 mg  14 mg Transdermal Daily Clapacs, Jackquline Denmark, MD   14 mg at 01/25/20 0827  . traZODone (DESYREL) tablet 200 mg  200 mg Oral QHS Jesse Sans, MD   200 mg at 01/24/20 2141   PTA Medications: Medications Prior to Admission  Medication Sig Dispense Refill Last Dose  . traZODone (DESYREL)  150 MG tablet Take 1 tablet (150 mg total) by mouth at bedtime as needed for sleep. 30 tablet 1 Past Month at Unknown time  . Buprenorphine HCl-Naloxone HCl 4-1 MG FILM Take 1/2 film daily SL. 15 each 1   . clonazePAM (KLONOPIN) 1 MG tablet Take 1 tablet (1 mg total) by mouth 2 (two) times daily. 60 tablet 1   . hydrOXYzine (ATARAX/VISTARIL) 25 MG tablet Take 1 tablet (25 mg total) by mouth 3 (three) times daily as needed for anxiety. 60 tablet 1   . lurasidone (LATUDA) 40 MG TABS tablet Take 1 tablet (40 mg total) by mouth daily with supper. 30 tablet 1     Patient Stressors: Financial difficulties Medication change or noncompliance Substance abuse Traumatic event  Patient Strengths: Capable of independent living General fund of knowledge Motivation for treatment/growth  Treatment Modalities: Medication Management, Group therapy, Case management,  1 to 1 session with clinician, Psychoeducation, Recreational therapy.   Physician Treatment Plan for Primary Diagnosis: Bipolar affective disorder, depressed, severe, with psychotic behavior (HCC) Long Term Goal(s): Improvement in symptoms so as ready for discharge Improvement in symptoms so as ready for discharge   Short Term Goals: Ability to verbalize feelings will improve Ability to disclose and discuss suicidal ideas Ability to demonstrate self-control will improve Ability to identify and develop effective coping  behaviors will improve Ability to maintain clinical measurements within normal limits will improve  Medication Management: Evaluate patient's response, side effects, and tolerance of medication regimen.  Therapeutic Interventions: 1 to 1 sessions, Unit Group sessions and Medication administration.  Evaluation of Outcomes: Progressing  Physician Treatment Plan for Secondary Diagnosis: Principal Problem:   Bipolar affective disorder, depressed, severe, with psychotic behavior (HCC) Active Problems:   Anxiety and  depression   Cocaine abuse (HCC)  Long Term Goal(s): Improvement in symptoms so as ready for discharge Improvement in symptoms so as ready for discharge   Short Term Goals: Ability to verbalize feelings will improve Ability to disclose and discuss suicidal ideas Ability to demonstrate self-control will improve Ability to identify and develop effective coping behaviors will improve Ability to maintain clinical measurements within normal limits will improve     Medication Management: Evaluate patient's response, side effects, and tolerance of medication regimen.  Therapeutic Interventions: 1 to 1 sessions, Unit Group sessions and Medication administration.  Evaluation of Outcomes: Progressing   RN Treatment Plan for Primary Diagnosis: Bipolar affective disorder, depressed, severe, with psychotic behavior (HCC) Long Term Goal(s): Knowledge of disease and therapeutic regimen to maintain health will improve  Short Term Goals: Ability to demonstrate self-control, Ability to participate in decision making will improve, Ability to disclose and discuss suicidal ideas, Ability to identify and develop effective coping behaviors will improve and Compliance with prescribed medications will improve  Medication Management: RN will administer medications as ordered by provider, will assess and evaluate patient's response and provide education to patient for prescribed medication. RN will report any adverse and/or side effects to prescribing provider.  Therapeutic Interventions: 1 on 1 counseling sessions, Psychoeducation, Medication administration, Evaluate responses to treatment, Monitor vital signs and CBGs as ordered, Perform/monitor CIWA, COWS, AIMS and Fall Risk screenings as ordered, Perform wound care treatments as ordered.  Evaluation of Outcomes: Progressing   LCSW Treatment Plan for Primary Diagnosis: Bipolar affective disorder, depressed, severe, with psychotic behavior (HCC) Long Term  Goal(s): Safe transition to appropriate next level of care at discharge, Engage patient in therapeutic group addressing interpersonal concerns.  Short Term Goals: Engage patient in aftercare planning with referrals and resources, Increase social support, Increase ability to appropriately verbalize feelings, Increase emotional regulation, Facilitate acceptance of mental health diagnosis and concerns, Facilitate patient progression through stages of change regarding substance use diagnoses and concerns, Identify triggers associated with mental health/substance abuse issues and Increase skills for wellness and recovery  Therapeutic Interventions: Assess for all discharge needs, 1 to 1 time with Social worker, Explore available resources and support systems, Assess for adequacy in community support network, Educate family and significant other(s) on suicide prevention, Complete Psychosocial Assessment, Interpersonal group therapy.  Evaluation of Outcomes: Progressing   Progress in Treatment: Attending groups: No. Participating in groups: No. Taking medication as prescribed: Yes. Toleration medication: Yes. Family/Significant other contact made: No, will contact:  when given permission. Patient understands diagnosis: Yes. Discussing patient identified problems/goals with staff: Yes. Medical problems stabilized or resolved: Yes. Denies suicidal/homicidal ideation: Yes. Issues/concerns per patient self-inventory: No. Other: None.  New problem(s) identified: No, Describe:  none.  New Short Term/Long Term Goal(s): detox, elimination of symptoms of psychosis, medication management for mood stabilization; elimination of SI thoughts; development of comprehensive mental wellness/sobriety plan. Update 01/25/20: No changes at this time.  Patient Goals: "To get into long term treatment somewhere." Update 01/25/20: No changes at this time.  Discharge Plan or Barriers: CSW will assist patient with  looking  into further treatment. Update 01/25/20: Application was submitted to [facility] CSW to follow up regarding placement.  Reason for Continuation of Hospitalization: Depression Medication stabilization Withdrawal symptoms  Estimated Length of Stay: 1-7 days  Attendees: Patient:  01/25/2020 9:34 AM  Physician: Les Pou, MD 01/25/2020 9:34 AM  Nursing:  01/25/2020 9:34 AM  RN Care Manager: 01/25/2020 9:34 AM  Social Worker: Vilma Meckel. Algis Greenhouse, MSW, [place], LCAS 01/25/2020 9:34 AM  Recreational Therapist:  01/25/2020 9:34 AM  Other: [name], MSW, LCSW 01/25/2020 9:34 AM  Other:  01/25/2020 9:34 AM  Other: 01/25/2020 9:34 AM    Scribe for Treatment Team: Glenis Smoker, LCSW 01/25/2020 9:34 AM

## 2020-01-25 NOTE — Plan of Care (Signed)
°  Problem: Group Participation °Goal: STG - Patient will engage in groups without prompting or encouragement from LRT x3 group sessions within 5 recreation therapy group sessions °Description: STG - Patient will engage in groups without prompting or encouragement from LRT x3 group sessions within 5 recreation therapy group sessions °Outcome: Progressing °  °

## 2020-01-25 NOTE — Progress Notes (Signed)
D: Pt alert and oriented. Pt rates depression 6/10, hopelessness 6/10, and anxiety 7/10. Pt goal: "Find out about [facility]." Pt reports energy level as low and concentration as being good. Pt reports sleep last night as being fair. Pt did receive medications for sleep and did find them helpful. Pt denies experiencing any pain at this time. Pt denies experiencing any SI/HI, or AVH at this time.   A: Scheduled medications administered to pt, per MD orders. Support and encouragement provided. Frequent verbal contact made. Routine safety checks conducted q15 minutes.   R: No adverse drug reactions noted. Pt verbally contracts for safety at this time. Pt complaint with medications and treatment plan. Pt interacts well with others on the unit. Pt remains safe at this time. Will continue to monitor.
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
## 2020-01-26 DIAGNOSIS — F315 Bipolar disorder, current episode depressed, severe, with psychotic features: Secondary | ICD-10-CM | POA: Diagnosis not present

## 2020-01-26 MED ORDER — CLONAZEPAM 0.5 MG PO TABS
0.5000 mg | ORAL_TABLET | Freq: Every day | ORAL | Status: DC
  Administered 2020-01-26 – 2020-01-30 (×5): 0.5 mg via ORAL
  Filled 2020-01-26 (×5): qty 1

## 2020-01-26 NOTE — Progress Notes (Signed)
Patient alert and oriented x 4, affect is flat and sad, her thoughts are organized and coherent , she appears less anxious, no distress noted ,he denies SI/HI/AVH, Interacting appropriately with peers and staff,  was offered emotional support will continue to monitor closely.

## 2020-01-26 NOTE — Progress Notes (Signed)
Patient has been battling high anxiety today. She has requested PRN medication twice during the shift, as well as, being scheduled an additional medication for anxiety. Patient continues to interact well with other members on the unit and remains safe at this time.

## 2020-01-26 NOTE — Progress Notes (Signed)
Recreation Therapy Notes  Date: 01/26/2020  Time: 9:30 am  Location: Craft room    Behavioral response: Appropriate   Intervention Topic: Animal Assisted Therapy   Discussion/Intervention:  Animal Assisted Therapy took place today during group.  Animal Assisted Therapy is the planned inclusion of an animal in a patient's treatment plan. The patients were able to engage in therapy with an animal during group. Participants were educated on what a service dog is and the different between a support dog and a service dog. Patient were informed on the many animal needs there are and how their needs are similar. Individuals were enlightened on the process to get a service animal or support animal. Patients got the opportunity to pet the animal and were offered emotional support from the animal and staff.  Clinical Observations/Feedback:  Patient came to group and was on topic and was focused on what peers and staff had to say. Participant shared their experiences and history with animals. She stated that she had a cat growing up. Individual was social with peers, staff and animal while participating in group.  Marna Weniger LRT/CTRS         Mathieu Schloemer 01/26/2020 11:42 AM

## 2020-01-26 NOTE — Plan of Care (Signed)
  Problem: Coping: Goal: Coping ability will improve Outcome: Progressing Goal: Will verbalize feelings Outcome: Progressing   Problem: Health Behavior/Discharge Planning: Goal: Ability to make decisions will improve Outcome: Progressing Goal: Compliance with therapeutic regimen will improve Outcome: Progressing   

## 2020-01-26 NOTE — Plan of Care (Signed)
D- Patient alert and oriented. Patient presents in an anxious, but pleasant mood on assessment stating that she slept good last night "once I fell asleep". Patient denies depression, but endorsed anxiety, stating "I don't know where I'm going and I've been fixated on my breathing and heart rate". Patient did request PRN medication from this writer. Patient also denies SI, HI, AVH, and pain at this time. Patient's goal for today is to "find out about outpatient program", in which she will "talk to [place]", in order to achieve her goal.  A- Scheduled medications administered to patient, per MD orders. Support and encouragement provided.  Routine safety checks conducted every 15 minutes.  Patient informed to notify staff with problems or concerns.  R- No adverse drug reactions noted. Patient contracts for safety at this time. Patient compliant with medications and treatment plan. Patient receptive, calm, and cooperative. Patient interacts well with others on the unit.  Patient remains safe at this time.  Problem: Consults Goal: Concurrent Medical Patient Education Description: (See Patient Education Module for education specifics) Outcome: Progressing   Problem: BHH Concurrent Medical Problem Goal: LTG-Pt will be physically stable and he/significant other Description: (Patient will be physically stable and he/significant other will be able to verbalize understanding of follow-up care and symptoms that would warrant further treatment) Outcome: Progressing Goal: STG-Vital signs will be within defined limits or stabilized Description: (STG- Vital signs will be within defined limits or stabilized for individual) Outcome: Progressing Goal: STG-Compliance with medication and/or treatment as ordered Description: (STG-Compliance with medication and/or treatment as ordered by MD) Outcome: Progressing Goal: STG-Verbalize two symptoms that would warrant further Description: (STG-Verbalize two symptoms that  would warrant further treatment) Outcome: Progressing Goal: STG-Patient will participate in management/stabilization Description: (STG-Patient will participate in management/stabilization of medical condition) Outcome: Progressing Goal: STG-Other (Specify): Description: STG-Other Concurrent Medical (Specify): Outcome: Progressing   Problem: Education: Goal: Utilization of techniques to improve thought processes will improve Outcome: Progressing Goal: Knowledge of the prescribed therapeutic regimen will improve Outcome: Progressing   Problem: Activity: Goal: Interest or engagement in leisure activities will improve Outcome: Progressing Goal: Imbalance in normal sleep/wake cycle will improve Outcome: Progressing   Problem: Coping: Goal: Coping ability will improve Outcome: Progressing Goal: Will verbalize feelings Outcome: Progressing   Problem: Health Behavior/Discharge Planning: Goal: Ability to make decisions will improve Outcome: Progressing Goal: Compliance with therapeutic regimen will improve Outcome: Progressing   Problem: Role Relationship: Goal: Will demonstrate positive changes in social behaviors and relationships Outcome: Progressing   Problem: Safety: Goal: Ability to disclose and discuss suicidal ideas will improve Outcome: Progressing Goal: Ability to identify and utilize support systems that promote safety will improve Outcome: Progressing   Problem: Self-Concept: Goal: Will verbalize positive feelings about self Outcome: Progressing Goal: Level of anxiety will decrease Outcome: Progressing   Problem: Education: Goal: Ability to state activities that reduce stress will improve Outcome: Progressing   Problem: Coping: Goal: Ability to identify and develop effective coping behavior will improve Outcome: Progressing   Problem: Self-Concept: Goal: Ability to identify factors that promote anxiety will improve Outcome: Progressing Goal: Level of  anxiety will decrease Outcome: Progressing Goal: Ability to modify response to factors that promote anxiety will improve Outcome: Progressing

## 2020-01-26 NOTE — B[facility] Group Notes (Signed)
LCSW Group Therapy Note  01/26/2020 3:48 PM  Type of Therapy/Topic:  Group Therapy:  Balance in Life  Participation Level:  Did Not Attend  Description of Group:    This group will address the concept of balance and how it feels and looks when one is unbalanced. Patients will be encouraged to process areas in their lives that are out of balance and identify reasons for remaining unbalanced. Facilitators will guide patients in utilizing problem-solving interventions to address and correct the stressor making their life unbalanced. Understanding and applying boundaries will be explored and addressed for obtaining and maintaining a balanced life. Patients will be encouraged to explore ways to assertively make their unbalanced needs known to significant others in their lives, using other group members and facilitator for support and feedback.  Therapeutic Goals: 1. Patient will identify two or more emotions or situations they have that consume much of in their lives. 2. Patient will identify signs/triggers that life has become out of balance:  3. Patient will identify two ways to set boundaries in order to achieve balance in their lives:  4. Patient will demonstrate ability to communicate their needs through discussion and/or role plays  Summary of Patient Progress: X  Therapeutic Modalities:   Cognitive Behavioral Therapy Solution-Focused Therapy Assertiveness Training  [name] MSW, LCSW 01/26/2020 3:48 PM
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
## 2020-01-26 NOTE — Progress Notes (Signed)
Patient pleasant and cooperative. Denies SI, HI, AVH. Endorses anxiety. Prn given with good relief. No other complaints voiced. Prn given for sleep. Pt isolative to room most of shift.  Encouragement and support provided. Safety checks maintained. Medications given as prescribed. Pt receptive and remains safe on unit with q 15 min checks.

## 2020-01-27 NOTE — Progress Notes (Signed)
Recreation Therapy Notes   Date: 01/27/2020  Time: 9:30 am   Location: Craft room   Behavioral response: Appropriate  Intervention Topic: Goals   Discussion/Intervention:  Group content on today was focused on goals. Patients described what goals are and how they define goals. Individuals expressed how they go about setting goals and reaching them. The group identified how important goals are and if they make short term goals to reach long term goals. Patients described how many goals they work on at a time and what affects them not reaching their goal. Individuals described how much time they put into planning and obtaining their goals. The group participated in the intervention "My Goal Board" and made personal goal boards to help them achieve their goal. Clinical Observations/Feedback: Patient came to group late and was not engaged in the group discussion. She participated in the activity with a flat mood.     Maryfrances Portugal LRT/CTRS         Marabeth Melland 01/27/2020 11:31 AM

## 2020-01-27 NOTE — BHH Counselor (Signed)
CSW faxed over demographics and H&P to [facility] per request (phone: [phone]/fax: [phone]). CSW received print out that fax was delivered.  Vilma Meckel. Algis Greenhouse, MSW, LCSW, LCAS 01/27/2020 3:03 PM

## 2020-01-27 NOTE — BHH Group Notes (Signed)
LCSW Group Therapy Note  01/27/2020 1:47 PM  Type of Therapy and Topic:  Group Therapy:  Feelings around Relapse and Recovery  Participation Level:  Minimal   Description of Group:    Patients in this group will discuss emotions they experience before and after a relapse. They will process how experiencing these feelings, or avoidance of experiencing them, relates to having a relapse. Facilitator will guide patients to explore emotions they have related to recovery. Patients will be encouraged to process which emotions are more powerful. They will be guided to discuss the emotional reaction significant others in their lives may have to their relapse or recovery. Patients will be assisted in exploring ways to respond to the emotions of others without this contributing to a relapse.  Therapeutic Goals: 1. Patient will identify two or more emotions that lead to a relapse for them 2. Patient will identify two emotions that result when they relapse 3. Patient will identify two emotions related to recovery 4. Patient will demonstrate ability to communicate their needs through discussion and/or role plays   Summary of Patient Progress: Patient was present for most of the time during the group. She was able to identify her feelings around relapse and recovery. She discussed her support system and their loss of trust due to continued relapse. She identified thoughts of her daughter and desire to reconnect with her as motivation for continued recovery. Patient's comments and responses were directly related to the discussion at hand.   Therapeutic Modalities:   Cognitive Behavioral Therapy Solution-Focused Therapy Assertiveness Training Relapse Prevention Therapy   Simona Huh R. Algis Greenhouse, MSW, LCSW, LCAS 01/27/2020 1:47 PM

## 2020-01-27 NOTE — Progress Notes (Signed)
D: Pt alert and oriented. Pt rates depression 2/10, hopelessness 2/10, and anxiety 9/10. Pt goal: "Getting anxiety under control." Pt reports energy level as low and concentration as being good. Pt reports sleep last night as being good. Pt did not receive medications for sleep. Pt denies experiencing any pain at this time. Pt denies experiencing any SI/HI, or AVH at this time.   A: Scheduled medications administered to pt, per MD orders. Support and encouragement provided. Frequent verbal contact made. Routine safety checks conducted q15 minutes.   R: No adverse drug reactions noted. Pt verbally contracts for safety at this time. Pt complaint with medications and treatment plan. Pt interacts well with others on the unit. Pt remains safe at this time. Will continue to monitor.
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
## 2020-01-28 NOTE — [facility] Group Notes (Signed)
LCSW Aftercare Discharge Planning Group Note   01/28/2020 1:10 PM   Type of Group and Topic: Psychoeducational Group:  Discharge Planning  Participation Level:  Active  Description of Group  Discharge planning group reviews patient's anticipated discharge plans and assists patients to anticipate and address any barriers to wellness/recovery in the community.  Suicide prevention education is reviewed with patients in group.   Therapeutic Goals 1. Patients will state their anticipated discharge plan and mental health aftercare 2. Patients will identify potential barriers to wellness in the community setting 3. Patients will engage in problem solving, solution focused discussion of ways to anticipate and address barriers to wellness/recovery   Summary of Patient Progress: Patient stated she has been doing much better. Patient spoke about not attending group last weekend because her anxiety was too high. Patient states that she is supposed to be discharged on Monday. Patient stated her mother will be picking her up and has an appointment on Tuesday with [facility]. Patient stated she also plans on applying for food stamps.     Therapeutic Modalities: Motivational Interviewing    [name], Theresia Majors 01/28/2020 2:41 PM

## 2020-01-28 NOTE — Progress Notes (Signed)
Patient is cooperative and pleasant. She is active on the unit watching TV and engaging with other peers.  She denies SI HI AVH and pain.  She endorses depression and anxiety at this encounter rating them both 2/10.  She is medication compliant and tolerated her meds without incident.  She is monitored every 15 minutes for safety and progresss and encouraged to come to staff with any concerns.    Cleo Butler-Nicholson, LPN

## 2020-01-28 NOTE — Plan of Care (Signed)
D- Patient alert and oriented. Patient presented in an anxious, but pleasant mood on assessment stating that she slept alright last night, with complaints of a headache. Patient rated her pain a "6/10", in which she requested pain medication from this Clinical research associate. Patient denied depression, however, she endorsed anxiety. Patient couldn't verbalize what exactly was making her feel this way, "I don't really know". Patient also denied SI, HI, AVH at this time. Patient had no stated goals for today.   A- Scheduled medications administered to patient, per MD orders. Support and encouragement provided.  Routine safety checks conducted every 15 minutes.  Patient informed to notify staff with problems or concerns.  R- No adverse drug reactions noted. Patient contracts for safety at this time. Patient compliant with medications and treatment plan. Patient receptive, calm, and cooperative. Patient interacts well with others on the unit.  Patient remains safe at this time.  Problem: Consults Goal: Concurrent Medical Patient Education Description: (See Patient Education Module for education specifics) Outcome: Progressing   Problem: [facility] Concurrent Medical Problem Goal: LTG-Pt will be physically stable and he/significant other Description: (Patient will be physically stable and he/significant other will be able to verbalize understanding of follow-up care and symptoms that would warrant further treatment) Outcome: Progressing Goal: STG-Vital signs will be within defined limits or stabilized Description: (STG- Vital signs will be within defined limits or stabilized for individual) Outcome: Progressing Goal: STG-Compliance with medication and/or treatment as ordered Description: (STG-Compliance with medication and/or treatment as ordered by MD) Outcome: Progressing Goal: STG-Verbalize two symptoms that would warrant further Description: (STG-Verbalize two symptoms that would warrant further treatment) Outcome:  Progressing Goal: STG-Patient will participate in management/stabilization Description: (STG-Patient will participate in management/stabilization of medical condition) Outcome: Progressing Goal: STG-Other (Specify): Description: STG-Other Concurrent Medical (Specify): Outcome: Progressing   Problem: Education: Goal: Utilization of techniques to improve thought processes will improve Outcome: Progressing Goal: Knowledge of the prescribed therapeutic regimen will improve Outcome: Progressing   Problem: Activity: Goal: Interest or engagement in leisure activities will improve Outcome: Progressing Goal: Imbalance in normal sleep/wake cycle will improve Outcome: Progressing   Problem: Coping: Goal: Coping ability will improve Outcome: Progressing Goal: Will verbalize feelings Outcome: Progressing   Problem: Health Behavior/Discharge Planning: Goal: Ability to make decisions will improve Outcome: Progressing Goal: Compliance with therapeutic regimen will improve Outcome: Progressing   Problem: Role Relationship: Goal: Will demonstrate positive changes in social behaviors and relationships Outcome: Progressing   Problem: Safety: Goal: Ability to disclose and discuss suicidal ideas will improve Outcome: Progressing Goal: Ability to identify and utilize support systems that promote safety will improve Outcome: Progressing   Problem: Self-Concept: Goal: Will verbalize positive feelings about self Outcome: Progressing Goal: Level of anxiety will decrease Outcome: Progressing   Problem: Education: Goal: Ability to state activities that reduce stress will improve Outcome: Progressing   Problem: Coping: Goal: Ability to identify and develop effective coping behavior will improve Outcome: Progressing   Problem: Self-Concept: Goal: Ability to identify factors that promote anxiety will improve Outcome: Progressing Goal: Level of anxiety will decrease Outcome:  Progressing Goal: Ability to modify response to factors that promote anxiety will improve Outcome: Progressing

## 2020-01-28 NOTE — Progress Notes (Signed)
[facility] MD Progress Note  01/28/2020 10:45 AM Marcia West  MRN:  [id number]   Principal Problem: Bipolar affective disorder, depressed, severe, with psychotic behavior (HCC) Diagnosis: Principal Problem:   Bipolar affective disorder, depressed, severe, with psychotic behavior (HCC) Active Problems:   Anxiety and depression   Cocaine abuse (HCC)  35 year old female with bipolar I disorder presented to emergency room voluntarily for worsening depression, psychosis, and suicidal ideations.   Interval History Patient was seen today for re-evaluation.  Nursing reports no events overnight. The patient has no issues with performing ADLs.  Patient has been medication compliant.    Subjective:  On assessment patient reports "I feel good". Reports feeling "not depressed, just anxiety is high". She reports not feeling suicidal anymore. She reports her sleep is "good". She has been participating in groups, and interacting with peers appropriately. She denies any physical complaints. She denies auditory/visual hallucinations and reports feeling safe here in the hospital. No current symptoms of withdrawal, no cravings to use substances on current dose of Suboxone. She is looking forward for her discharge on Monday.  Labs: no new results for review.  Total Time spent with patient: 20 minutes  Past Psychiatric History: see H&P   Past Medical History:  Past Medical History:  Diagnosis Date  . Anxiety   . Bipolar affective (HCC)   . Depression   . Herpes simplex type II infection   . Substance abuse [facility])     Past Surgical History:  Procedure Laterality Date  . NO PAST SURGERIES    . WISDOM TOOTH EXTRACTION     Family History:  Family History  Problem Relation Age of Onset  . Arthritis Maternal Grandmother   . Diabetes Maternal Grandmother   . Diabetes Maternal Grandfather   . Obesity Paternal Grandmother    Family Psychiatric  History: see H&P  Social History:  Social History    Substance and Sexual Activity  Alcohol Use Not Currently     Social History   Substance and Sexual Activity  Drug Use Not Currently  . Types: Methamphetamines, Other-see comments, Marijuana, "Crack" cocaine   Comment: suboxone daily    Social History   Socioeconomic History  . Marital status: Single    Spouse name: Not on file  . Number of children: Not on file  . Years of education: Not on file  . Highest education level: Not on file  Occupational History  . Not on file  Tobacco Use  . Smoking status: Current Every Day Smoker    Packs/day: 0.25    Types: Cigarettes  . Smokeless tobacco: Never Used  . Tobacco comment: 4 cigs per day  Vaping Use  . Vaping Use: Never used  Substance and Sexual Activity  . Alcohol use: Not Currently  . Drug use: Not Currently    Types: Methamphetamines, Other-see comments, Marijuana, "Crack" cocaine    Comment: suboxone daily  . Sexual activity: Not Currently    Birth control/protection: None  Other Topics Concern  . Not on file  Social History Narrative  . Not on file   Social Determinants of Health   Financial Resource Strain: Not on file  Food Insecurity: Not on file  Transportation Needs: Not on file  Physical Activity: Not on file  Stress: Not on file  Social Connections: Not on file   Additional Social History:                         Sleep: Good  Appetite:  Good  Current Medications: Current Facility-Administered Medications  Medication Dose Route Frequency Provider Last Rate Last Admin  . acetaminophen (TYLENOL) tablet 650 mg  650 mg Oral Q6H PRN Clapacs, Jackquline Denmark, MD   650 mg at 01/28/20 0811  . alum & mag hydroxide-simeth (MAALOX/MYLANTA) 200-200-20 MG/5ML suspension 30 mL  30 mL Oral Q4H PRN Clapacs, John T, MD      . bismuth subsalicylate (PEPTO BISMOL) chewable tablet 524 mg  524 mg Oral Q4H PRN Jesse Sans, MD      . busPIRone (BUSPAR) tablet 15 mg  15 mg Oral TID Jesse Sans, MD   15 mg  at 01/28/20 9678  . clonazePAM (KLONOPIN) tablet 0.5 mg  0.5 mg Oral Daily Jesse Sans, MD   0.5 mg at 01/28/20 9381  . feeding supplement (ENSURE ENLIVE / ENSURE PLUS) liquid 237 mL  237 mL Oral TID BM Jesse Sans, MD   237 mL at 01/27/20 2100  . hydrOXYzine (ATARAX/VISTARIL) tablet 50 mg  50 mg Oral Q6H PRN Clapacs, Jackquline Denmark, MD   50 mg at 01/28/20 0811  . lurasidone (LATUDA) tablet 60 mg  60 mg Oral Q breakfast Jesse Sans, MD   60 mg at 01/28/20 0175  . magnesium hydroxide (MILK OF MAGNESIA) suspension 30 mL  30 mL Oral Daily PRN Clapacs, John T, MD      . nicotine (NICODERM CQ - dosed in mg/24 hours) patch 14 mg  14 mg Transdermal Daily Clapacs, Jackquline Denmark, MD   14 mg at 01/28/20 0811  . traZODone (DESYREL) tablet 200 mg  200 mg Oral QHS Jesse Sans, MD   200 mg at 01/27/20 2200    Lab Results: No results found for this or any previous visit (from the past 48 hour(s)).  Blood Alcohol level:  Lab Results  Component Value Date   ETH <10 01/19/2020   ETH 19 (H) 11/23/2019    Metabolic Disorder Labs: No results found for: HGBA1C, MPG No results found for: PROLACTIN No results found for: CHOL, TRIG, HDL, CHOLHDL, VLDL, LDLCALC  Physical Findings: AIMS: Facial and Oral Movements Muscles of Facial Expression: None, normal Lips and Perioral Area: None, normal Jaw: None, normal Tongue: None, normal,Extremity Movements Upper (arms, wrists, hands, fingers): None, normal Lower (legs, knees, ankles, toes): None, normal, Trunk Movements Neck, shoulders, hips: None, normal, Overall Severity Severity of abnormal movements (highest score from questions above): None, normal Incapacitation due to abnormal movements: None, normal Patient's awareness of abnormal movements (rate only patient's report): No Awareness, Dental Status Current problems with teeth and/or dentures?: No Does patient usually wear dentures?: No  CIWA:  CIWA-Ar Total: 3 COWS:  COWS Total Score:  1  Musculoskeletal: Strength & Muscle Tone: within normal limits Gait & Station: normal Patient leans: Right  Psychiatric Specialty Exam: Physical Exam   Review of Systems   Blood pressure 122/81, pulse 91, temperature 97.9 F (36.6 C), temperature source Oral, resp. rate 17, height 5\' 5"  (1.651 m), weight 64.4 kg, last menstrual period 01/10/2020, SpO2 99 %, currently breastfeeding.Body mass index is 23.63 kg/m.  General Appearance: Casual  Eye Contact:  Good  Speech:  Normal Rate  Volume:  Normal  Mood:  "better, less depressed"  Affect:  Appropriate, Congruent and Constricted  Thought Process:  Coherent and Goal Directed  Orientation:  Full (Time, Place, and Person)  Thought Content:  Logical  Suicidal Thoughts:  No  Homicidal Thoughts:  No  Memory:  Immediate;   Fair Recent;   Fair Remote;   Fair  Judgement:  Fair  Insight:  Fair  Psychomotor Activity:  Normal  Concentration:  Concentration: Fair and Attention Span: Fair  Recall:  [employer] of Knowledge:  Fair  Language:  Fair  Akathisia:  No  Handed:  Right  AIMS (if indicated):     Assets:  Communication Skills Desire for Improvement Financial Resources/Insurance Housing Intimacy Resilience Social Support  ADL's:  Intact  Cognition:  WNL  Sleep:  Number of Hours: 6.75     Treatment Plan Summary: Daily contact with patient to assess and evaluate symptoms and progress in treatment and Medication management   Patient is a 35 year old female with the above-stated past psychiatric history who is seen in follow-up.  Chart reviewed. Patient discussed with nursing. Patient reports stable mood. Some remaining anxiety, not depressed, no unsafe thoughts. Her discharge is planned for Monday. No med changes made today.   Plan:  -continue inpatient psych admission; 15-minute checks; daily contact with patient to assess and evaluate symptoms and progress in treatment; psychoeducation.  -continue scheduled  medications: . busPIRone  15 mg Oral TID  . clonazePAM  0.5 mg Oral Daily  . feeding supplement  237 mL Oral TID BM  . lurasidone  60 mg Oral Q breakfast  . nicotine  14 mg Transdermal Daily  . traZODone  200 mg Oral QHS    -continue PRN medications.  acetaminophen, alum & mag hydroxide-simeth, bismuth subsalicylate, hydrOXYzine, magnesium hydroxide  -Pertinent Labs: HIV, RPR, G/C, and Hepatitis panel negative. X-ray of neck negative for radio-opaque foreign body or soft tissue swelling.    -Disposition: Estimated duration of hospitalization: likely d/c om Monday, 1/17. All necessary aftercare will be arranged prior to discharge.  -  I certify that the patient does need, on a daily basis, active treatment furnished directly by or requiring the supervision of inpatient psychiatric facility personnel.   Thalia Party, MD 01/28/2020, 10:45 AM

## 2020-01-28 NOTE — Plan of Care (Signed)
  Problem: Consults Goal: Concurrent Medical Patient Education Description: (See Patient Education Module for education specifics) Outcome: Progressing   Problem: [facility] Concurrent Medical Problem Goal: LTG-Pt will be physically stable and he/significant other Description: (Patient will be physically stable and he/significant other will be able to verbalize understanding of follow-up care and symptoms that would warrant further treatment) Outcome: Progressing Goal: STG-Vital signs will be within defined limits or stabilized Description: (STG- Vital signs will be within defined limits or stabilized for individual) Outcome: Progressing Goal: STG-Compliance with medication and/or treatment as ordered Description: (STG-Compliance with medication and/or treatment as ordered by MD) Outcome: Progressing Goal: STG-Verbalize two symptoms that would warrant further Description: (STG-Verbalize two symptoms that would warrant further treatment) Outcome: Progressing Goal: STG-Patient will participate in management/stabilization Description: (STG-Patient will participate in management/stabilization of medical condition) Outcome: Progressing Goal: STG-Other (Specify): Description: STG-Other Concurrent Medical (Specify): Outcome: Progressing   Problem: Education: Goal: Utilization of techniques to improve thought processes will improve Outcome: Progressing Goal: Knowledge of the prescribed therapeutic regimen will improve Outcome: Progressing   Problem: Activity: Goal: Interest or engagement in leisure activities will improve Outcome: Progressing Goal: Imbalance in normal sleep/wake cycle will improve Outcome: Progressing   Problem: Coping: Goal: Coping ability will improve Outcome: Progressing Goal: Will verbalize feelings Outcome: Progressing   Problem: Health Behavior/Discharge Planning: Goal: Ability to make decisions will improve Outcome: Progressing Goal: Compliance with therapeutic  regimen will improve Outcome: Progressing   Problem: Role Relationship: Goal: Will demonstrate positive changes in social behaviors and relationships Outcome: Progressing   Problem: Safety: Goal: Ability to disclose and discuss suicidal ideas will improve Outcome: Progressing Goal: Ability to identify and utilize support systems that promote safety will improve Outcome: Progressing   Problem: Self-Concept: Goal: Will verbalize positive feelings about self Outcome: Progressing Goal: Level of anxiety will decrease Outcome: Progressing   Problem: Education: Goal: Ability to state activities that reduce stress will improve Outcome: Progressing   Problem: Coping: Goal: Ability to identify and develop effective coping behavior will improve Outcome: Progressing   Problem: Self-Concept: Goal: Ability to identify factors that promote anxiety will improve Outcome: Progressing Goal: Level of anxiety will decrease Outcome: Progressing Goal: Ability to modify response to factors that promote anxiety will improve Outcome: Progressing   

## 2020-01-29 NOTE — Progress Notes (Signed)
Patient pleasant and cooperative. Complaints of headache and anxiety. Prns given with good relief. Denies SI, HI, AVH. Endorses anxiety. Most of shift in room. Out for meds and snack.  Encouragement and support provided. Safety checks maintained. Medications given as prescribed. Pt receptive and remains safe on unit with q 15 min checks.

## 2020-01-29 NOTE — Plan of Care (Signed)
  Problem: Education: Goal: Knowledge of the prescribed therapeutic regimen will improve Outcome: Progressing   Problem: Coping: Goal: Coping ability will improve Outcome: Progressing   Problem: Health Behavior/Discharge Planning: Goal: Compliance with therapeutic regimen will improve Outcome: Progressing   Problem: Safety: Goal: Ability to disclose and discuss suicidal ideas will improve Outcome: Progressing

## 2020-01-29 NOTE — Plan of Care (Signed)
D- Patient alert and oriented. Affect/mood. Patient presents in a pleasant mood on assessment stating that she slept good last night and had no complaints to voice to this Clinical research associate. Patient denies depression, but endorsed anxiety stating that "I'm hoping I get to go home tomorrow". Patient did request PRN medication for anxiety. Patient also stated that overall, she is feeling "pretty good". Patient also denies SI, HI, AVH, and pain at this time. Patient had no stated goals for today.  A- Scheduled medications administered to patient, per MD orders. Support and encouragement provided.  Routine safety checks conducted every 15 minutes.  Patient informed to notify staff with problems or concerns.  R- No adverse drug reactions noted. Patient contracts for safety at this time. Patient compliant with medications and treatment plan. Patient receptive, calm, and cooperative. Patient interacts well with others on the unit.  Patient remains safe at this time.  Problem: Consults Goal: Concurrent Medical Patient Education Description: (See Patient Education Module for education specifics) Outcome: Progressing   Problem: [facility] Concurrent Medical Problem Goal: LTG-Pt will be physically stable and he/significant other Description: (Patient will be physically stable and he/significant other will be able to verbalize understanding of follow-up care and symptoms that would warrant further treatment) Outcome: Progressing Goal: STG-Vital signs will be within defined limits or stabilized Description: (STG- Vital signs will be within defined limits or stabilized for individual) Outcome: Progressing Goal: STG-Compliance with medication and/or treatment as ordered Description: (STG-Compliance with medication and/or treatment as ordered by MD) Outcome: Progressing Goal: STG-Verbalize two symptoms that would warrant further Description: (STG-Verbalize two symptoms that would warrant further treatment) Outcome:  Progressing Goal: STG-Patient will participate in management/stabilization Description: (STG-Patient will participate in management/stabilization of medical condition) Outcome: Progressing Goal: STG-Other (Specify): Description: STG-Other Concurrent Medical (Specify): Outcome: Progressing   Problem: Education: Goal: Utilization of techniques to improve thought processes will improve Outcome: Progressing Goal: Knowledge of the prescribed therapeutic regimen will improve Outcome: Progressing   Problem: Activity: Goal: Interest or engagement in leisure activities will improve Outcome: Progressing Goal: Imbalance in normal sleep/wake cycle will improve Outcome: Progressing   Problem: Coping: Goal: Coping ability will improve Outcome: Progressing Goal: Will verbalize feelings Outcome: Progressing   Problem: Health Behavior/Discharge Planning: Goal: Ability to make decisions will improve Outcome: Progressing Goal: Compliance with therapeutic regimen will improve Outcome: Progressing   Problem: Role Relationship: Goal: Will demonstrate positive changes in social behaviors and relationships Outcome: Progressing   Problem: Safety: Goal: Ability to disclose and discuss suicidal ideas will improve Outcome: Progressing Goal: Ability to identify and utilize support systems that promote safety will improve Outcome: Progressing   Problem: Self-Concept: Goal: Will verbalize positive feelings about self Outcome: Progressing Goal: Level of anxiety will decrease Outcome: Progressing   Problem: Education: Goal: Ability to state activities that reduce stress will improve Outcome: Progressing   Problem: Coping: Goal: Ability to identify and develop effective coping behavior will improve Outcome: Progressing   Problem: Self-Concept: Goal: Ability to identify factors that promote anxiety will improve Outcome: Progressing Goal: Level of anxiety will decrease Outcome:  Progressing Goal: Ability to modify response to factors that promote anxiety will improve Outcome: Progressing

## 2020-01-29 NOTE — Progress Notes (Signed)
[facility] MD Progress Note  01/29/2020 10:59 AM Marcia West  MRN:  [id number]   Principal Problem: Bipolar affective disorder, depressed, severe, with psychotic behavior (HCC) Diagnosis: Principal Problem:   Bipolar affective disorder, depressed, severe, with psychotic behavior (HCC) Active Problems:   Anxiety and depression   Cocaine abuse (HCC)  35 year old female with bipolar I disorder presented to emergency room voluntarily for worsening depression, psychosis, and suicidal ideations.   Interval History Patient was seen today for re-evaluation.  Nursing reports no events overnight. The patient has no issues with performing ADLs.  Patient has been medication compliant.    Subjective:  On assessment patient reports "I feel good, I am ready to go tomorrow". Reports feeling "okay, my anxiety is high, but depression is good". She reports not feeling suicidal anymore. She reports good sleep. She has been participating in groups, and interacting with peers appropriately. She denies any physical complaints. She denies auditory/visual hallucinations and reports feeling safe here in the hospital. She is looking forward for her discharge on Monday.  Labs: no new results for review.  Total Time spent with patient: 20 minutes  Past Psychiatric History: see H&P   Past Medical History:  Past Medical History:  Diagnosis Date  . Anxiety   . Bipolar affective (HCC)   . Depression   . Herpes simplex type II infection   . Substance abuse [facility])     Past Surgical History:  Procedure Laterality Date  . NO PAST SURGERIES    . WISDOM TOOTH EXTRACTION     Family History:  Family History  Problem Relation Age of Onset  . Arthritis Maternal Grandmother   . Diabetes Maternal Grandmother   . Diabetes Maternal Grandfather   . Obesity Paternal Grandmother    Family Psychiatric  History: see H&P  Social History:  Social History   Substance and Sexual Activity  Alcohol Use Not Currently      Social History   Substance and Sexual Activity  Drug Use Not Currently  . Types: Methamphetamines, Other-see comments, Marijuana, "Crack" cocaine   Comment: suboxone daily    Social History   Socioeconomic History  . Marital status: Single    Spouse name: Not on file  . Number of children: Not on file  . Years of education: Not on file  . Highest education level: Not on file  Occupational History  . Not on file  Tobacco Use  . Smoking status: Current Every Day Smoker    Packs/day: 0.25    Types: Cigarettes  . Smokeless tobacco: Never Used  . Tobacco comment: 4 cigs per day  Vaping Use  . Vaping Use: Never used  Substance and Sexual Activity  . Alcohol use: Not Currently  . Drug use: Not Currently    Types: Methamphetamines, Other-see comments, Marijuana, "Crack" cocaine    Comment: suboxone daily  . Sexual activity: Not Currently    Birth control/protection: None  Other Topics Concern  . Not on file  Social History Narrative  . Not on file   Social Determinants of Health   Financial Resource Strain: Not on file  Food Insecurity: Not on file  Transportation Needs: Not on file  Physical Activity: Not on file  Stress: Not on file  Social Connections: Not on file   Additional Social History:                         Sleep: Good  Appetite:  Good  Current Medications: Current  Facility-Administered Medications  Medication Dose Route Frequency Provider Last Rate Last Admin  . acetaminophen (TYLENOL) tablet 650 mg  650 mg Oral Q6H PRN Clapacs, Jackquline Denmark, MD   650 mg at 01/28/20 2104  . alum & mag hydroxide-simeth (MAALOX/MYLANTA) 200-200-20 MG/5ML suspension 30 mL  30 mL Oral Q4H PRN Clapacs, John T, MD      . bismuth subsalicylate (PEPTO BISMOL) chewable tablet 524 mg  524 mg Oral Q4H PRN Jesse Sans, MD      . busPIRone (BUSPAR) tablet 15 mg  15 mg Oral TID Jesse Sans, MD   15 mg at 01/29/20 0810  . clonazePAM (KLONOPIN) tablet 0.5 mg  0.5 mg  Oral Daily Jesse Sans, MD   0.5 mg at 01/29/20 0810  . feeding supplement (ENSURE ENLIVE / ENSURE PLUS) liquid 237 mL  237 mL Oral TID BM Jesse Sans, MD   237 mL at 01/28/20 2106  . hydrOXYzine (ATARAX/VISTARIL) tablet 50 mg  50 mg Oral Q6H PRN Clapacs, Jackquline Denmark, MD   50 mg at 01/29/20 0811  . lurasidone (LATUDA) tablet 60 mg  60 mg Oral Q breakfast Jesse Sans, MD   60 mg at 01/29/20 0810  . magnesium hydroxide (MILK OF MAGNESIA) suspension 30 mL  30 mL Oral Daily PRN Clapacs, John T, MD      . nicotine (NICODERM CQ - dosed in mg/24 hours) patch 14 mg  14 mg Transdermal Daily Clapacs, Jackquline Denmark, MD   14 mg at 01/29/20 8657  . traZODone (DESYREL) tablet 200 mg  200 mg Oral QHS Jesse Sans, MD   200 mg at 01/28/20 2104    Lab Results: No results found for this or any previous visit (from the past 48 hour(s)).  Blood Alcohol level:  Lab Results  Component Value Date   ETH <10 01/19/2020   ETH 19 (H) 11/23/2019    Metabolic Disorder Labs: No results found for: HGBA1C, MPG No results found for: PROLACTIN No results found for: CHOL, TRIG, HDL, CHOLHDL, VLDL, LDLCALC  Physical Findings: AIMS: Facial and Oral Movements Muscles of Facial Expression: None, normal Lips and Perioral Area: None, normal Jaw: None, normal Tongue: None, normal,Extremity Movements Upper (arms, wrists, hands, fingers): None, normal Lower (legs, knees, ankles, toes): None, normal, Trunk Movements Neck, shoulders, hips: None, normal, Overall Severity Severity of abnormal movements (highest score from questions above): None, normal Incapacitation due to abnormal movements: None, normal Patient's awareness of abnormal movements (rate only patient's report): No Awareness, Dental Status Current problems with teeth and/or dentures?: No Does patient usually wear dentures?: No  CIWA:  CIWA-Ar Total: 3 COWS:  COWS Total Score: 1  Musculoskeletal: Strength & Muscle Tone: within normal limits Gait &  Station: normal Patient leans: Right  Psychiatric Specialty Exam: Physical Exam   Review of Systems   Blood pressure 134/88, pulse 86, temperature 98 F (36.7 C), temperature source Oral, resp. rate 17, height 5\' 5"  (1.651 m), weight 64.4 kg, last menstrual period 01/10/2020, SpO2 98 %, currently breastfeeding.Body mass index is 23.63 kg/m.  General Appearance: Casual  Eye Contact:  Good  Speech:  Normal Rate  Volume:  Normal  Mood:  "better, less depressed"  Affect:  Appropriate, Congruent and Constricted  Thought Process:  Coherent and Goal Directed  Orientation:  Full (Time, Place, and Person)  Thought Content:  Logical  Suicidal Thoughts:  No  Homicidal Thoughts:  No  Memory:  Immediate;   Fair Recent;  Fair Remote;   Fair  Judgement:  Fair  Insight:  Fair  Psychomotor Activity:  Normal  Concentration:  Concentration: Fair and Attention Span: Fair  Recall:  [employer] of Knowledge:  Fair  Language:  Fair  Akathisia:  No  Handed:  Right  AIMS (if indicated):     Assets:  Communication Skills Desire for Improvement Financial Resources/Insurance Housing Intimacy Resilience Social Support  ADL's:  Intact  Cognition:  WNL  Sleep:  Number of Hours: 7.45     Treatment Plan Summary: Daily contact with patient to assess and evaluate symptoms and progress in treatment and Medication management   Patient is a 35 year old female with the above-stated past psychiatric history who is seen in follow-up.  Chart reviewed. Patient discussed with nursing. Patient reports stable mood. Some remaining anxiety, not depressed, no unsafe thoughts. Her discharge is planned for Monday. No med changes made today.   Plan:  -continue inpatient psych admission; 15-minute checks; daily contact with patient to assess and evaluate symptoms and progress in treatment; psychoeducation.  -continue scheduled medications: . busPIRone  15 mg Oral TID  . clonazePAM  0.5 mg Oral Daily  .  feeding supplement  237 mL Oral TID BM  . lurasidone  60 mg Oral Q breakfast  . nicotine  14 mg Transdermal Daily  . traZODone  200 mg Oral QHS    -continue PRN medications.  acetaminophen, alum & mag hydroxide-simeth, bismuth subsalicylate, hydrOXYzine, magnesium hydroxide  -Pertinent Labs: HIV, RPR, G/C, and Hepatitis panel negative. X-ray of neck negative for radio-opaque foreign body or soft tissue swelling.    -Disposition: Estimated duration of hospitalization: likely d/c om Monday, 1/17. All necessary aftercare will be arranged prior to discharge.  -  I certify that the patient does need, on a daily basis, active treatment furnished directly by or requiring the supervision of inpatient psychiatric facility personnel.   Thalia Party, MD 01/29/2020, 10:59 AM

## 2020-01-30 MED ORDER — LURASIDONE HCL 60 MG PO TABS: 60.0000 mg | ORAL_TABLET | Freq: Every day | ORAL | 1 refills | Status: DC

## 2020-01-30 MED ORDER — BUSPIRONE HCL 15 MG PO TABS: 15.0000 mg | ORAL_TABLET | Freq: Three times a day (TID) | ORAL | 1 refills | Status: DC

## 2020-01-30 MED ORDER — HYDROXYZINE HCL 50 MG PO TABS: 50.0000 mg | ORAL_TABLET | Freq: Four times a day (QID) | ORAL | 1 refills | Status: DC | PRN

## 2020-01-30 MED ORDER — TRAZODONE HCL 100 MG PO TABS: 200.0000 mg | ORAL_TABLET | Freq: Every day | ORAL | 1 refills | Status: DC

## 2020-01-30 MED ORDER — CLONAZEPAM 0.5 MG PO TABS: 0.5000 mg | ORAL_TABLET | Freq: Every day | ORAL | 0 refills | Status: DC

## 2020-01-30 NOTE — Progress Notes (Signed)
Patient is pleasant and cooperative. She is active on the unit and engages well with other peers.  She denies SI  HI  AVH and pain at this encounter. She does indorse depression and anxiety and requested PRN medication to help with anxiety. Patient tolerated meds without incident. She is safe on the unit with 15 minute safety rounds and encouraged to seek staff with any concerns.    Cleo Butler-Nicholson, LPN

## 2020-01-30 NOTE — Progress Notes (Signed)
  [facility] Adult Case Management Discharge Plan :  Will you be returning to the same living situation after discharge:  No. At discharge, do you have transportation home?: Yes,  family to provide transportation.  Do you have the ability to pay for your medications: Yes,  [place] Medicaid Prepaid Health Plan/[place] Medicaid Healthy Blue.  Release of information consent forms completed and in the chart;  Patient's signature needed at discharge.  Patient to Follow up at:  Follow-up Information    [facility] Follow up on 01/31/2020.   Why: Your appointment is scheduled for 01/31/20 at 8:30am.  Contact information: [address] [place] [address] [phone]               Next level of care provider has access to [facility] Link:no  Safety Planning and Suicide Prevention discussed: Yes,  SPE completed with pt as collateral/family contact was declined  Have you used any form of tobacco in the last 30 days? (Cigarettes, Smokeless Tobacco, Cigars, and/or Pipes): Yes  Has patient been referred to the Quitline?: Patient refused referral  Patient has been referred for addiction treatment: Pt. refused referral  Glenis Smoker, LCSW 01/30/2020, 10:47 AM

## 2020-01-30 NOTE — Plan of Care (Signed)
  Problem: Consults Goal: Concurrent Medical Patient Education Description: (See Patient Education Module for education specifics) Outcome: Progressing   Problem: [facility] Concurrent Medical Problem Goal: LTG-Pt will be physically stable and he/significant other Description: (Patient will be physically stable and he/significant other will be able to verbalize understanding of follow-up care and symptoms that would warrant further treatment) Outcome: Progressing Goal: STG-Vital signs will be within defined limits or stabilized Description: (STG- Vital signs will be within defined limits or stabilized for individual) Outcome: Progressing Goal: STG-Compliance with medication and/or treatment as ordered Description: (STG-Compliance with medication and/or treatment as ordered by MD) Outcome: Progressing Goal: STG-Verbalize two symptoms that would warrant further Description: (STG-Verbalize two symptoms that would warrant further treatment) Outcome: Progressing Goal: STG-Patient will participate in management/stabilization Description: (STG-Patient will participate in management/stabilization of medical condition) Outcome: Progressing Goal: STG-Other (Specify): Description: STG-Other Concurrent Medical (Specify): Outcome: Progressing   Problem: Education: Goal: Utilization of techniques to improve thought processes will improve Outcome: Progressing Goal: Knowledge of the prescribed therapeutic regimen will improve Outcome: Progressing   Problem: Activity: Goal: Interest or engagement in leisure activities will improve Outcome: Progressing Goal: Imbalance in normal sleep/wake cycle will improve Outcome: Progressing   Problem: Coping: Goal: Coping ability will improve Outcome: Progressing Goal: Will verbalize feelings Outcome: Progressing   Problem: Health Behavior/Discharge Planning: Goal: Ability to make decisions will improve Outcome: Progressing Goal: Compliance with therapeutic  regimen will improve Outcome: Progressing   Problem: Role Relationship: Goal: Will demonstrate positive changes in social behaviors and relationships Outcome: Progressing   Problem: Safety: Goal: Ability to disclose and discuss suicidal ideas will improve Outcome: Progressing Goal: Ability to identify and utilize support systems that promote safety will improve Outcome: Progressing   Problem: Self-Concept: Goal: Will verbalize positive feelings about self Outcome: Progressing Goal: Level of anxiety will decrease Outcome: Progressing   Problem: Education: Goal: Ability to state activities that reduce stress will improve Outcome: Progressing   Problem: Coping: Goal: Ability to identify and develop effective coping behavior will improve Outcome: Progressing   Problem: Self-Concept: Goal: Ability to identify factors that promote anxiety will improve Outcome: Progressing Goal: Level of anxiety will decrease Outcome: Progressing Goal: Ability to modify response to factors that promote anxiety will improve Outcome: Progressing   

## 2020-01-30 NOTE — Progress Notes (Signed)
Patient ID: Marcia West, female   DOB: [birth year], 35 y.o.   MRN: [id number]   Discharge Note:  Patient denies SI/HI/AVH at this time. Discharge instructions, AVS, prescriptions, and transition record gone over with patient. Patient agrees to comply with medication management, follow-up visit, and outpatient therapy. Patient belongings returned to patient. Patient questions and concerns addressed and answered. Patient ambulatory off unit. Patient discharged to home with her brother-in-law.
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
## 2020-01-30 NOTE — Plan of Care (Signed)
D- Patient alert and oriented. Patient presented in an anxious, but pleasant mood on assessment stating that she slept good last night and had no complaints to voice to this Clinical research associate. Patient endorsed both depression and anxiety, stating that not knowing if she'll be able to go home today, has her feeling this way. Patient requested PRN anxiety medication from this writer. Patient denies SI, HI, AVH, and pain at this time. Patient's goal for today is "going home".   A- Scheduled medications administered to patient, per MD orders. Support and encouragement provided.  Routine safety checks conducted every 15 minutes.  Patient informed to notify staff with problems or concerns.  R- No adverse drug reactions noted. Patient contracts for safety at this time. Patient compliant with medications and treatment plan. Patient receptive, calm, and cooperative. Patient interacts well with others on the unit.  Patient remains safe at this time.  Problem: Consults Goal: Concurrent Medical Patient Education Description: (See Patient Education Module for education specifics) Outcome: Progressing   Problem: [facility] Concurrent Medical Problem Goal: LTG-Pt will be physically stable and he/significant other Description: (Patient will be physically stable and he/significant other will be able to verbalize understanding of follow-up care and symptoms that would warrant further treatment) Outcome: Progressing Goal: STG-Vital signs will be within defined limits or stabilized Description: (STG- Vital signs will be within defined limits or stabilized for individual) Outcome: Progressing Goal: STG-Compliance with medication and/or treatment as ordered Description: (STG-Compliance with medication and/or treatment as ordered by MD) Outcome: Progressing Goal: STG-Verbalize two symptoms that would warrant further Description: (STG-Verbalize two symptoms that would warrant further treatment) Outcome: Progressing Goal: STG-Patient  will participate in management/stabilization Description: (STG-Patient will participate in management/stabilization of medical condition) Outcome: Progressing Goal: STG-Other (Specify): Description: STG-Other Concurrent Medical (Specify): Outcome: Progressing   Problem: Education: Goal: Utilization of techniques to improve thought processes will improve Outcome: Progressing Goal: Knowledge of the prescribed therapeutic regimen will improve Outcome: Progressing   Problem: Activity: Goal: Interest or engagement in leisure activities will improve Outcome: Progressing Goal: Imbalance in normal sleep/wake cycle will improve Outcome: Progressing   Problem: Coping: Goal: Coping ability will improve Outcome: Progressing Goal: Will verbalize feelings Outcome: Progressing   Problem: Health Behavior/Discharge Planning: Goal: Ability to make decisions will improve Outcome: Progressing Goal: Compliance with therapeutic regimen will improve Outcome: Progressing   Problem: Role Relationship: Goal: Will demonstrate positive changes in social behaviors and relationships Outcome: Progressing   Problem: Safety: Goal: Ability to disclose and discuss suicidal ideas will improve Outcome: Progressing Goal: Ability to identify and utilize support systems that promote safety will improve Outcome: Progressing   Problem: Self-Concept: Goal: Will verbalize positive feelings about self Outcome: Progressing Goal: Level of anxiety will decrease Outcome: Progressing   Problem: Education: Goal: Ability to state activities that reduce stress will improve Outcome: Progressing   Problem: Coping: Goal: Ability to identify and develop effective coping behavior will improve Outcome: Progressing   Problem: Self-Concept: Goal: Ability to identify factors that promote anxiety will improve Outcome: Progressing Goal: Level of anxiety will decrease Outcome: Progressing Goal: Ability to modify response  to factors that promote anxiety will improve Outcome: Progressing
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
## 2020-01-31 ENCOUNTER — Telehealth: Payer: Self-pay

## 2020-01-31 DIAGNOSIS — F3111 Bipolar disorder, current episode manic without psychotic features, mild: Secondary | ICD-10-CM | POA: Diagnosis not present

## 2020-01-31 NOTE — Telephone Encounter (Signed)
Transition Care Management Unsuccessful Follow-up Telephone Call  Date of discharge and from where: 01/30/2020 [facility]  Attempts:  1st Attempt  Reason for unsuccessful TCM follow-up call:  Left voice message, call went straight to voicemail.

## 2020-02-01 NOTE — Telephone Encounter (Signed)
Transition Care Management Unsuccessful Follow-up Telephone Call  Date of discharge and from where:  01/30/2020 [facility]  Attempts:  2nd Attempt  Reason for unsuccessful TCM follow-up call:  Unable to leave message, message: number is un reachable.

## 2020-02-02 DIAGNOSIS — F3111 Bipolar disorder, current episode manic without psychotic features, mild: Secondary | ICD-10-CM | POA: Diagnosis not present

## 2020-02-02 NOTE — Telephone Encounter (Signed)
Transition Care Management Unsuccessful Follow-up Telephone Call  Date of discharge and from where:  1/17/2022[facility]   Attempts:  3rd Attempt  Reason for unsuccessful TCM follow-up call:  Unable to leave message

## 2020-02-06 ENCOUNTER — Encounter (INDEPENDENT_AMBULATORY_CARE_PROVIDER_SITE_OTHER): Payer: Self-pay

## 2020-03-26 DIAGNOSIS — F315 Bipolar disorder, current episode depressed, severe, with psychotic features: Secondary | ICD-10-CM | POA: Diagnosis not present

## 2020-04-19 DIAGNOSIS — F315 Bipolar disorder, current episode depressed, severe, with psychotic features: Secondary | ICD-10-CM | POA: Diagnosis not present

## 2020-05-22 DIAGNOSIS — F3111 Bipolar disorder, current episode manic without psychotic features, mild: Secondary | ICD-10-CM | POA: Diagnosis not present

## 2020-05-31 DIAGNOSIS — F315 Bipolar disorder, current episode depressed, severe, with psychotic features: Secondary | ICD-10-CM | POA: Diagnosis not present

## 2020-06-20 ENCOUNTER — Encounter: Payer: Self-pay | Admitting: Internal Medicine

## 2020-06-21 NOTE — Telephone Encounter (Signed)
The OUD clinic is booked for next week.  Can the patient wait until 07/03/2020 or can next weeks clinic be overbooked.  Please advise.

## 2020-07-10 ENCOUNTER — Ambulatory Visit (INDEPENDENT_AMBULATORY_CARE_PROVIDER_SITE_OTHER): Payer: Medicaid Other | Admitting: Internal Medicine

## 2020-07-10 ENCOUNTER — Other Ambulatory Visit: Payer: Self-pay

## 2020-07-10 DIAGNOSIS — F32A Depression, unspecified: Secondary | ICD-10-CM | POA: Diagnosis not present

## 2020-07-10 DIAGNOSIS — F419 Anxiety disorder, unspecified: Secondary | ICD-10-CM | POA: Diagnosis not present

## 2020-07-10 DIAGNOSIS — F1190 Opioid use, unspecified, uncomplicated: Secondary | ICD-10-CM

## 2020-07-10 DIAGNOSIS — F315 Bipolar disorder, current episode depressed, severe, with psychotic features: Secondary | ICD-10-CM | POA: Diagnosis not present

## 2020-07-10 MED ORDER — BUPRENORPHINE HCL-NALOXONE HCL 4-1 MG SL FILM
0.5000 | ORAL_FILM | Freq: Two times a day (BID) | SUBLINGUAL | 0 refills | Status: DC
Start: 2020-07-10 — End: 2020-08-16

## 2020-07-10 NOTE — Assessment & Plan Note (Signed)
Patient with history of anxiety, depression, and bipolar disorder presents to the clinic today to reestablish for ADD.  It was noted that on her GAD-7 she marked 1 for suicidality.  She was recently admitted in January 2022 for her anxiety and depression along with cocaine use.  Discussed her screening with patient, who denies active SI or HI at this time. - Continue BuSpar 15 mg 3 times daily - Continue Klonopin 0.5 mg daily - Continue trazodone 200 mg nightly - Continue lurasidone 60 mg daily with breakfast

## 2020-07-10 NOTE — Assessment & Plan Note (Signed)
Patient presents to clinic for reestablishment.  Daily.  She states that she was well controlled on Suboxone 2-0.5 mg daily.  She has had 1 episode of relapse since the last time she was here in November 2021.  We will prescribe Suboxone 4-1 mg half a film twice daily.  - Prescribed Suboxone 4-1 mg half a film twice daily. - Follow-up in 4 weeks time to reassess

## 2020-07-10 NOTE — Patient Instructions (Addendum)
Marcia West,   It was a pleasure meeting you today. Today we discussed you reestablishing with the clinic for your opioid use disorder. We will start you on the Suboxone films 4-1mg  films. You can cut them in half for your 2-0.5 mg dose. Please take them two times daily. We will reassess how you are doing in 4 weeks.  Dolan Amen, MD

## 2020-07-10 NOTE — Progress Notes (Signed)
   07/10/2020  Marcia West presents for follow up of opioid use disorder I have reviewed the prior induction visit, follow up visits, and telephone encounters relevant to opiate use disorder (OUD) treatment.   Current daily dose: Previously on Suboxone 2-0.5 mg daily  Date of Induction: 03/30/18  Current follow up interval, in weeks: 4-8 weeks  The patient has been adherent with the buprenorphine for OUD contract.   Last UDS Result: 11/23/19 cocaine, BZD, THC  HPI:  35 y/o F presents to the clinic to reestablish care for her OUD. She was previously on Suboxone 2-0.5 mg daily, but due to her low dose she weaned herself off. Her last OV was in 12/13/2019. She states that since her last visit, she stopped taking her Suboxone in January of this year.  She did have 1 relapse using cocaine and oral opiates.  She does have a small supply of her Suboxone that she uses for particularly difficulties to control her cravings.  She endorses daily cravings.  She denies any symptoms of nausea, vomiting, abdominal pain, diarrhea, or chills.  She would like to restart her Suboxone as that helps control her cravings. Exam:   Vitals:   07/10/20 0955  BP: 105/72  Pulse: 84  Temp: 97.9 F (36.6 C)  TempSrc: Oral  SpO2: 99%  Weight: 144 lb 9.6 oz (65.6 kg)  Height: 5\' 4"  (1.626 m)    Physical Exam Constitutional:      General: She is not in acute distress.    Appearance: Normal appearance. She is not ill-appearing, toxic-appearing or diaphoretic.  Cardiovascular:     Rate and Rhythm: Normal rate and regular rhythm.     Pulses: Normal pulses.     Heart sounds: Normal heart sounds. No murmur heard.   No friction rub. No gallop.  Pulmonary:     Effort: Pulmonary effort is normal.     Breath sounds: Normal breath sounds. No wheezing, rhonchi or rales.  Abdominal:     General: Abdomen is flat. Bowel sounds are normal.     Palpations: Abdomen is soft.  Neurological:     Mental Status: She is  alert.  Psychiatric:        Mood and Affect: Mood normal.        Behavior: Behavior normal.     Assessment/Plan:  See Problem Based Charting in the Encounters Tab     , MD  07/10/2020  10:01 AM

## 2020-08-06 NOTE — Progress Notes (Signed)
Internal Medicine Clinic Attending ? ?Case discussed with Dr. Winters  At the time of the visit.  We reviewed the resident?s history and exam and pertinent patient test results.  I agree with the assessment, diagnosis, and plan of care documented in the resident?s note.  ?

## 2020-08-13 IMAGING — US US MFM OB DETAIL+14 WK
1 series · 14 of 28 positions shown · non-contrast
Comparison: none

[Series 1: us mfm ob detail+14 wk · 72 acquisitions, 14 frames shown]
[im 3/72]
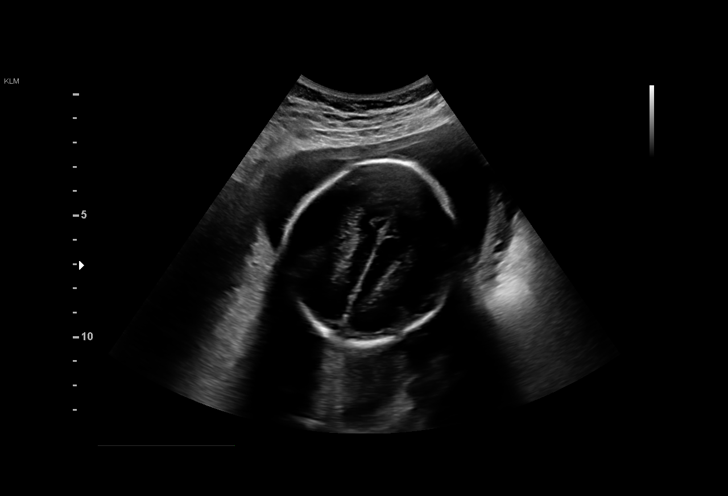
[im 8/72]
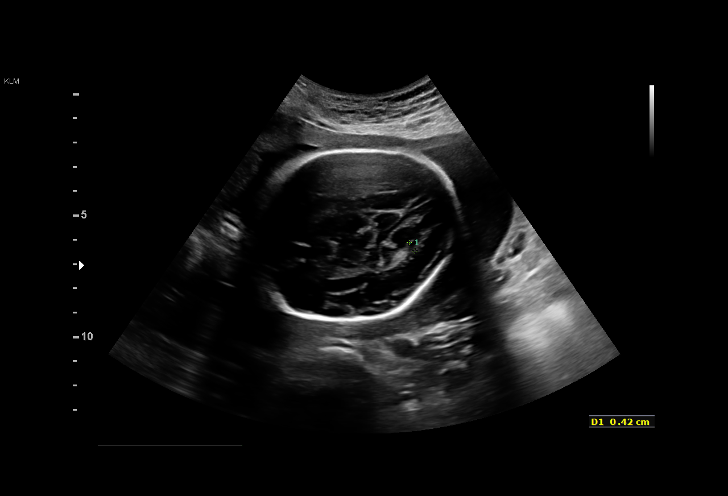
[im 14/72]
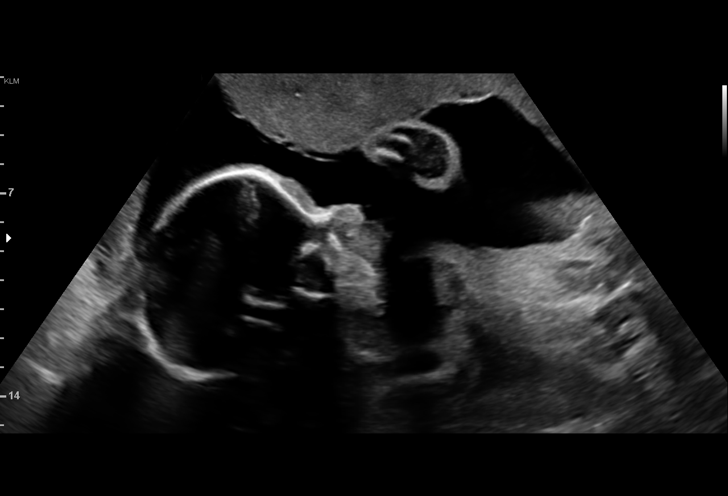
[im 19/72]
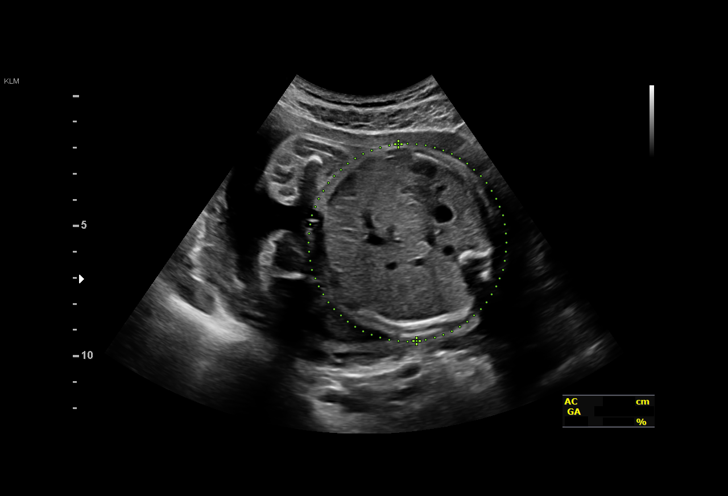
[im 24/72]
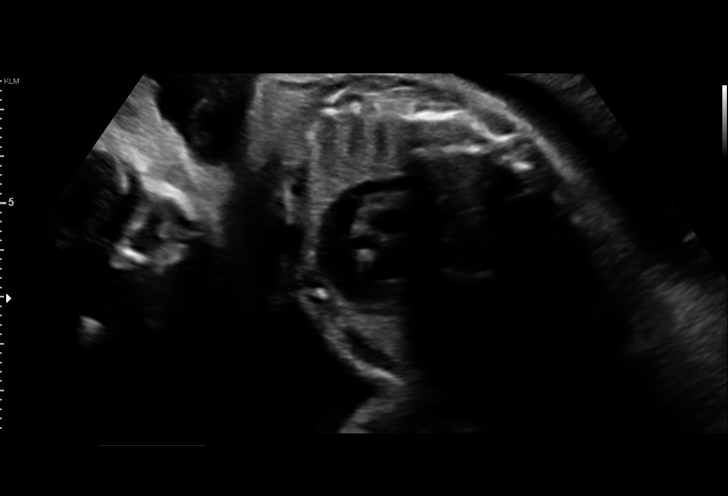
[im 29/72]
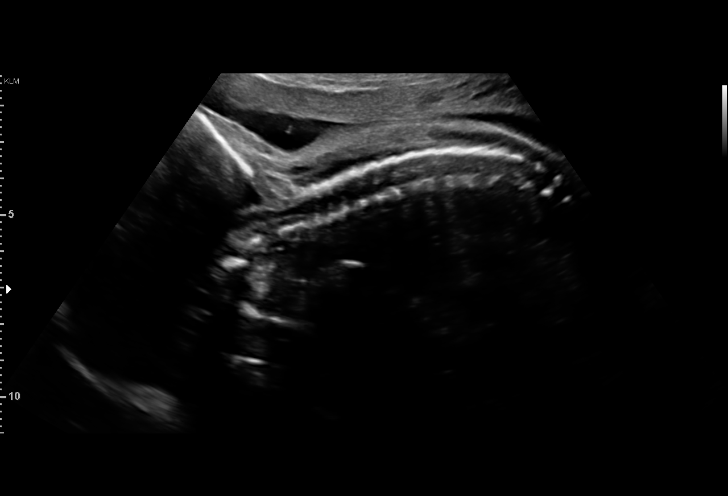
[im 35/72]
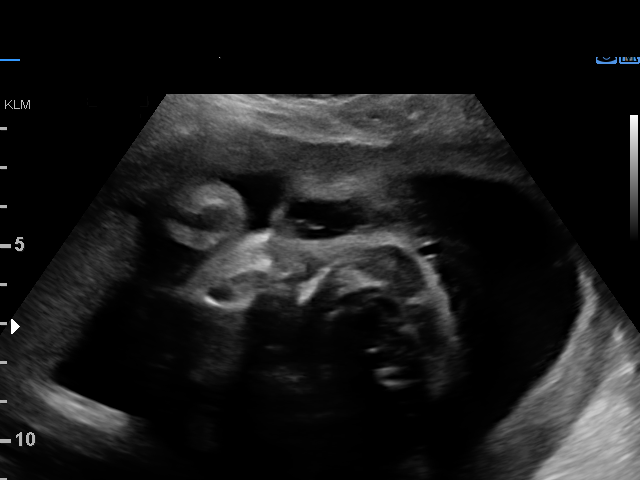
[im 40/72]
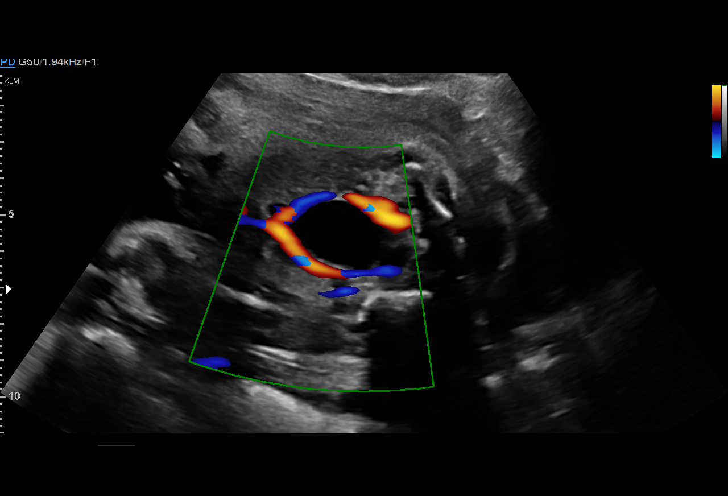
[im 45/72]
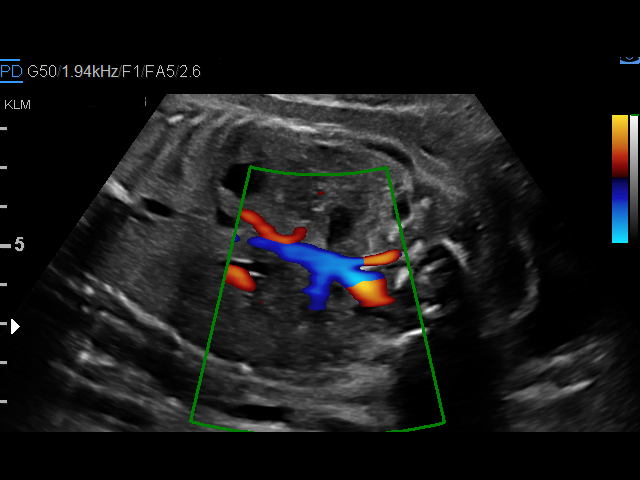
[im 50/72]
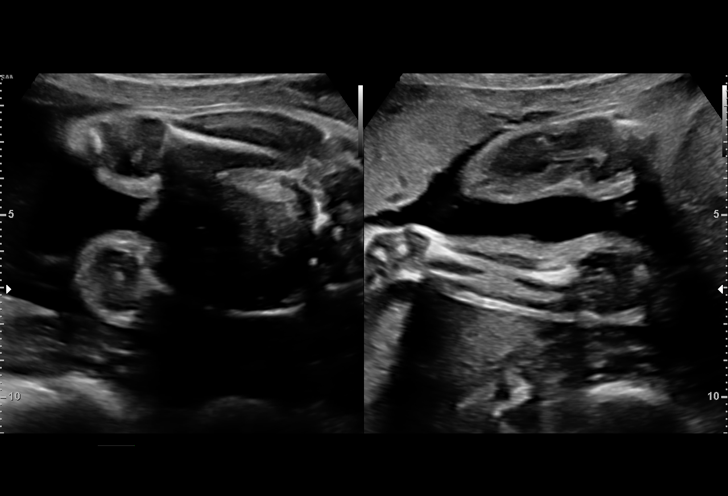
[im 56/72]
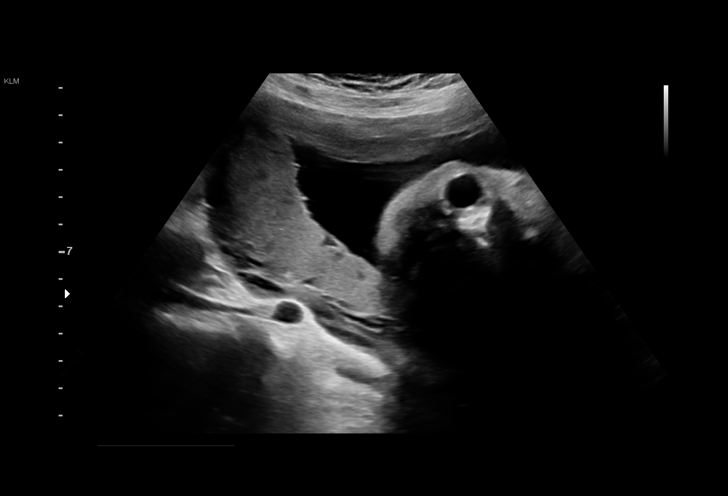
[im 61/72]
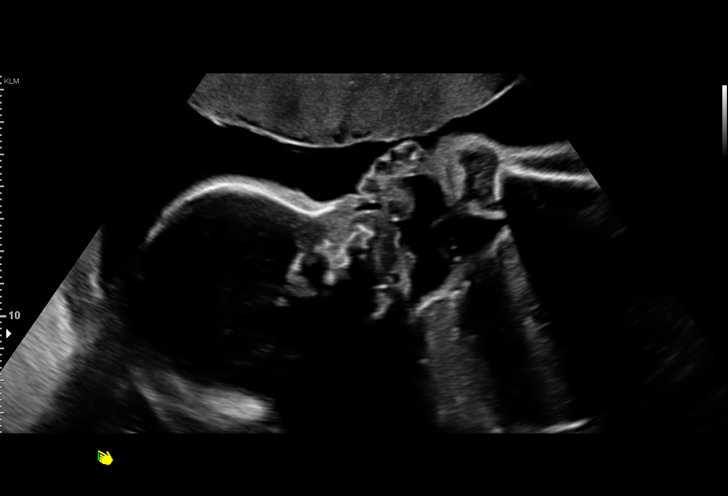
[im 66/72]
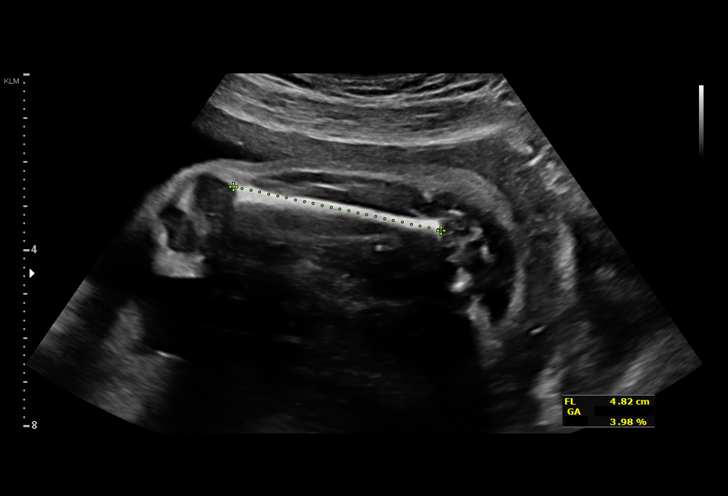
[im 72/72]
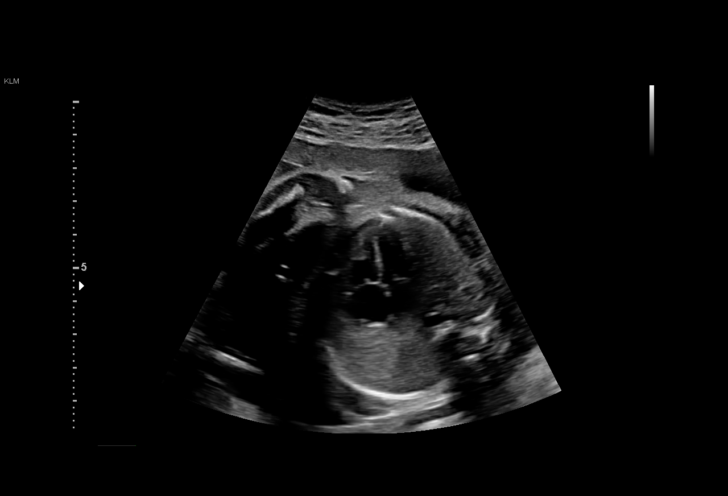

[14 of 28 positions shown; findings below may reference images not displayed]

Indications

Encounter for antenatal screening for
malformations
27 weeks gestation of pregnancy
Substance abuse affecting pregnancy,
antepartum (suboxone, opioiods)
Late prenatal care, second trimester
Fetal Evaluation

Num Of Fetuses:         1
Fetal Heart Rate(bpm):  140
Cardiac Activity:       Observed
Presentation:           Breech
Placenta:               Fundal
P. Cord Insertion:      Visualized, central

Amniotic Fluid
AFI FV:      Within normal limits

Largest Pocket(cm)
5.3
Biometry

BPD:      71.5  mm     G. Age:  28w 5d         79  %    CI:        80.21   %    70 - 86
FL/HC:      19.1   %    18.6 -
HC:      252.2  mm     G. Age:  27w 3d         21  %    HC/AC:      1.10        1.05 -
AC:      230.2  mm     G. Age:  27w 3d         41  %    FL/BPD:     67.3   %    71 - 87
FL:       48.1  mm     G. Age:  26w 1d          8  %    FL/AC:      20.9   %    20 - 24
HUM:      43.5  mm     G. Age:  25w 6d         12  %

Est. FW:    2829  gm      2 lb 4 oz     43  %
OB History

Gravidity:    3         Term:   1        Prem:   1        SAB:   0
TOP:          0       Ectopic:  0        Living: 2
Gestational Age

LMP:           30w 2d        Date:  03/29/17                 EDD:   01/03/18
U/S Today:     27w 3d                                        EDD:   01/23/18
Best:          27w 3d     Det. By:  U/S (10/27/17)           EDD:   01/23/18
Anatomy

Cranium:               Appears normal         LVOT:                   Appears normal
Cavum:                 Appears normal         Aortic Arch:            Appears normal
Ventricles:            Appears normal         Ductal Arch:            Appears normal
Choroid Plexus:        Appears normal         Diaphragm:              Appears normal
Cerebellum:            Appears normal         Stomach:                Appears normal, left
sided
Posterior Fossa:       Appears normal         Abdomen:                Appears normal
Nuchal Fold:           Not applicable (>20    Abdominal Wall:         Not well visualized
wks GA)
Face:                  Appears normal         Cord Vessels:           Appears normal (3
(orbits and profile)                           vessel cord)
Lips:                  Appears normal         Bladder:                Appears normal
Thoracic:              Appears normal         Spine:                  Limited views
appear normal
Heart:                 Appears normal         Upper Extremities:      Not well visualized
(4CH, axis, and situs
RVOT:                  Not well visualized    Lower Extremities:      Appears normal

Other:  Fetus appears to be female. Heels visualized.
Cervix Uterus Adnexa

Cervix
Not visualized (advanced GA >34wks)
Impression

Patient is on suboxone maintenance.

We performed a fetal anatomy scan. No obvious fetal
structural defects are seen. Fetal biometry is consistent with
27w 3d gestation. Amniotic fluid is normal and good fetal
activity is seen. Patient understands the limitations of
ultrasound in detecting fetal anomalies.

We have assigned the EDD at 01/23/2018.
Recommendations

An appointment was made for her to return in 4 weeks for
fetal growth assessment and completion of anatomy (spine
and RVOT).

## 2020-08-16 ENCOUNTER — Other Ambulatory Visit: Payer: Self-pay

## 2020-08-16 ENCOUNTER — Ambulatory Visit (INDEPENDENT_AMBULATORY_CARE_PROVIDER_SITE_OTHER): Payer: Self-pay | Admitting: Student

## 2020-08-16 ENCOUNTER — Encounter: Payer: Self-pay | Admitting: Student

## 2020-08-16 ENCOUNTER — Other Ambulatory Visit: Payer: Self-pay | Admitting: Internal Medicine

## 2020-08-16 VITALS — BP 124/75 | HR 81 | Temp 97.8°F | Ht 67.0 in | Wt 140.9 lb

## 2020-08-16 DIAGNOSIS — F1190 Opioid use, unspecified, uncomplicated: Secondary | ICD-10-CM

## 2020-08-16 DIAGNOSIS — F32A Depression, unspecified: Secondary | ICD-10-CM

## 2020-08-16 DIAGNOSIS — F419 Anxiety disorder, unspecified: Secondary | ICD-10-CM

## 2020-08-16 MED ORDER — LURASIDONE HCL 60 MG PO TABS: 120.0000 mg | ORAL_TABLET | Freq: Every day | ORAL | 1 refills | Status: DC

## 2020-08-16 MED ORDER — BUPRENORPHINE HCL-NALOXONE HCL 4-1 MG SL FILM: 0.5000 | ORAL_FILM | Freq: Two times a day (BID) | SUBLINGUAL | 0 refills | Status: DC

## 2020-08-16 NOTE — Patient Instructions (Addendum)
Marcia West,  It was a pleasure seeing you today.  Here is a summary of what we talked about:  1.  Opioid use: I am glad that Suboxone is helping with the craving.  Please try to cut back on marijuana use.  2.  Mood disorder: Please continue follow-up with your psychiatrist.  Please return in 4 weeks  Take care,  Dr. Cyndie Chime

## 2020-08-16 NOTE — Assessment & Plan Note (Signed)
Reports stable mood.  Denies any issue.  Patient currently following Darcus Austin at [facility] for behavioral health.  -Continue BuSpar, hydroxyzine, Kasandra Knudsen

## 2020-08-16 NOTE — Assessment & Plan Note (Addendum)
Patient presents to the clinic for 4 weeks follow-up on her Suboxone.  Current dose of Suboxone 2- 0.5 mg twice daily.  Patient reported reduce cravings with current dose of medication.  Denies other opioids products use.  Reports smoking marijuana 4-5 times a month.  Denies other substance use.  Induction 03/30/2018 Follow-up 4-8 wks  Last UDS was positive for benzo, barbiturates and cocaine.  -Refill Suboxone  2- 0.5 mg twice daily. -UDS today

## 2020-08-16 NOTE — Progress Notes (Signed)
   CC: Follow-up on Suboxone  HPI:  [name] is a 35 y.o. with past medical history of OUD, depression who presented to the clinic for follow-up with her Suboxone.  Please see problem based charting for detail  Past Medical History:  Diagnosis Date   Anxiety    Bipolar affective (HCC)    Depression    Herpes simplex type II infection    Substance abuse (HCC)    Review of Systems:   Per HPI  Physical Exam:  Vitals:   08/16/20 1418  BP: 124/75  Pulse: 81  Temp: 97.8 F (36.6 C)  TempSrc: Oral  Weight: 140 lb 14.4 oz (63.9 kg)  Height: 5\' 7"  (1.702 m)   Physical Exam Constitutional:      General: She is not in acute distress.    Appearance: She is not toxic-appearing.  HENT:     Head: Normocephalic.  Eyes:     Conjunctiva/sclera: Conjunctivae normal.     Pupils: Pupils are equal, round, and reactive to light.  Cardiovascular:     Rate and Rhythm: Normal rate and regular rhythm.  Pulmonary:     Effort: Pulmonary effort is normal. No respiratory distress.     Breath sounds: Normal breath sounds. No wheezing.  Musculoskeletal:        General: Normal range of motion.     Cervical back: Normal range of motion.  Neurological:     Mental Status: She is alert and oriented to person, place, and time.  Psychiatric:        Mood and Affect: Mood normal.        Behavior: Behavior normal.     Assessment & Plan:   See Encounters Tab for problem based charting.  Patient discussed with Dr. 

## 2020-08-21 DIAGNOSIS — F3111 Bipolar disorder, current episode manic without psychotic features, mild: Secondary | ICD-10-CM | POA: Diagnosis not present

## 2020-08-24 LAB — TOXASSURE SELECT,+ANTIDEPR,UR

## 2020-09-01 NOTE — Progress Notes (Signed)
Internal Medicine Clinic Attending  Case discussed with Dr. Nguyen  At the time of the visit.  We reviewed the resident's history and exam and pertinent patient test results.  I agree with the assessment, diagnosis, and plan of care documented in the resident's note. 

## 2020-09-13 ENCOUNTER — Encounter: Payer: Self-pay | Admitting: Student

## 2020-09-13 ENCOUNTER — Other Ambulatory Visit: Payer: Self-pay

## 2020-09-13 ENCOUNTER — Ambulatory Visit (INDEPENDENT_AMBULATORY_CARE_PROVIDER_SITE_OTHER): Payer: Medicaid Other | Admitting: Student

## 2020-09-13 DIAGNOSIS — F1190 Opioid use, unspecified, uncomplicated: Secondary | ICD-10-CM | POA: Diagnosis present

## 2020-09-13 MED ORDER — BUPRENORPHINE HCL-NALOXONE HCL 4-1 MG SL FILM: 0.5000 | ORAL_FILM | Freq: Two times a day (BID) | SUBLINGUAL | 0 refills | Status: DC

## 2020-09-13 MED ORDER — BUPRENORPHINE HCL-NALOXONE HCL 4-1 MG SL FILM
0.5000 | ORAL_FILM | Freq: Two times a day (BID) | SUBLINGUAL | 0 refills | Status: DC
Start: 2020-09-13 — End: 2020-11-27

## 2020-09-13 NOTE — Patient Instructions (Addendum)
Marcia West,   It was a pleasure seeing you in clinic today.  I am glad you are doing well on the suboxone and that it is helping with craving. Please continue with your current dose and follow up in 8 weeks.  Quincy Simmonds, MD

## 2020-09-14 NOTE — Progress Notes (Signed)
   CC: OUD suboxone follow up  HPI:  [name] is a 35 y.o. with past medical hsitory of OUD, depression, bipolar type I presents for follow up of suboxone.Please refer to problem based charting for further details.  Past Medical History:  Diagnosis Date   Anxiety    Bipolar affective (HCC)    Depression    Herpes simplex type II infection    Substance abuse (HCC)    Review of Systems:  Negative as per HPI  Physical Exam:  Vitals:   09/13/20 1455 09/13/20 1525  BP: 103/76 114/80  Pulse: 91 84  Resp: (!) 24   Temp: 98.5 F (36.9 C)   TempSrc: Oral   SpO2: 98%   Weight: 135 lb 9.6 oz (61.5 kg)   Height: 5\' 4"  (1.626 m)    Constitutional: Appears well-developed and well-nourished. No distress.  HENT: Normocephalic and atraumatic, EOMI, conjunctiva normal, moist mucous membranes Cardiovascular: Normal rate, regular rhythm, S1 and S2 present, no murmurs Respiratory: Effort is normal.  Lungs are clear to auscultation bilaterally. GI: Nondistended, soft, nontender to palpation, normal active bowel sounds Musculoskeletal: Normal bulk and tone.  No peripheral edema noted. Neurological: Is alert and oriented x4, no apparent focal deficits noted. Skin: Warm and dry. No rash, erythema, lesions noted. Psychiatric: Normal mood and affect. Behavior is normal. Judgment and thought content normal.    Assessment & Plan:   See Encounters Tab for problem based charting.  Patient discussed with Dr. 

## 2020-09-14 NOTE — Assessment & Plan Note (Addendum)
Patient presenting for 4 week follow up. Is currently on Suboxone 2-0.5 twice daily. Has been doing well on this low dose. She denies cravings, withdrawal symptoms, or relapses. Denies any other substance use. Tolerating suboxone well, no constipation or nausea or other adverse effects. UDS from prior visit reviewed and appropriate. Does note she was started on Wellbutrin at [facility] a few months ago. PDMP reviewed.   Refill suboxone 4-1mg  half film twice daily Follow up in 8 weeks

## 2020-09-18 NOTE — Progress Notes (Signed)
Internal Medicine Clinic Attending ? ?Case discussed with Dr. Liang  At the time of the visit.  We reviewed the resident?s history and exam and pertinent patient test results.  I agree with the assessment, diagnosis, and plan of care documented in the resident?s note. ? ?

## 2020-10-16 ENCOUNTER — Other Ambulatory Visit: Payer: Self-pay | Admitting: Student

## 2020-10-16 NOTE — Telephone Encounter (Signed)
Refill Request   Buprenorphine HCl-Naloxone HCl (SUBOXONE) 4-1 MG FILM  [id number] - [place], [place] - [address] (Ph: [phone])  Order Details

## 2020-10-17 MED ORDER — BUPRENORPHINE HCL-NALOXONE HCL 4-1 MG SL FILM
0.5000 | ORAL_FILM | Freq: Two times a day (BID) | SUBLINGUAL | 0 refills | Status: DC
Start: 2020-10-17 — End: 2020-11-27

## 2020-11-27 ENCOUNTER — Ambulatory Visit (INDEPENDENT_AMBULATORY_CARE_PROVIDER_SITE_OTHER): Payer: Medicaid Other | Admitting: Student

## 2020-11-27 ENCOUNTER — Other Ambulatory Visit: Payer: Self-pay

## 2020-11-27 VITALS — BP 109/67 | HR 73 | Temp 98.1°F | Wt 135.1 lb

## 2020-11-27 DIAGNOSIS — F32A Depression, unspecified: Secondary | ICD-10-CM | POA: Diagnosis not present

## 2020-11-27 DIAGNOSIS — F1190 Opioid use, unspecified, uncomplicated: Secondary | ICD-10-CM | POA: Diagnosis not present

## 2020-11-27 DIAGNOSIS — F419 Anxiety disorder, unspecified: Secondary | ICD-10-CM

## 2020-11-27 MED ORDER — BUPRENORPHINE HCL-NALOXONE HCL 4-1 MG SL FILM
0.5000 | ORAL_FILM | Freq: Two times a day (BID) | SUBLINGUAL | 1 refills | Status: DC
Start: 2020-11-27 — End: 2021-02-18

## 2020-11-27 NOTE — Assessment & Plan Note (Signed)
Patient follows Darcus Austin at [facility] for behavioral health. Today during PHQ-9 screening patient mentioned feelings of wanting to die a few times weekly. Patient mentions this has been going on for a few months and does not feel like her medications are helping her. Denies any inciting event. She denies current SI/HI or plan. Patient to follow-up with [facility] for further medication changes. We will refer to our in-house behavior health specialist as well.   - Integrated behavioral health referral - Follow-up with [facility]

## 2020-11-27 NOTE — Patient Instructions (Signed)
[name], it was a pleasure seeing you today!  Today we discussed: - Suboxone: We have sent in your prescription for suboxone. We would love to have you as our clinic patient moving forward. We will see you back in clinic in two months.  Follow-up: 2 months   Please make sure to arrive 15 minutes prior to your next appointment. If you arrive late, you may be asked to reschedule.   We look forward to seeing you next time. Please call our clinic at [phone] if you have any questions or concerns. The best time to call is Monday-Friday from 9am-4pm, but there is someone available 24/7. If after hours or the weekend, call the main hospital number and ask for the Internal Medicine Resident On-Call. If you need medication refills, please notify your pharmacy one week in advance and they will send [place] a request.  Thank you for letting us take part in your care. Wishing you the best!  Thank you, Evlyn Kanner, MD

## 2020-11-27 NOTE — Progress Notes (Signed)
Internal Medicine Clinic Attending ? ?Case discussed with Dr. Braswell  At the time of the visit.  We reviewed the resident?s history and exam and pertinent patient test results.  I agree with the assessment, diagnosis, and plan of care documented in the resident?s note.  ?

## 2020-11-27 NOTE — Assessment & Plan Note (Signed)
Ms. Brigandi is presenting today for OUD follow-up. She currently takes Suboxone 2-0.5mg  twice daily. Reports compliance with medication, denies any issues with this. Denies cravings or relapses. Patient also denies any signs of withdrawal, including palpitations, nausea, vomiting, diarrhea, diaphoresis. Today we will obtain UDS and have her follow-up in two months.  In addition, Ms. Gravley would like to establish care with [facility]. We will set up an upcoming appointment for this.  - Suboxone 4-1mg  half film twice daily - Follow-up in two months

## 2020-11-27 NOTE — Progress Notes (Signed)
   CC: OUD  HPI:  Ms.Kameria Seiple is a 35 y.o. with opioid use disorder, bipolar 1 disorder,   Past Medical History:  Diagnosis Date   Anxiety    Bipolar affective (HCC)    Depression    Herpes simplex type II infection    Substance abuse (HCC)    Review of Systems:  As per HPI  Physical Exam:  Vitals:   11/27/20 0858  BP: 109/67  Pulse: 73  Temp: 98.1 F (36.7 C)  TempSrc: Oral  SpO2: 100%  Weight: 135 lb 1.6 oz (61.3 kg)   General: Resting comfortably in chair, no acute distress CV: Regular rate, rhythm. No murmurs appreciated. Pulm: Normal work of breathing on room air. Clear to auscultation bilaterally. Neuro: Awake, alert, conversing appropriately. No focal deficits. Psych: Normal mood, affect, speech.  Assessment & Plan:   See Encounters Tab for problem based charting.  Patient discussed with Dr. Oswaldo Done

## 2020-12-07 LAB — TOXASSURE SELECT,+ANTIDEPR,UR

## 2020-12-18 ENCOUNTER — Ambulatory Visit: Payer: Medicaid Other | Admitting: Behavioral Health

## 2020-12-18 DIAGNOSIS — Z8659 Personal history of other mental and behavioral disorders: Secondary | ICD-10-CM

## 2020-12-18 DIAGNOSIS — F319 Bipolar disorder, unspecified: Secondary | ICD-10-CM

## 2020-12-18 NOTE — BH Specialist Note (Signed)
Integrated Behavioral Health via Telemedicine Visit  12/18/2020 Marcia West [id number]  Number of Integrated Behavioral Health visits: 1/6 Session Start time: 9:00am  Session End time: 9:45am Total time: 45   Referring Provider: Dr. Arlana Pouch, MD Patient/Family location: Pt is home in private; living w/Parents in [place] & has solid support from them. Pt has 34yo Dtr Marcia West living w/her. Pt has 2 Sons; 11yo Marcia West in [place] living w/his Dad & 9yo Marcia West., living in [place] w/his Dad Marcia West. This younger Son is having troubles in [place]. His Dad has expressed he does not want to be a Father in the recent past in front of this Son. [facility] Provider location: [facility] Office All persons participating in visit: Pt & Clinician Types of Service: Health Promotion and Introduction only  I connected with Emergency planning/management officer and/or Investment banker, corporate  self  via  Telephone or Engineer, civil (consulting)  (Video is Caregility application) and verified that I am speaking with the correct person using two identifiers. Discussed confidentiality: Yes   I discussed the limitations of telemedicine and the availability of in person appointments.  Discussed there is a possibility of technology failure and discussed alternative modes of communication if that failure occurs.  I discussed that engaging in this telemedicine visit, they consent to the provision of behavioral healthcare and the services will be billed under their insurance.  Patient and/or legal guardian expressed understanding and consented to Telemedicine visit: Yes   Presenting Concerns: Patient and/or family reports the following symptoms/concerns: elevated anx/dep & SI ("having these more than I should"). Pt has these thoughts 2-4 times/week. Pt given all contact info for Crisis Care, including [phone]. Duration of problem: since; Severity of problem: moderate  Patient and/or Family's Strengths/Protective Factors: Social and Emotional  competence, Concrete supports in place (healthy food, safe environments, etc.), Sense of purpose, Physical Health (exercise, healthy diet, medication compliance, etc.), Caregiver has knowledge of parenting & child development, and Parental Resilience  Goals Addressed: Patient will:  Reduce symptoms of: anxiety, depression, mood instability, and stress; Pt expressed her Provider in [place] @ [name] has not heard her that the medications she prescribes are not working. Encouraged Pt to be assertive as needed w/Provider @ next visit on 12/27/20 @ [facility].  Increase knowledge and/or ability of: coping skills, self-management skills, stress reduction, and emot'l regulation skills    Demonstrate ability to: Increase motivation to adhere to plan of care, Decrease self-medicating behaviors, and use of multiple tools provided today to inc Pt control over acute anxious rxns to situations that are triggering.   Progress towards Goals: Estb'd today; Pt will attend all scheduled sessions, the the Flashbacks HO mailed & explore the resources provided to control anxiety Sx.   Interventions: Interventions utilized:  Solution-Focused Strategies and Supportive Counseling Standardized Assessments completed:  screeners prn  Patient and/or Family Response: Pt receptive to call today & open about her exp of SI & wanting to reduce this. Validated Pt feelings & normalized her exp of Parenting from a distance w/her 2 Sons.  Assessment: Patient currently experiencing elevated anx/dep due to the current situation w/her 2 Sons living up [place] w/their respective Fathers.   Pt can express her eval of her own Parenting in a realistic way.  Pt has good support from her Parents. Pt works PT @ [employer] is manifesting ambition in her position.  Patient may benefit from cont'd support for Recovery Efforts & Return to Use Prevention.  Plan: Follow up with behavioral health clinician  on : 2-3 wks on telehealth for 60  min. Behavioral recommendations: Begin to Journal again since this is a helpful habit for you. Monitor the situation w/your younger Son & his Father who is exhibiting insecure Parenting skills & doubt about Fatherhood.  Referral(s): Integrated [employer] (In Clinic)  I discussed the assessment and treatment plan with the patient and/or parent/guardian. They were provided an opportunity to ask questions and all were answered. They agreed with the plan and demonstrated an understanding of the instructions.   They were advised to call back or seek an in-person evaluation if the symptoms worsen or if the condition fails to improve as anticipated.  Marcia Lever, LMFT

## 2020-12-25 ENCOUNTER — Other Ambulatory Visit: Payer: Self-pay

## 2020-12-25 ENCOUNTER — Encounter: Payer: Self-pay | Admitting: Internal Medicine

## 2020-12-25 ENCOUNTER — Ambulatory Visit: Payer: Medicaid Other | Admitting: Internal Medicine

## 2020-12-25 VITALS — BP 105/66 | HR 73 | Temp 98.1°F | Wt 138.4 lb

## 2020-12-25 DIAGNOSIS — F419 Anxiety disorder, unspecified: Secondary | ICD-10-CM | POA: Diagnosis not present

## 2020-12-25 DIAGNOSIS — F319 Bipolar disorder, unspecified: Secondary | ICD-10-CM

## 2020-12-25 DIAGNOSIS — N912 Amenorrhea, unspecified: Secondary | ICD-10-CM

## 2020-12-25 DIAGNOSIS — F32A Depression, unspecified: Secondary | ICD-10-CM | POA: Diagnosis not present

## 2020-12-25 NOTE — Progress Notes (Signed)
° °  CC: Amenorrhea, anxiety and depression  HPI:  [name] is a 35 y.o. female with past medical history as detailed below who presents to establish care with concerns regarding amenorrhea, anxiety depression.  Please see problem based charting for details assessment and plan.  Past Medical History:  Diagnosis Date   Anxiety    Bipolar affective (HCC)    Depression    Herpes simplex type II infection    Substance abuse (HCC)    Review of Systems:  Review of Systems  Constitutional:  Negative for weight loss.  Cardiovascular:  Negative for palpitations.  Gastrointestinal:  Negative for constipation and diarrhea.  Skin:  Negative for rash.       Positive for increased hair loss  Endo/Heme/Allergies:        Positive for cold intolerance, brain fog. Negative for vision loss in the periphery, galactorrhea, hirsutism, deepening of her voice.  Psychiatric/Behavioral:  Positive for depression. The patient is nervous/anxious.     Physical Exam:  Vitals:   12/25/20 1517  BP: 105/66  Pulse: 73  Temp: 98.1 F (36.7 C)  TempSrc: Oral  SpO2: 100%  Weight: 138 lb 6.4 oz (62.8 kg)   Constitutional: Well-appearing female, no acute distress noted. Cardio: Regular rate and rhythm.  No murmurs, rubs, gallops. Pulm: Clear to auscultation bilaterally. Abdomen: Soft, nontender, nondistended. MSK: Negative for extremity edema. Skin: Warm and dry. Neuro: Alert and oriented x3.  No focal deficit noted. Psych: Normal mood and affect.  Assessment & Plan:   See Encounters Tab for problem based charting.  Patient seen with Dr. Antony Contras

## 2020-12-25 NOTE — Assessment & Plan Note (Signed)
Assessment: Patient is currently on Latuda 120 mg daily.  She started this medication earlier this year.  Interestingly enough, she began experiencing amenorrhea after initiating this medication. Plan: Will address amenorrhea concerns as detailed below.  Have discussed this possible side effect of Latuda with the patient and counseled her to discuss this medication with her psychiatrist if it is a side effect that is bothersome for her.

## 2020-12-25 NOTE — Assessment & Plan Note (Addendum)
Assessment: Patient reports amenorrhea over the last 6 months.  She is not currently on contraceptive however denies sexual activity or chance of pregnancy.  She has taken multiple pregnancy tests at home which were negative.  Interestingly enough, shortly prior to experiencing onset of amenorrhea the patient did begin taking Latuda which can cause amenorrhea secondary to hyperprolactinemia.  She denies any vision changes, galactorrhea, issues of diarrhea or constipation, hirsutism, deepening of her voice.  She does endorse cold intolerance, hair loss, brain fog, and palpitations. Plan: I have advised the patient to discuss the side effect of Latuda with her psychiatrist when she has her visit on December 15.  At this time I will check a prolactin level.  Addendum: Interestingly enough, patient's prolactin level is within normal limits.  I will have a lab only visit set up for further labs to investigate potential causes of her amenorrhea, and have her return to clinic for follow-up visit to go over these labs once they are resulted.  Addendum: Further investigation including TSH, FSH, LH, estradiol all returned within normal limits.  At this point I will refer her to OB/GYN for further work-up of amenorrhea.  She has been off of Latuda for about 2.5 weeks at this time, so there is a chance that her cycles will return if amenorrhea is due to the medication.

## 2020-12-25 NOTE — Assessment & Plan Note (Signed)
Assessment: Patient states that she does not feel that her current medical management is controlling her symptoms of anxiety and depression.  At this time, she is on bupropion 150 mg twice daily and BuSpar 15 mg 3 times daily.  She does have a follow-up appointment with [facility], who is following her for psychiatric needs, on 12/15.   Plan: Patient is agreeable to keeping her current psychiatry appointment with [facility] and addressing her concerns at that time.  I have counseled her that if after this appointment she would like a second opinion or different provider to contact clinic and I would be happy to make that referral for her.

## 2020-12-25 NOTE — Patient Instructions (Signed)
Thank you for visiting the Internal Medicine Clinic today. It was a pleasure to meet you! Today we discussed your anxiety and depression, as well as your lack of menstrual cycle over the last 6 months.  I am sorry that you feel that your anxiety and depression are not under good control!  As we discussed, I would like you to follow-up with [facility] at your appointment in a few days.  After that appointment, if you feel that you are not supported and that your medication management is not so that you feel your symptoms are controlled, please let us know so that we can refer you to another clinic.  We also discussed your lack of menstrual cycle over the last 6 months.  Around this time, you also started taking Latuda, which is a medication that can cause a hormone called prolactin to be increased which can suppress your menstrual cycle.  I will check your prolactin level today.  I would follow-up with your psychiatrist regarding this medication and whether or not you would like to continue or change it.  I have ordered the following for you:  Lab orders: Prolactin level   Remember: If you have any questions or concerns, please call our clinic at [phone] between 9am-5pm and after hours call [phone] and ask for the internal medicine resident on call. If you feel you are having a medical emergency please call 911.  Champ Mungo, DO

## 2020-12-26 ENCOUNTER — Telehealth: Payer: Self-pay | Admitting: Internal Medicine

## 2020-12-26 LAB — PROLACTIN: Prolactin: 8.2 ng/mL (ref 4.8–23.3)

## 2020-12-26 NOTE — Telephone Encounter (Signed)
Spoke with the patient and informed her that her prolactin level, which we were expecting to be elevated, came back as normal.  I explained her that I would like to obtain a few more labs to further investigate potential causes of her amenorrhea.  I will get her scheduled for a lab only visit and have a follow-up scheduled for once those are resulted.  Patient is understanding and agreeable.

## 2020-12-26 NOTE — Addendum Note (Signed)
Addended by: Ihor Dow on: 12/26/2020 09:03 AM   Modules accepted: Orders

## 2020-12-31 ENCOUNTER — Other Ambulatory Visit: Payer: Medicaid Other

## 2020-12-31 DIAGNOSIS — N912 Amenorrhea, unspecified: Secondary | ICD-10-CM

## 2020-12-31 NOTE — Progress Notes (Signed)
Internal Medicine Clinic Attending  I saw and evaluated the patient.  I personally confirmed the key portions of the history and exam documented by Dr.  Dean  and I reviewed pertinent patient test results.  The assessment, diagnosis, and plan were formulated together and I agree with the documentation in the resident's note.  

## 2021-01-01 ENCOUNTER — Telehealth: Payer: Self-pay | Admitting: Internal Medicine

## 2021-01-01 LAB — TSH: TSH: 2.99 u[IU]/mL (ref 0.450–4.500)

## 2021-01-01 LAB — BETA HCG QUANT (REF LAB): hCG Quant: <1 m[IU]/mL

## 2021-01-01 LAB — ESTRADIOL: Estradiol: 18.1 pg/mL

## 2021-01-01 LAB — FOLLICLE STIMULATING HORMONE: FSH: 3.5 m[IU]/mL

## 2021-01-01 NOTE — Telephone Encounter (Signed)
Attempted to contact patient regarding recent lab results for secondary amenorrhea and follow-up telehealth visit scheduled for tomorrow afternoon.  My intention is to refer her to OB/GYN for further evaluation of her secondary amenorrhea as all of her results came back as normal, and I wanted to convey this to her.  It is not necessary, unless the patient has other concerns that she would like to be addressed, to proceed with telehealth visit tomorrow afternoon which I wanted to speak with her about.

## 2021-01-02 ENCOUNTER — Telehealth: Payer: Self-pay | Admitting: Internal Medicine

## 2021-01-02 ENCOUNTER — Telehealth: Payer: Medicaid Other | Admitting: Internal Medicine

## 2021-01-02 NOTE — Addendum Note (Signed)
Addended by: Ihor Dow on: 01/02/2021 08:38 AM   Modules accepted: Orders

## 2021-01-02 NOTE — Telephone Encounter (Signed)
Spoke with patient to inform her that her lab results from last visit were normal and that our next step is to refer her to OB/GYN for further work-up.  We did discuss one of her medications, Latuda, which may be contributing to her symptoms and she explained that she had been off of the medication for the last 2.5 weeks.  I counseled her she may have return of her cycles over time if the medication was truly the cause, however neck steps would still be a referral.  She is agreeable to this plan and no longer wishes to have her telehealth visit this afternoon as she has no other concerns that she wishes to discuss at this time.  I will have the telehealth visit this afternoon canceled.

## 2021-01-09 ENCOUNTER — Telehealth: Payer: Self-pay | Admitting: Behavioral Health

## 2021-01-09 ENCOUNTER — Institutional Professional Consult (permissible substitution): Payer: Medicaid Other | Admitting: Behavioral Health

## 2021-01-09 NOTE — Telephone Encounter (Signed)
Contacted Pt for today's 11:00am IBH telehealth session. Pt is @ work & has no Designer, fashion/clothing; she prefers to r/s until after the [employer].  Dr. Monna Fam

## 2021-02-05 ENCOUNTER — Institutional Professional Consult (permissible substitution): Payer: Medicaid Other | Admitting: Behavioral Health

## 2021-02-05 ENCOUNTER — Telehealth: Payer: Self-pay | Admitting: Behavioral Health

## 2021-02-05 NOTE — Telephone Encounter (Signed)
Lft msg for Pt to RC to I[facility] & r/s @ her convenience since Clinician could not reach her today. ° °Dr. Nitika Jackowski °

## 2021-02-16 ENCOUNTER — Encounter: Payer: Self-pay | Admitting: Internal Medicine

## 2021-02-16 DIAGNOSIS — F1190 Opioid use, unspecified, uncomplicated: Secondary | ICD-10-CM

## 2021-02-18 MED ORDER — BUPRENORPHINE HCL-NALOXONE HCL 4-1 MG SL FILM: 0.5000 | ORAL_FILM | Freq: Two times a day (BID) | SUBLINGUAL | 0 refills | Status: DC

## 2021-02-19 ENCOUNTER — Ambulatory Visit (INDEPENDENT_AMBULATORY_CARE_PROVIDER_SITE_OTHER): Payer: Medicaid Other | Admitting: Student

## 2021-02-19 ENCOUNTER — Other Ambulatory Visit: Payer: Self-pay

## 2021-02-19 DIAGNOSIS — F32A Depression, unspecified: Secondary | ICD-10-CM

## 2021-02-19 DIAGNOSIS — F419 Anxiety disorder, unspecified: Secondary | ICD-10-CM | POA: Diagnosis not present

## 2021-02-19 DIAGNOSIS — F1190 Opioid use, unspecified, uncomplicated: Secondary | ICD-10-CM

## 2021-02-19 MED ORDER — BUPRENORPHINE HCL-NALOXONE HCL 4-1 MG SL FILM: 0.5000 | ORAL_FILM | Freq: Two times a day (BID) | SUBLINGUAL | 2 refills | Status: DC

## 2021-02-19 NOTE — Assessment & Plan Note (Signed)
Patient continues to have anxiety and depressive symptoms however her symptoms have improved after trazodone was added to her regimen. PHQ-9 is down from 14-10 today.  -- Continue to follow [facility] -- Continue bupropion, BuSpar and trazodone

## 2021-02-19 NOTE — Progress Notes (Signed)
° °  02/19/2021  Marcia West presents for follow up of opioid use disorder I have reviewed the prior induction visit, follow up visits, and telephone encounters relevant to opiate use disorder (OUD) treatment.   Current daily dose: Suboxone 2-0.5 mg twice daily  Date of Induction: 03/30/2018  Current follow up interval, in weeks: 8 weeks  The patient has been adherent with the buprenorphine for OUD contract.   Last UDS Result: 11/27/2020, with expected results of buprenorphine and norbuprenorphine  HPI: 35 year old here for follow-up on her substance use disorder. She has been stable on Suboxone 2-0.5 mg twice daily since she reestablish care for her OUD. She denies any cravings. She denies using any other drugs. She has been able to afford her medications. She reports some improvements in her mood after starting a new anxiety medication.  States her mother helps her take care of her 3 children.  Exam:   Vitals:   02/19/21 0920  BP: 101/64  Pulse: 75  Temp: 98.3 F (36.8 C)  TempSrc: Oral  SpO2: 100%  Weight: 142 lb 9.6 oz (64.7 kg)   General: Pleasant, well-appearing middle-aged woman. No acute distress. Head: Normocephalic. Atraumatic. CV: RRR. No murmurs, rubs, or gallops. No LE edema Pulmonary: Lungs CTAB. Normal effort. No wheezing or rales. Skin: Warm and dry. No obvious rash or lesions. Neuro: A&Ox3. Moves all extremities.  Psych: Normal mood and affect   Assessment/Plan:  See Problem Based Charting in the Encounters Tab   Marcia Axon, MD  02/19/2021  9:48 AM

## 2021-02-19 NOTE — Assessment & Plan Note (Addendum)
Patient here for OUD follow-up. She has been stable on Suboxone 2-0.5 mg twice daily for the last few months.  She denies any cravings, relapse or signs of withdrawal. She has been able to access her medications each month and denies any other drug use. She continues to have anxiety however this is improved after starting new medication. Since patient has been stable on current regimen and compliant with OUD contract, discussed with patient about plan to transition her to integrating her OUd clinic with her PCP visits. Patient agreeable to plan.  --Continue Suboxone 2-0.5 mg twice daily --2 months follow-up OUD/PCP visit --UDS at next follow-up --Continue treatment of mood disorders

## 2021-02-25 NOTE — Progress Notes (Signed)
Internal Medicine Clinic Attending  Case discussed with Dr. Amponsah  At the time of the visit.  We reviewed the resident's history and exam and pertinent patient test results.  I agree with the assessment, diagnosis, and plan of care documented in the resident's note.  

## 2021-05-06 ENCOUNTER — Other Ambulatory Visit: Payer: Self-pay | Admitting: *Deleted

## 2021-05-06 DIAGNOSIS — F1190 Opioid use, unspecified, uncomplicated: Secondary | ICD-10-CM

## 2021-05-06 NOTE — Telephone Encounter (Signed)
Appointment Request From: Marcia West ?  ?With Provider: Verdene Lennert, MD [facility]] ?  ?Preferred Date Range: Any ?  ?Preferred Times: Any Time ?  ?Reason for visit: Office Visit ?  ?Comments: ?I am out of Suboxone if some could be sent in to the pharmacy please.   I was also hoping to talk to a doctor about going on birth control.  Thank you ? ?I asked Charsetta if pt can come tomorrow to the OUD clinic. ? ? ?MY Chart message ?[name], Charsetta L  Shaquilla Cornforth 2 hours ago (1:32 PM)  ? ?CH ?Good afternoon, ?  ?I forward your message to the nurse.  After reviewing your chart,  she states that you are due for your 2 months checkup.  Are you able to come in tomorrow morning 05/07/21 at 9:15 am?   ?  ? ?

## 2021-05-07 ENCOUNTER — Telehealth: Payer: Self-pay | Admitting: *Deleted

## 2021-05-07 ENCOUNTER — Other Ambulatory Visit: Payer: Self-pay | Admitting: Internal Medicine

## 2021-05-07 DIAGNOSIS — F1190 Opioid use, unspecified, uncomplicated: Secondary | ICD-10-CM

## 2021-05-07 MED ORDER — BUPRENORPHINE HCL-NALOXONE HCL 4-1 MG SL FILM: 0.5000 | ORAL_FILM | Freq: Two times a day (BID) | SUBLINGUAL | 0 refills | Status: DC

## 2021-05-07 NOTE — Telephone Encounter (Signed)
Pt was called to schedule an appt - no answer; left message to call the office. I will ask front office to call pt as well. ?

## 2021-05-07 NOTE — Telephone Encounter (Signed)
-----   Message from Mercie Eon, MD sent at 05/07/2021  3:33 PM EDT ----- ?Regarding: scheduling patient ?Marcia West, ? ?I'm sorry, I closed out of the encounter for this patient before responding to you. I refilled a 30-day supply of her suboxone, she does need to come in for an appointment though.  ? ?Can you please schedule her for an in-person appointment this week since she wasn't able to come in this morning? It can be in regular [facility] clinic on Wednesday afternoon Mikey Bussing is here) or Thursday AM or PM Eber Jones and I are here). ? ?Thanks! ?Raynelle Fanning  ? ?

## 2021-05-07 NOTE — Addendum Note (Signed)
Addended by: Hassan Buckler on: 05/07/2021 02:45 PM ? ? Modules accepted: Orders ? ?

## 2021-05-07 NOTE — Addendum Note (Signed)
Addended by: Fredderick Severance on: 05/07/2021 04:08 PM ? ? Modules accepted: Orders ? ?

## 2021-05-10 NOTE — Telephone Encounter (Signed)
Patient sent message via my chart to contact our office to schedule an appointment. 

## 2021-06-19 ENCOUNTER — Other Ambulatory Visit: Payer: Self-pay

## 2021-06-19 ENCOUNTER — Ambulatory Visit (INDEPENDENT_AMBULATORY_CARE_PROVIDER_SITE_OTHER): Payer: Medicaid Other | Admitting: Internal Medicine

## 2021-06-19 ENCOUNTER — Encounter: Payer: Self-pay | Admitting: Internal Medicine

## 2021-06-19 VITALS — BP 125/71 | HR 74 | Temp 98.0°F | Ht 67.0 in | Wt 154.9 lb

## 2021-06-19 DIAGNOSIS — F1190 Opioid use, unspecified, uncomplicated: Secondary | ICD-10-CM | POA: Diagnosis not present

## 2021-06-19 DIAGNOSIS — N912 Amenorrhea, unspecified: Secondary | ICD-10-CM | POA: Diagnosis not present

## 2021-06-19 MED ORDER — BUPRENORPHINE HCL-NALOXONE HCL 4-1 MG SL FILM: 0.5000 | ORAL_FILM | Freq: Two times a day (BID) | SUBLINGUAL | 0 refills | Status: DC

## 2021-06-19 NOTE — Patient Instructions (Addendum)
Thank you, [name] for allowing [place] to provide your care today. Today we discussed your medications.  I have refilled your suboxone. Will obtain your urine today and see you in 8 weeks.  I have ordered the following labs for you:  Lab Orders         ToxAssure Select,+Antidepr,UR       Referrals ordered today:   Referral Orders  No referral(s) requested today     Follow up:  8 weeks     Should you have any questions or concerns please call the internal medicine clinic at [phone].

## 2021-06-19 NOTE — Progress Notes (Addendum)
  Marcia West presents for follow up of opioid use disorder I have reviewed the prior induction visit, follow up visits, and telephone encounters relevant to opiate use disorder (OUD) treatment.    Current daily dose: Suboxone 2-0.5 mg twice daily   Date of Induction: 03/30/2018   Current follow up interval, in weeks: 8 weeks   The patient has been adherent with the buprenorphine for OUD contract.   CC: medication refills  HPI:  Marcia West is a 36 y.o. with past medical history as noted below who presents to the clinic today for medication refills. Please see problem-based list for further details, assessments, and plans.   Past Medical History:  Diagnosis Date   Anxiety    Bipolar affective ([place])    Depression    Herpes simplex type II infection    Substance abuse ([place])    Review of Systems: Negative aside from that listed in individualized problem based charting.   Physical Exam:  Vitals:   06/19/21 1544  BP: 125/71  Pulse: 74  Temp: 98 F (36.7 C)  TempSrc: Oral  SpO2: 100%  Weight: 154 lb 14.4 oz (70.3 kg)  Height: 5\' 7"  (1.702 m)   General: NAD, nl appearance HE: Normocephalic, atraumatic, EOMI, Conjunctivae normal ENT: No congestion, no rhinorrhea, no exudate or erythema  Cardiovascular: Normal rate, regular rhythm. No murmurs, rubs, or gallops Pulmonary: Effort normal, breath sounds normal. No wheezes, rales, or rhonchi Abdominal: soft, nontender, bowel sounds present Musculoskeletal: no swelling, deformity, injury or tenderness in extremities Skin: Warm, dry, no bruising, erythema, or rash Psychiatric/Behavioral: normal mood, normal behavior     Assessment & Plan:   See Encounters Tab for problem based charting.  Patient discussed with Dr. Jimmye Norman

## 2021-06-19 NOTE — Assessment & Plan Note (Signed)
Patient is here for an OUD follow-up. Endorses compliance to medication. No acute issues today.   Plan: UDS today Continue suboxone 2-0.5 mg twice daily

## 2021-06-19 NOTE — Assessment & Plan Note (Signed)
States that her periods have resumed since she was last seen here.

## 2021-06-23 LAB — TOXASSURE SELECT,+ANTIDEPR,UR

## 2021-06-26 NOTE — Progress Notes (Signed)
Internal Medicine Clinic Attending ° °Case discussed with Dr. Bonanno  °  At the time of the visit.  We reviewed the resident’s history and exam and pertinent patient test results.  I agree with the assessment, diagnosis, and plan of care documented in the resident’s note. ° °

## 2021-08-06 ENCOUNTER — Other Ambulatory Visit: Payer: Self-pay | Admitting: Internal Medicine

## 2021-08-06 MED ORDER — BUPRENORPHINE HCL-NALOXONE HCL 4-1 MG SL FILM: ORAL_FILM | SUBLINGUAL | 0 refills | Status: DC

## 2021-08-06 NOTE — Progress Notes (Signed)
Called pt - no answer; left message to call the office re: Medication.

## 2021-08-06 NOTE — Progress Notes (Signed)
Rx refilled.  Appointment on 8/2.    Please call Marcia West to let her know.   Thank you!

## 2021-08-14 ENCOUNTER — Encounter: Payer: Medicaid Other | Admitting: Internal Medicine

## 2021-09-16 ENCOUNTER — Ambulatory Visit (HOSPITAL_COMMUNITY)
Admission: EM | Admit: 2021-09-16 | Discharge: 2021-09-17 | Disposition: A | Discharge status: Home / Self Care | Payer: Medicaid Other | Attending: Psychiatry | Admitting: Psychiatry

## 2021-09-16 DIAGNOSIS — F419 Anxiety disorder, unspecified: Secondary | ICD-10-CM | POA: Insufficient documentation

## 2021-09-16 DIAGNOSIS — F1410 Cocaine abuse, uncomplicated: Secondary | ICD-10-CM | POA: Insufficient documentation

## 2021-09-16 DIAGNOSIS — F3160 Bipolar disorder, current episode mixed, unspecified: Secondary | ICD-10-CM | POA: Insufficient documentation

## 2021-09-16 DIAGNOSIS — Z20822 Contact with and (suspected) exposure to covid-19: Secondary | ICD-10-CM | POA: Insufficient documentation

## 2021-09-16 LAB — COMPREHENSIVE METABOLIC PANEL
ALT: 19 U/L (ref 0–44)
AST: 21 U/L (ref 15–41)
Albumin: 3.9 g/dL (ref 3.5–5.0)
Alkaline Phosphatase: 47 U/L (ref 38–126)
Anion gap: 9 (ref 5–15)
BUN: 13 mg/dL (ref 6–20)
CO2: 24 mmol/L (ref 22–32)
Calcium: 9 mg/dL (ref 8.9–10.3)
Chloride: 107 mmol/L (ref 98–111)
Creatinine, Ser: 0.83 mg/dL (ref 0.44–1.00)
GFR, Estimated: >60 mL/min (ref >60)
Glucose, Bld: 131 mg/dL — ABNORMAL HIGH (ref 70–99)
Potassium: 4 mmol/L (ref 3.5–5.1)
Sodium: 140 mmol/L (ref 135–145)
Total Bilirubin: 0.4 mg/dL (ref 0.3–1.2)
Total Protein: 6 g/dL — ABNORMAL LOW (ref 6.5–8.1)

## 2021-09-16 LAB — LIPID PANEL
Cholesterol: 109 mg/dL (ref 0–200)
HDL: 46 mg/dL (ref >40)
LDL Cholesterol: 55 mg/dL (ref 0–99)
Total CHOL/HDL Ratio: 2.4 RATIO
Triglycerides: 41 mg/dL (ref <150)
VLDL: 8 mg/dL (ref 0–40)

## 2021-09-16 LAB — CBC WITH DIFFERENTIAL/PLATELET
Abs Immature Granulocytes: 0.02 10*3/uL (ref 0.00–0.07)
Basophils Absolute: 0 10*3/uL (ref 0.0–0.1)
Basophils Relative: 0 %
Eosinophils Absolute: 0.1 10*3/uL (ref 0.0–0.5)
Eosinophils Relative: 2 %
HCT: 37.5 % (ref 36.0–46.0)
Hemoglobin: 12.7 g/dL (ref 12.0–15.0)
Immature Granulocytes: 0 %
Lymphocytes Relative: 29 %
Lymphs Abs: 1.3 10*3/uL (ref 0.7–4.0)
MCH: 31.8 pg (ref 26.0–34.0)
MCHC: 33.9 g/dL (ref 30.0–36.0)
MCV: 93.8 fL (ref 80.0–100.0)
Monocytes Absolute: 0.3 10*3/uL (ref 0.1–1.0)
Monocytes Relative: 8 %
Neutro Abs: 2.8 10*3/uL (ref 1.7–7.7)
Neutrophils Relative %: 61 %
Platelets: 174 10*3/uL (ref 150–400)
RBC: 4 MIL/uL (ref 3.87–5.11)
RDW: 12.8 % (ref 11.5–15.5)
WBC: 4.5 10*3/uL (ref 4.0–10.5)
nRBC: 0 % (ref 0.0–0.2)

## 2021-09-16 LAB — POCT URINE DRUG SCREEN - MANUAL ENTRY (I-SCREEN)
POC Amphetamine UR: NOT DETECTED
POC Buprenorphine (BUP): POSITIVE — AB
POC Cocaine UR: NOT DETECTED
POC Marijuana UR: POSITIVE — AB
POC Methadone UR: NOT DETECTED
POC Methamphetamine UR: NOT DETECTED
POC Morphine: NOT DETECTED
POC Oxazepam (BZO): NOT DETECTED
POC Oxycodone UR: NOT DETECTED
POC Secobarbital (BAR): NOT DETECTED

## 2021-09-16 LAB — URINALYSIS, ROUTINE W REFLEX MICROSCOPIC
Bilirubin Urine: NEGATIVE
Glucose, UA: NEGATIVE mg/dL
Hgb urine dipstick: NEGATIVE
Ketones, ur: NEGATIVE mg/dL
Leukocytes,Ua: NEGATIVE
Nitrite: NEGATIVE
Protein, ur: NEGATIVE mg/dL
Specific Gravity, Urine: 1.017 (ref 1.005–1.030)
pH: 7 (ref 5.0–8.0)

## 2021-09-16 LAB — RESP PANEL BY RT-PCR (FLU A&B, COVID) ARPGX2
Influenza A by PCR: NEGATIVE
Influenza B by PCR: NEGATIVE
SARS Coronavirus 2 by RT PCR: NEGATIVE

## 2021-09-16 LAB — ETHANOL: Alcohol, Ethyl (B): <10 mg/dL (ref <10)

## 2021-09-16 LAB — POC SARS CORONAVIRUS 2 AG -  ED: SARS Coronavirus 2 Ag: NEGATIVE

## 2021-09-16 LAB — HEMOGLOBIN A1C
Hgb A1c MFr Bld: 4.9 % (ref 4.8–5.6)
Mean Plasma Glucose: 93.93 mg/dL

## 2021-09-16 LAB — PREGNANCY, URINE: Preg Test, Ur: NEGATIVE

## 2021-09-16 LAB — MAGNESIUM: Magnesium: 2 mg/dL (ref 1.7–2.4)

## 2021-09-16 LAB — POCT PREGNANCY, URINE: Preg Test, Ur: NEGATIVE

## 2021-09-16 LAB — TSH: TSH: 0.592 u[IU]/mL (ref 0.350–4.500)

## 2021-09-16 MED ORDER — ONDANSETRON 4 MG PO TBDP: 4.0000 mg | ORAL_TABLET | Freq: Four times a day (QID) | ORAL | Status: DC | PRN

## 2021-09-16 MED ORDER — HYDROXYZINE HCL 25 MG PO TABS
25.0000 mg | ORAL_TABLET | Freq: Four times a day (QID) | ORAL | Status: DC | PRN
  Administered 2021-09-16 – 2021-09-17 (×2): 25 mg via ORAL
  Filled 2021-09-16 (×2): qty 1

## 2021-09-16 MED ORDER — GABAPENTIN 100 MG PO CAPS
200.0000 mg | ORAL_CAPSULE | Freq: Two times a day (BID) | ORAL | Status: DC
  Administered 2021-09-16 – 2021-09-17 (×3): 200 mg via ORAL
  Filled 2021-09-16 (×3): qty 2

## 2021-09-16 MED ORDER — OLANZAPINE 5 MG PO TBDP
5.0000 mg | ORAL_TABLET | Freq: Once | ORAL | Status: AC
  Administered 2021-09-16: 5 mg via ORAL
  Filled 2021-09-16: qty 1

## 2021-09-16 MED ORDER — TRAZODONE HCL 50 MG PO TABS
50.0000 mg | ORAL_TABLET | Freq: Every evening | ORAL | Status: DC | PRN
  Administered 2021-09-16: 50 mg via ORAL
  Filled 2021-09-16: qty 1

## 2021-09-16 MED ORDER — NAPROXEN 500 MG PO TABS: 500.0000 mg | ORAL_TABLET | Freq: Two times a day (BID) | ORAL | Status: DC | PRN

## 2021-09-16 MED ORDER — METHOCARBAMOL 500 MG PO TABS: 500.0000 mg | ORAL_TABLET | Freq: Three times a day (TID) | ORAL | Status: DC | PRN

## 2021-09-16 MED ORDER — DICYCLOMINE HCL 20 MG PO TABS: 20.0000 mg | ORAL_TABLET | Freq: Four times a day (QID) | ORAL | Status: DC | PRN

## 2021-09-16 MED ORDER — ACETAMINOPHEN 325 MG PO TABS: 650.0000 mg | ORAL_TABLET | Freq: Four times a day (QID) | ORAL | Status: DC | PRN

## 2021-09-16 MED ORDER — ALUM & MAG HYDROXIDE-SIMETH 200-200-20 MG/5ML PO SUSP: 30.0000 mL | ORAL | Status: DC | PRN

## 2021-09-16 MED ORDER — OLANZAPINE 5 MG PO TBDP
5.0000 mg | ORAL_TABLET | Freq: Every day | ORAL | Status: AC
  Administered 2021-09-16: 5 mg via ORAL
  Filled 2021-09-16: qty 1

## 2021-09-16 MED ORDER — MAGNESIUM HYDROXIDE 400 MG/5ML PO SUSP: 30.0000 mL | Freq: Every day | ORAL | Status: DC | PRN

## 2021-09-16 MED ORDER — LOPERAMIDE HCL 2 MG PO CAPS: 2.0000 mg | ORAL_CAPSULE | ORAL | Status: DC | PRN

## 2021-09-16 MED ORDER — NICOTINE 14 MG/24HR TD PT24
14.0000 mg | MEDICATED_PATCH | Freq: Every day | TRANSDERMAL | Status: DC
  Administered 2021-09-16: 14 mg via TRANSDERMAL
  Filled 2021-09-16 (×3): qty 1

## 2021-09-16 MED ORDER — OLANZAPINE 10 MG PO TBDP: 10.0000 mg | ORAL_TABLET | Freq: Every day | ORAL | Status: DC

## 2021-09-16 NOTE — BH Assessment (Addendum)
Comprehensive Clinical Assessment (CCA) Screening, Triage and Referral Note  09/16/2021 Marcia West [id number]  Disposition: Triage/Screening completed. Patient is Routine. Please room patient.  Chief Complaint: manic symptoms; auditory/visual hallucinations; insomnia  Visit Diagnosis:Prior diagnosis of Alcohol Use Disorder, Opioid Use Disorder, Bipolar Disorder with Manic Episodes, Cocaine Use Disorder, and Cannabis Use Disorder.   Patient Reported Information How did you hear about [place]? Self  What Is the Reason for Your Visit/Call Today?  Marcia West is a 36 y/o female that presents to the [facility], accompanied by her stepfather Marcia West). Prior diagnosis of Alcohol Use Disorder, Opioid Use Disorder, Bipolar Disorder with Manic Episodes, Cocaine Use Disorder, and Cannabis Use Disorder. Also, a significant hx of trauma. Presented to [facility]) for a psychiatric evaluation yesterday. She was then referred to the [facility] Emergency Department and told to request another psychiatric evaluation. She went to [facility] Emergency Department and they told her they didn't do psych evaluations and was told to come here. Current symptoms:anxiety, impulsive behaviors, racing thoughts, and insomnia. No SI and HI. She reports auditory hallucinations of "birds and people calling my name". Denies reports visual hallucinations of shadows and "people walking across the yard". Symptoms related to AVH's x10 days. No recent drug use. Last relapse was January 2023. Alcohol use 7 days ago, glass of wine. Currently in outpatient group therapy for substance use. Doesn't have a psychiatrist at presents. Previously prescribed Adderal.  How Long Has This Been Causing You Problems? 1 wk - 1 month  What Do You Feel Would Help You the Most Today? Medication(s); Treatment for Depression or other mood problem   Have You Recently Had Any Thoughts About Hurting  Yourself? No  Are You Planning to Commit Suicide/Harm Yourself At This time? No data recorded  Have you Recently Had Thoughts About Hurting Someone Marcia West? No  Are You Planning to Harm Someone at This Time? No  Explanation: No data recorded  Have You Used Any Alcohol or Drugs in the Past 24 Hours? Yes  How Long Ago Did You Use Drugs or Alcohol? No data recorded What Did You Use and How Much? Unable to quantify.   Do You Currently Have a Therapist/Psychiatrist? No data recorded Name of Therapist/Psychiatrist: No data recorded  Have You Been Recently Discharged From Any Office Practice or Programs? No data recorded Explanation of Discharge From Practice/Program: No data recorded   CCA Screening Triage Referral Assessment Type of Contact: face to face  Provider Location: [facility]  Collateral Involvement: Step father   Does Patient Have a Automotive engineer Guardian? No Name and Contact of Legal Guardian: No data recorded If Minor and Not Living with Parent(s), Who has Custody? No data recorded Is CPS involved or ever been involved? No data recorded Is APS involved or ever been involved? No data recorded  Patient Determined To Be At Risk for Harm To Self or Others Based on Review of Patient Reported Information or Presenting Complaint? No Method: No data recorded Availability of Means: None Intent: No data recorded Notification Required: No data recorded Additional Information for Danger to Others Potential: No data recorded Additional Comments for Danger to Others Potential: No data recorded Are There Guns or Other Weapons in Your Home? No data recorded Types of Guns/Weapons: No data recorded Are These Weapons Safely Secured?  No data recorded Who Could Verify You Are Able To Have These Secured: No data recorded Do You Have any Outstanding Charges, Pending Court Dates, Parole/Probation? No data  recorded Contacted To Inform of Risk of Harm To Self or Others: No data recorded  Does Patient Present under Involuntary Commitment? No IVC Papers Initial File Date: n/a  [place] of Residence:[place]  Patient Currently Receiving the Following Services: No data recorded  Determination of Need: Urgent (48 hours)   Options For Referral: Inpatient Hospitalization; Outpatient Therapy; Intensive Outpatient Therapy; Medication Management; Chemical Dependency Intensive Outpatient Therapy (CDIOP)   Discharge Disposition: Patient to be admitted to the observation unit for inpatient psychiatric placement.     Marcia West, Counselor

## 2021-09-16 NOTE — ED Notes (Signed)
Pt came to nurse and complained that she is unable to sleep. Pt requested for trazodone for sleep.

## 2021-09-16 NOTE — ED Provider Notes (Signed)
BH Urgent Care Continuous Assessment Admission H&P  Date: 09/16/21 Patient Name: Marcia West MRN: [id number] Chief Complaint:  Chief Complaint  Patient presents with   Psychiatric Evaluation      Diagnoses:  Final diagnoses:  Bipolar I disorder, most recent episode mixed [facility](HCC)    HPI: [facility] accompanied by stepfather Farrel ConnersKenia Coltrane with complaints of intermittent manic episodes.  Marcia Nayachael Berrones, 36 y.o., female patient seen face to face by this provider, consulted with Dr. Lucianne MussKumar; and chart reviewed on 09/16/21.  Per chart review patient has a past psychiatric history of bipolar 1 disorder, anxiety, depression, polysubstance abuse (opioid, alcohol, methadone and cocaine) and trauma.  Reports he is currently prescribed Klonopin, Suboxone, hydroxyzine, and BuSpar by her PCP Dr. Debe CoderEmily Mullen.  Reports she has no outpatient psychiatric services in place.  Past medication trials include: Seroquel, Wellbutrin, Prozac, BuSpar, Abilify, lithium, Latuda, and Vraylar.  She self-reports a history of 6 inpatient psychiatric admissions with her last admission at [facility] 01/20[facility].  Of note patient presented [facility] residential [facility] days ago with similar presentation and was referred to [facility]. Then at [facility] she was told that they do not do psychiatric assessments and was provided with resources for [facility].  On evaluation Marcia NayRachael Passmore fairly groomed and makes fair eye contact.  She has normal speech.  She is alert/oriented x4 and cooperative.  Reports she has not used any substances except for marijuana since 01/2021.  She has been on Suboxone 4 mg strips daily, which she states she separates and takes 2 mg in the morning and 2 mg in the evening.  Reports she was doing well taking care of her children and working full-time at [employer].  Reports she has a good support system and points to her stepfather who is present during the assessment. However,  over the past 10 days she has declined and has had an increase in her depression, anxiety and intermittent manic episodes.  She endorses persistent racing thoughts.  She has only slept 3 hours in the past 2 days.  Her eyes appear swollen and red.  She has been impulsive.  Yesterday she woke up and was going to drive to [place] to see one of her other children as her family was trying to have her rationalize that she cannot go visit at this time due to her not sleeping.  Her stepfather who is present states he is concerned for her wellbeing.  States she is not rational and very impulsive.  Yesterday she went to 3 different churches and sat through the services with her young children but did not remember getting there.  Patient reports that God was guiding her to go to the services.  She reports increased depression with feelings of helplessness, hopelessness, decreased motivation, decreased focus and decreased appetite.  She has a depressed affect and is tearful at times.  She endorses auditory hallucinations of hearing birds and people call her name.  Voices are not command in nature.  She endorses visual hallucinations of seeing people or shadows in her yard.  Objectively she does not appear to be responding to internal/external stimuli.  She has only hallucinated in the past when she has used substances, but she has been clean since January. She denies SI/HI.  She can contract for safety concerning SI, but does not feel comfortable being discharged home because she feels unstable and unsafe.  Patient is requesting to be detoxed off of Suboxone  and to be restarted on medications that can help with her depression, anxiety and manic episodes.  She believes it would be beneficial if she were to be admitted to the psychiatric hospital.  She does meet the criteria for inpatient psychiatric admission.  [facility] notified there is no bed availability.  Discussed admission to the continuous assessment unit while  awaiting inpatient psychiatric bed availability.  Explained the milieu.  Patient is in agreement.  PHQ 2-9:  Flowsheet Row Office Visit from 02/19/2021 in [facility] Office Visit from 12/25/2020 in [facility] Office Visit from 11/27/2020 in [facility]  Thoughts that you would be better off dead, or of hurting yourself in some way Not at all Several days Several days  [Provider made aware]  PHQ-9 Total Score 10 14 13        Flowsheet Row Admission (Discharged) from 01/19/2020 in [facility] IN[facility]IENT BE[facility]IORAL MEDICINE Most recent reading at 01/20/2020 12:00 AM ED from 01/19/2020 in [facility] Most recent reading at 01/19/2020 11:56 AM Admission (Discharged) from 11/23/2019 in [facility] INPATIENT BEHAVIORAL MEDICINE Most recent reading at 11/24/2019  3:20 AM  C-SSRS RISK CATEGORY High Risk High Risk Moderate Risk        Total Time spent with patient: 30 minutes  Musculoskeletal  Strength & Muscle Tone: within normal limits Gait & Station: normal Patient leans: N/A  Psychiatric Specialty Exam  Presentation General Appearance: Casual  Eye Contact:Fair  Speech:Clear and Coherent; Normal Rate  Speech Volume:Normal  Handedness:Right   Mood and Affect  Mood:Depressed; Anxious; Worthless; Hopeless  Affect:Tearful; Congruent; Depressed   Thought Process  Thought Processes:Coherent  Descriptions of Associations:Intact  Orientation:Full (Time, Place and Person)  Thought Content:Logical  Diagnosis of Schizophrenia or Schizoaffective disorder in past: No  Duration of Psychotic Symptoms: Greater than six months  Hallucinations:Hallucinations: Auditory; Visual Description of Auditory Hallucinations: hears birds and people who are calling her name. Description of Visual Hallucinations: sees people and shadows in her yard  Ideas of Reference:None  Suicidal Thoughts:Suicidal  Thoughts: No  Homicidal Thoughts:Homicidal Thoughts: No   Sensorium  Memory:Immediate Fair; Remote Fair  Judgment:Poor  Insight:Fair   Executive Functions  Concentration:Good  Attention Span:Good  Recall:Good  Fund of Knowledge:Good  Language:Good   Psychomotor Activity  Psychomotor Activity:Psychomotor Activity: Normal   Assets  Assets:Physical Health; Resilience; Social Support; 13/11/2019; Desire for Improvement; Communication Skills   Sleep  Sleep:Sleep: Poor Number of Hours of Sleep: 0   Nutritional Assessment (For OBS and FBC admissions only) Has the patient had a weight loss or gain of 10 pounds or more in the last 3 months?: No Has the patient had a decrease in food intake/or appetite?: Yes Does the patient have dental problems?: No Does the patient have eating habits or behaviors that may be indicators of an eating disorder including binging or inducing vomiting?: No Has the patient recently lost weight without trying?: 2.0 Has the patient been eating poorly because of a decreased appetite?: 1 Malnutrition Screening Tool Score: 3    Physical Exam Vitals and nursing note reviewed.  Constitutional:      General: She is not in acute distress.    Appearance: Normal appearance. She is not ill-appearing.  HENT:     Head: Normocephalic.  Eyes:     General:        Right eye: No discharge.        Left eye: No discharge.  Conjunctiva/sclera: Conjunctivae normal.  Cardiovascular:     Rate and Rhythm: Normal rate.  Pulmonary:     Effort: Pulmonary effort is normal. No respiratory distress.  Musculoskeletal:        General: Normal range of motion.     Cervical back: Normal range of motion.  Skin:    Coloration: Skin is not jaundiced or pale.  Neurological:     Mental Status: She is alert and oriented to person, place, and time.  Psychiatric:        Attention and Perception: She perceives auditory and visual hallucinations.         Mood and Affect: Mood is anxious and depressed. Affect is tearful.        Speech: Speech normal.        Behavior: Behavior is cooperative.        Thought Content: Thought content normal.        Cognition and Memory: Cognition normal.        Judgment: Judgment is impulsive.    Review of Systems  Constitutional: Negative.   HENT: Negative.    Eyes: Negative.   Respiratory: Negative.    Cardiovascular: Negative.   Musculoskeletal: Negative.   Skin: Negative.   Neurological: Negative.   Psychiatric/Behavioral:  Positive for depression and hallucinations. The patient is nervous/anxious and has insomnia.     Blood pressure 102/70, pulse 85, temperature 98.8 F (37.1 C), temperature source Oral, resp. rate 18, height 5\' 7"  (1.702 m), weight 130 lb (59 kg), SpO2 99 %. Body mass index is 20.36 kg/m.  Past Psychiatric History:  bipolar 1 disorder, anxiety, depression, polysubstance abuse (opioid, alcohol, methadone and cocaine).    Is the patient at risk to self? No  Has the patient been a risk to self in the past 6 months? No .    Has the patient been a risk to self within the distant past? Yes   Is the patient a risk to others? No   Has the patient been a risk to others in the past 6 months? No   Has the patient been a risk to others within the distant past? No   Past Medical History:  Past Medical History:  Diagnosis Date   Anxiety    Bipolar affective (HCC)    Depression    Herpes simplex type II infection    Substance abuse (H[facility])     Past Surgical History:  Procedure Laterality Date   NO PAST SURGERIES     WISDOM TOOTH EXTRACTION      Family History:  Family History  Problem Relation Age of Onset   Arthritis Maternal Grandmother    Diabetes Maternal Grandmother    Diabetes Maternal Grandfather    Obesity Paternal Grandmother     Social History:  Social History   Socioeconomic History   Marital status: Single    Spouse name: Not on file   Number of  children: Not on file   Years of education: Not on file   Highest education level: Not on file  Occupational History   Not on file  Tobacco Use   Smoking status: Every Day    Packs/day: 0.25    Types: Cigarettes   Smokeless tobacco: Never   Tobacco comments:    4 cigs per day  Vaping Use   Vaping Use: Never used  Substance and Sexual Activity   Alcohol use: Not Currently   Drug use: Not Currently    Types: Methamphetamines, Other-see comments, Marijuana, "Crack"  cocaine    Comment: suboxone daily   Sexual activity: Not Currently    Birth control/protection: None  Other Topics Concern   Not on file  Social History Narrative   Not on file   Social Determinants of Health   Financial Resource Strain: Not on file  Food Insecurity: Not on file  Transportation Needs: Not on file  Physical Activity: Not on file  Stress: Not on file  Social Connections: Not on file  Intimate Partner Violence: Not on file    SDOH:  SDOH Screenings   Alcohol Screen: Medium Risk (01/19/2020)  Depression (PHQ2-9): Low Risk  (06/19/2021)  Tobacco Use: High Risk (06/19/2021)    Last Labs:  Office Visit on 06/19/2021  Component Date Value Ref Range Status   Summary 06/19/2021 Note   Final   Comment: ==================================================================== ToxAssure Select,+Antidepr,UR ==================================================================== Test                             Result       Flag       Units  Drug Present and Declared for Prescription Verification   Buprenorphine                  41           EXPECTED   ng/mg creat   Norbuprenorphine               48           EXPECTED   ng/mg creat    Source of buprenorphine is a scheduled prescription medication.    Norbuprenorphine is an expected metabolite of buprenorphine.    Bupropion                      PRESENT      EXPECTED   Hydroxybupropion               PRESENT      EXPECTED    Hydroxybupropion is an expected  metabolite of bupropion.  Drug Present not Declared for Prescription Verification   Carboxy-THC                    536          UNEXPECTED ng/mg creat    Carboxy-THC is a metabolite of tetrahydrocannabinol (THC). Source of    THC is most commonly herbal marijuana or marijuana-ba                          sed products,    but THC is also present in a scheduled prescription medication.    Trace amounts of THC can be present in hemp and cannabidiol (CBD)    products. This test is not intended to distinguish between delta-9-    tetrahydrocannabinol, the predominant form of THC in most herbal or    marijuana-based products, and delta-8-tetrahydrocannabinol.  Drug Absent but Declared for Prescription Verification   Trazodone                      Not Detected UNEXPECTED ==================================================================== Test                      Result    Flag   Units      Ref Range   Creatinine              116  mg/dL      >=17 ==================================================================== Declared Medications:  The flagging and interpretation on this report are based on the  following declared medications.  Unexpected results may arise from  inaccuracies in the declared medications.   **Note: The testing scope of this panel includes thes                          e medications:   Buprenorphine. (Suboxone)  Bupropion  Trazodone (Desyrel)   **Note: The testing scope of this panel does not include the  following reported medications:   Buspirone (Buspar)  Hydroxyzine  Naloxone (Suboxone) ==================================================================== For clinical consultation, please call [phone]. ====================================================================     Allergies: Patient has no known allergies.  PTA Medications: (Not in a hospital admission)   Medical Decision Making  Patient presents to the [facility] with  complaints of auditory/visual hallucinations, intermittent manic episodes that includes impulsivity and irrational thinking and only 3 hours of sleep in the past 48 hours.  She is currently not taking any medications for mood control, depression or anxiety.  She meets criteria for inpatient psychiatric admission.  Recommendations  Based on my evaluation the patient does not appear to have an emergency medical condition.  Patient meets criteria for inpatient psychiatric admission.  She will be admitted to the continuous assessment unit. BH H notified and there is no bed availability.  Social work notified patient has been faxed out.  Meds ordered this encounter  COWS PROTOCOL PRN MEDICATIONS    acetaminophen (TYLENOL) tablet 650 mg   alum & mag hydroxide-simeth (MAALOX/MYLANTA) 200-200-20 MG/5ML suspension 30 mL   magnesium hydroxide (MILK OF MAGNESIA) suspension 30 mL   traZODone (DESYREL) tablet 50 mg   dicyclomine (BENTYL) tablet 20 mg   hydrOXYzine (ATARAX) tablet 25 mg   loperamide (IMODIUM) capsule 2-4 mg   methocarbamol (ROBAXIN) tablet 500 mg   naproxen (NAPROSYN) tablet 500 mg   ondansetron (ZOFRAN-ODT) disintegrating tablet 4 mg   nicotine (NICODERM CQ - dosed in mg/24 hours) patch 14 mg  Medications for mood control/depression/mania/anxiety    OLANZapine zydis (ZYPREXA) disintegrating tablet 5 mg NOW   OLANZapine zydis (ZYPREXA) disintegrating tablet 5 mg QHS 9/4   OLANZapine zydis (ZYPREXA) disintegrating tablet 10 mg to start on 9/5 QHS   gabapentin (NEURONTIN) capsule 200 mg BID    Lab Orders         Resp Panel by RT-PCR (Flu A&B, Covid) Anterior Nasal Swab         CBC with Differential/Platelet         Comprehensive metabolic panel         Hemoglobin A1c         Magnesium         Ethanol         Lipid panel         TSH         RPR         Urinalysis, Routine w reflex microscopic Urine, Clean Catch         Pregnancy, urine         POCT Urine Drug Screen -  (I-Screen)         POC SARS Coronavirus 2 Ag-ED - Nasal Swab      EKG     Ardis Hughs, NP 09/16/21  11:59 AM

## 2021-09-16 NOTE — ED Notes (Signed)
Patient sitting on unit watching television, no distress noted, will continue to monitor patient for safety

## 2021-09-16 NOTE — ED Notes (Signed)
Pt is in the bed sleeping. Respirations are even and unlabored. No acute distress noted. Will continue to monitor for safety. 

## 2021-09-16 NOTE — ED Notes (Signed)
Patient arrived on unit. Patient given meal. Patient cooperative. Patient safe on unit with continued monitoring.

## 2021-09-16 NOTE — ED Notes (Signed)
Pt requested for vistaril for anxiety.

## 2021-09-16 NOTE — Progress Notes (Signed)
Received Marcia West in the assessment room with her stepfather. She was compliant with the admission process. She was allowed to write her telephone numbers down and relocated to the unit. She endorsed feeling anxious and depressed, but denied feeling suicidal.She received nourishments per her request and started making phone calls.
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
## 2021-09-17 LAB — GC/CHLAMYDIA PROBE AMP (~~LOC~~) NOT AT ARMC
Chlamydia: NEGATIVE
Comment: NEGATIVE
Comment: NORMAL
Neisseria Gonorrhea: NEGATIVE

## 2021-09-17 LAB — RPR: RPR Ser Ql: NONREACTIVE

## 2021-09-17 MED ORDER — GABAPENTIN 100 MG PO CAPS: 200.0000 mg | ORAL_CAPSULE | Freq: Two times a day (BID) | ORAL | 0 refills | Status: DC

## 2021-09-17 MED ORDER — HYDROXYZINE HCL 25 MG PO TABS: 25.0000 mg | ORAL_TABLET | Freq: Four times a day (QID) | ORAL | 0 refills | Status: DC | PRN

## 2021-09-17 MED ORDER — OLANZAPINE 10 MG PO TBDP
10.0000 mg | ORAL_TABLET | Freq: Every day | ORAL | 0 refills | Status: DC
Start: 2021-09-17 — End: 2021-10-25

## 2021-09-17 MED ORDER — NICOTINE 14 MG/24HR TD PT24: 14.0000 mg | MEDICATED_PATCH | Freq: Every day | TRANSDERMAL | 0 refills | Status: DC

## 2021-09-17 NOTE — ED Provider Notes (Signed)
FBC/OBS ASAP Discharge Summary  Date and Time: 09/17/2021 6:36 PM  Name: Marcia West  MRN:  [id number]   Discharge Diagnoses:  Final diagnoses:  Bipolar I disorder, most recent episode mixed (HCC)    Subjective: patient presented to [facility] as a walk in voluntarily and was admitted to the continuous assessment unit.  Marcia West, 36 y.o., female patient seen face to face by this provider, consulted with Dr. Lucianne Muss; and chart reviewed on 09/05/523.  Per chart review patient has a past psychiatric history of bipolar 1 disorder, anxiety, depression, polysubstance abuse (opioid, alcohol, methadone and cocaine) and trauma.  Reports he is currently prescribed Klonopin, Suboxone, hydroxyzine, and BuSpar by her PCP Dr. Debe Coder.  Reports she has no outpatient psychiatric services in place.  Past medication trials include: Seroquel, Wellbutrin, Prozac, BuSpar, Abilify, lithium, Latuda, and Vraylar.  She self-reports a history of 6 inpatient psychiatric admissions with her last admission at [facility] 01/2021.   During evaluation Marcia West is observed sitting in her bed in no acute distress.  She is alert/oriented x4 cooperative, and attentive.  She has normal speech and behavior.  She is eager to talk.  She is requesting to be discharged.  Reports she was able to sleep roughly 4 hours last night and considering the lights not being turned off and the noise she considers it accomplishment.  She feels rested and does appear to have a brighter affect.  She continues to endorse depression and anxiety but states she feels that it has improved since taking the Zyprexa. She is denying any auditory or visual hallucinations, states they have resolved.  She contributes her AVH to lack of sleep.  Objectively she does not appear to be responding to internal/external stimuli.  Reports her mind is no longer racing and that her, "thoughts have calmed down".  She continues to deny SI/HI.  She easily contracts for  safety and denies any access to firearms/weapons.  She identifies her children and family as her protective factors.  She is eager to continue treatment.  States she has contacted [facility] today and has an appointment with them this afternoon concerning their outpatient services that will include CD IOP.  Patient provided permission to talk to her stepfather.  Larina Earthly.  Bernette Redbird states he has no immediate safety concerns due to patient's safety or the safety of others.  He is thankful that she has restarted medications and states he will take her to [facility] today to continue outpatient treatment.  Discussed safety planning.  Educated patient and stepfather that for the next few days while patient is adjusting to the medications that she should not drive.  Kidney states that patient will not have access to a car and that he will make sure that her interactions with the children are  monitored.  States she will be monitored for the next few days.  Both agree that if patient's mental health/symptoms were to worsen that they should immediately represent to [facility], call 911, or mobile crisis line or present to the nearest emergency room.  Both are in agreement.   Stay Summary: Patient remained calm and cooperative while on the unit.  She interacted appropriately with staff and other patients.  She has been compliant with medications.  She continued to deny SI/HI/AVH while on the unit.  She exhibited no unsafe behaviors. She was treated with Zyprexa 5 mg, gabapentin 200 mg twice daily, hydroxyzine 25 mg 3 times daily as needed, and nicotine patch 14 mg,  which were tolerated with no adverse reactions.   Marcia West was discharged with current medication and was instructed on how to take medications as prescribed; a 30-day prescription was sent to patient's pharmacy of choice.  Her improvement was monitored by continuous assessment/observation and her report of symptom reduction.   She agrees to  follow up with the services as listed below under Follow up Information.    Upon completion of this admission the [employer] was both mentally and medically stable for discharge denying suicidal/homicidal ideation, auditory/visual/tactile hallucinations, delusional thoughts and paranoia.      Total Time spent with patient: 30 minutes  Past Psychiatric History: See H&P Past Medical History:  Past Medical History:  Diagnosis Date   Anxiety    Bipolar affective (HCC)    Depression    Herpes simplex type II infection    Substance abuse (HCC)     Past Surgical History:  Procedure Laterality Date   NO PAST SURGERIES     WISDOM TOOTH EXTRACTION     Family History:  Family History  Problem Relation Age of Onset   Arthritis Maternal Grandmother    Diabetes Maternal Grandmother    Diabetes Maternal Grandfather    Obesity Paternal Grandmother    Family Psychiatric History: The H&P Social History:  Social History   Substance and Sexual Activity  Alcohol Use Not Currently     Social History   Substance and Sexual Activity  Drug Use Not Currently   Types: Methamphetamines, Other-see comments, Marijuana, "Crack" cocaine   Comment: suboxone daily    Social History   Socioeconomic History   Marital status: Single    Spouse name: Not on file   Number of children: Not on file   Years of education: Not on file   Highest education level: Not on file  Occupational History   Not on file  Tobacco Use   Smoking status: Every Day    Packs/day: 0.25    Types: Cigarettes   Smokeless tobacco: Never   Tobacco comments:    4 cigs per day  Vaping Use   Vaping Use: Never used  Substance and Sexual Activity   Alcohol use: Not Currently   Drug use: Not Currently    Types: Methamphetamines, Other-see comments, Marijuana, "Crack" cocaine    Comment: suboxone daily   Sexual activity: Not Currently    Birth control/protection: None  Other Topics Concern   Not on file  Social  History Narrative   Not on file   Social Determinants of Health   Financial Resource Strain: Not on file  Food Insecurity: Not on file  Transportation Needs: Not on file  Physical Activity: Not on file  Stress: Not on file  Social Connections: Not on file   SDOH:  SDOH Screenings   Alcohol Screen: Medium Risk (01/19/2020)  Depression (PHQ2-9): Low Risk  (06/19/2021)  Tobacco Use: High Risk (06/19/2021)    Tobacco Cessation:  A prescription for an FDA-approved tobacco cessation medication provided at discharge  Current Medications:  No current facility-administered medications for this encounter.   Current Outpatient Medications  Medication Sig Dispense Refill   gabapentin (NEURONTIN) 100 MG capsule Take 2 capsules (200 mg total) by mouth 2 (two) times daily. 120 capsule 0   hydrOXYzine (ATARAX) 25 MG tablet Take 1 tablet (25 mg total) by mouth every 6 (six) hours as needed for anxiety. 90 tablet 0   [START ON 09/18/2021] nicotine (NICODERM CQ - DOSED IN MG/24 HOURS) 14 mg/24hr patch  Place 1 patch (14 mg total) onto the skin daily at 6 (six) AM. 28 patch 0   OLANZapine zydis (ZYPREXA) 10 MG disintegrating tablet Take 1 tablet (10 mg total) by mouth at bedtime. 30 tablet 0    PTA Medications: (Not in a hospital admission)      06/19/2021    3:49 PM 02/19/2021    9:32 AM 12/25/2020    5:05 PM  Depression screen PHQ 2/9  Decreased Interest 0 2 3  Down, Depressed, Hopeless 0 1 3  PHQ - 2 Score 0 3 6  Altered sleeping  1 2  Tired, decreased energy  3 1  Change in appetite  0 0  Feeling bad or failure about yourself   2 3  Trouble concentrating  1 1  Moving slowly or fidgety/restless  0 0  Suicidal thoughts  0 1  PHQ-9 Score  10 14  Difficult doing work/chores  Somewhat difficult Extremely dIfficult    Flowsheet Row ED from 09/16/2021 in [facility] Most recent reading at 09/16/2021 12:40 PM Admission (Discharged) from 01/19/2020 in [facility]  [facility]HAVIORAL MEDICINE Most recent reading at 01/20/2020 12:00 AM ED from 01/19/2020 in [facility] Most recent reading at 01/19/2020 11:56 AM  C-SSRS RISK CATEGORY No Risk High Risk High Risk       Musculoskeletal  Strength & Muscle Tone: within normal limits Gait & Station: normal Patient leans: N/A  Psychiatric Specialty Exam  Presentation  General Appearance: Appropriate for Environment; Casual  Eye Contact:Good  Speech:Clear and Coherent; Normal Rate  Speech Volume:Normal  Handedness:Right   Mood and Affect  Mood:Anxious; Depressed  Affect:Congruent   Thought Process  Thought Processes:Coherent  Descriptions of Associations:Intact  Orientation:Full (Time, Place and Person)  Thought Content:Logical  Diagnosis of Schizophrenia or Schizoaffective disorder in past: No  Duration of Psychotic Symptoms: Greater than six months   Hallucinations:Hallucinations: None Description of Auditory Hallucinations: hears birds and people who are calling her name. Description of Visual Hallucinations: sees people and shadows in her yard  Ideas of Reference:None  Suicidal Thoughts:Suicidal Thoughts: No  Homicidal Thoughts:Homicidal Thoughts: No   Sensorium  Memory:Immediate Good; Recent Good; Remote Good  Judgment:Good  Insight:Good   Executive Functions  Concentration:Good  Attention Span:Good  Recall:Good  Fund of Knowledge:Good  Language:Good   Psychomotor Activity  Psychomotor Activity:Psychomotor Activity: Normal   Assets  Assets:Communication Skills; Desire for Improvement; Financial Resources/Insurance; Housing; Physical Health; Resilience; Agricultural engineer; Vocational/Educational   Sleep  Sleep:Sleep: Fair Number of Hours of Sleep: 4   Nutritional Assessment (For OBS and FBC admissions only) Has the patient had a weight loss or gain of 10 pounds or more in the last 3 months?: No Has the  patient had a decrease in food intake/or appetite?: Yes Does the patient have dental problems?: No Does the patient have eating habits or behaviors that may be indicators of an eating disorder including binging or inducing vomiting?: No Has the patient recently lost weight without trying?: 2.0 Has the patient been eating poorly because of a decreased appetite?: 1 Malnutrition Screening Tool Score: 3    Physical Exam  Physical Exam Vitals and nursing note reviewed.  Constitutional:      General: She is not in acute distress.    Appearance: Normal appearance. She is not ill-appearing.  HENT:     Head: Normocephalic.  Eyes:     General:        Right eye: No discharge.  Left eye: No discharge.     Conjunctiva/sclera: Conjunctivae normal.     Pupils: Pupils are equal, round, and reactive to light.  Cardiovascular:     Rate and Rhythm: Normal rate.  Pulmonary:     Effort: Pulmonary effort is normal.  Musculoskeletal:        General: Normal range of motion.     Cervical back: Normal range of motion.  Skin:    General: Skin is warm and dry.  Neurological:     Mental Status: She is alert and oriented to person, place, and time.  Psychiatric:        Attention and Perception: Attention and perception normal.        Mood and Affect: Affect normal. Mood is anxious and depressed.        Speech: Speech normal.        Behavior: Behavior normal. Behavior is cooperative.        Thought Content: Thought content normal.        Cognition and Memory: Cognition normal.        Judgment: Judgment normal.    Review of Systems  Constitutional: Negative.   HENT: Negative.    Eyes: Negative.   Respiratory: Negative.    Cardiovascular: Negative.   Musculoskeletal: Negative.   Skin: Negative.   Neurological: Negative.   Psychiatric/Behavioral:  Positive for depression. The patient is nervous/anxious.    Blood pressure 123/82, pulse 63, temperature 98.5 F (36.9 C), resp. rate 16,  height 5\' 7"  (1.702 m), weight 130 lb (59 kg), SpO2 100 %. Body mass index is 20.36 kg/m.  Demographic Factors:  Caucasian  Loss Factors: NA  Historical Factors: NA  Risk Reduction Factors:   Responsible for children under 66 years of age, Sense of responsibility to family, Employed, Living with another person, especially a relative, Positive social support, Positive therapeutic relationship, and Positive coping skills or problem solving skills  Continued Clinical Symptoms:  Severe Anxiety and/or Agitation Bipolar Disorder:   Mixed State Depression:   Impulsivity More than one psychiatric diagnosis Previous Psychiatric Diagnoses and Treatments  Cognitive Features That Contribute To Risk:  None    Suicide Risk:  Minimal: No identifiable suicidal ideation.  Patients presenting with no risk factors but with morbid ruminations; may be classified as minimal risk based on the severity of the depressive symptoms  Plan Of Care/Follow-up recommendations:  Activity:  As tolerated Diet:  Regular  Disposition: Discharge patient.  She has an appointment to follow-up with [facility] in [place] today.  Prescription for Zyprexa 5 mg, gabapentin 200 mg twice daily, hydroxyzine 25 mg 3 times daily as needed, and nicotine patch 14 mg, sent to patient's pharmacy of choice.  No evidence of imminent risk to self or others at present.    Patient does not meet criteria for psychiatric inpatient admission. Discussed crisis plan, support from social network, calling 911, coming to the Emergency Department, and calling Suicide Hotline.    [place], NP 09/17/2021, 6:36 PM

## 2021-09-17 NOTE — ED Notes (Signed)
Pt is in the bed sleeping. Respirations are even and unlabored. No acute distress noted. Will continue to monitor for safety. 

## 2021-09-17 NOTE — ED Notes (Signed)
Patient A&Ox4. Patient denies SI/AVH. Patient reports passive HI denies intent or plan. Patient refused to elaborate on the person who the HI is directed towards. Patient denies any physical complaints when asked. No acute distress noted. Support and encouragement provided. Routine safety checks conducted according to facility protocol. Encouraged patient to notify staff if thoughts of harm toward self or others arise. Patient verbalize understanding and agreement. Will continue to monitor for safety.

## 2021-09-17 NOTE — ED Notes (Signed)
Patient A&O x 4, ambulatory. Patient discharged in no acute distress. Patient denied SI/HI, A/VH upon discharge. Patient verbalized understanding of all discharge instructions explained by staff, to include follow up appointments, RX's and safety plan. Patient reported mood 10/10.  Pt belongings returned to patient from locker # 11  intact. Patient escorted to lobby via staff for transport to destination. Safety maintained.  

## 2021-09-17 NOTE — ED Notes (Signed)
Patient resting quietly in bed with eyes closed. Respirations equal and unlabored, skin warm and dry, NAD. No change in assessment or acuity. Routine safety checks conducted according to facility protocol. Will continue to monitor for safety.   

## 2021-09-17 NOTE — Discharge Instructions (Addendum)
The suicide prevention education provided includes the following: Suicide risk factors Suicide prevention and interventions National Suicide Hotline telephone number [facility] assessment telephone number [place] Emergency Assistance 911 County and/or Residential Mobile Crisis Unit telephone number   Request made of family/significant other to: Remove weapons (e.g., guns, rifles, knives), all items previously/currently identified as safety concern.   Remove drugs/medications (over the counter, prescriptions, illicit drugs), all items previously/currently identified as a safety concern.  

## 2021-09-17 NOTE — ED Notes (Signed)
Pt came to nurse and requested for vistaril for anxiety.

## 2021-10-25 ENCOUNTER — Ambulatory Visit (INDEPENDENT_AMBULATORY_CARE_PROVIDER_SITE_OTHER): Payer: Medicaid Other

## 2021-10-25 ENCOUNTER — Other Ambulatory Visit (HOSPITAL_COMMUNITY)
Admission: RE | Admit: 2021-10-25 | Discharge: 2021-10-25 | Disposition: A | Discharge status: Home / Self Care | Payer: Medicaid Other | Source: Ambulatory Visit | Attending: Internal Medicine | Admitting: Internal Medicine

## 2021-10-25 ENCOUNTER — Other Ambulatory Visit: Payer: Self-pay

## 2021-10-25 VITALS — BP 121/84 | HR 83 | Temp 98.3°F | Ht 67.0 in | Wt 141.9 lb

## 2021-10-25 DIAGNOSIS — Z3202 Encounter for pregnancy test, result negative: Secondary | ICD-10-CM

## 2021-10-25 DIAGNOSIS — F32A Depression, unspecified: Secondary | ICD-10-CM | POA: Diagnosis not present

## 2021-10-25 DIAGNOSIS — F1190 Opioid use, unspecified, uncomplicated: Secondary | ICD-10-CM | POA: Diagnosis present

## 2021-10-25 DIAGNOSIS — Z0000 Encounter for general adult medical examination without abnormal findings: Secondary | ICD-10-CM | POA: Insufficient documentation

## 2021-10-25 DIAGNOSIS — R21 Rash and other nonspecific skin eruption: Secondary | ICD-10-CM | POA: Insufficient documentation

## 2021-10-25 DIAGNOSIS — F419 Anxiety disorder, unspecified: Secondary | ICD-10-CM | POA: Diagnosis not present

## 2021-10-25 LAB — POCT URINE PREGNANCY: Preg Test, Ur: NEGATIVE

## 2021-10-25 NOTE — Assessment & Plan Note (Addendum)
Patient is presenting with a rash on her right lower extremity today that she first noticed yesterday. Patient states that she has been hiking daily over the last week. She states that she was wearing pants during these hikes but that her pants were very thin. She believes she saw poison ivy plants near where she was hiking but is not sure.  She states that she has had poison ivy in the past and that this rash appears similar. Her rash was initially pruritic which has since resolved. She initially noticed small fluid filled bumps on her rash that have also resolved. Patient denies associated drainage, bleeding, or pain. Denies fever, chills, or joint pain. Patient denies using any new products like lotions or soaps. Patient has been putting hand sanitizer on her rash without relief. Discussed that hand sanitizer may irritate this area and will likely not help. On exam, patient has a flat linear 8 cm erythematous rash on the right medial thigh without localized swelling, drainage, or bleeding. Likely localized contact dermatitis.    Plan: - Discussed trying OTC cortisone cream for her rash

## 2021-10-25 NOTE — Patient Instructions (Addendum)
Thank you for coming to see Marcia West in clinic [name].  Plan: - Please continue suboxone 2-0.5 mg twice daily   - Please continue buspar 15 mg three times daily for your anxiety and depression - Please stop taking Quetiapine 100 mg at home if you feel it is too sedating and are able to sleep comfortably without this medication - Please follow up with [facility] at your next scheduled appointment  - Please try over the counter cortizone cream for your rash  - We got urine from you today to perform a pregnancy test (we will call you with these results) - Given your irregular menstrual pattern and how recently the incident was, we recommend you retest yourself at home in the next 2-3 weeks  - We performed a pap smear today and will call you with these results  - We performed sexually transmitted infection testing today and will call you with these results  It was very nice to meet you.

## 2021-10-25 NOTE — Assessment & Plan Note (Signed)
Patient also has a history of anxiety and depression. Current medications include buspar 15 mg TID and Quetiapine 100 mg. Patient states that she is taking both of these medications but that the Quetiapine is quite sedating for her.  She states that she has been sleeping well without the quetiapine recently. Patient was previously prescribed bupropion 150 mg BID, olanzapine 10 mg, hydroxyzine 25 mg, and trazodone 50 mg but is no longer taking these medications. Patient states that her anxiety and depression have been very much improved recently. She is still following up with [facility] and has an appointment later this month. PHQ9 today is 6. GAD-7 today is 9.   Plan: - Continue buspar 15 mg TID  - Discontinue Quetiapine 100 mg - F/u with [facility]

## 2021-10-25 NOTE — Progress Notes (Signed)
CC: F/u for OUD  HPI:  [name] is a 36 y.o. female with past medical history of OUD, ETOH and cocaine abuse, anxiety,depression, insomnia, and bipolar 1 that presents for f/u of OUD.   Current medications include suboxone 2-0.5 mg twice daily. Patient states that this medication helps with her cravings. Denies any withdrawal symptoms including palpitations, nausea, vomiting, diarrhea, diaphoresis. Denies relapse.   Patient also has a history of anxiety and depression. Current medications include buspar 15 mg TID and Quetiapine 100 mg. Patient states that she is taking both of these medications but that the Quetiapine is quite sedating for her.  She states that she has been sleeping well without the quetiapine recently. Patient was previously prescribed bupropion 150 mg BID, olanzapine 10 mg, hydroxyzine 25 mg, and trazodone 50 mg but is no longer taking these medications. Patient states that her anxiety and depression have been very much improved recently. She is still following up with [facility] and has an appointment later this month.   Patient is presenting with a rash on her right lower extremity today that she first noticed yesterday. Patient states that she has been hiking daily over the last week. She states that she was wearing pants during these hikes but that her pants were very thin. She believes she saw poison ivy plants near where she was hiking but is not sure.  She states that she has had poison ivy in the past and that this rash appears similar. Her rash was initially pruritic which has since resolved. She initially noticed small fluid filled bumps on her rash that have also resolved. Patient denies associated drainage, bleeding, or pain. Denies fever, chills, or joint pain. Patient denies using any new products like lotions or soaps. Patient has been putting hand sanitizer on her rash without relief.   Patient also states that her boyfriend's condom broke 1 week ago while  engaging in sexual activity.  She is concerned for pregnancy and is requesting a pregnancy test today. Patient states that her menstrual cycles are irregular in frequency with her last menstrual cycle occurring in early 09/2021.  Patient is also requesting STI testing as well as a pap smear today.  Allergies as of 10/25/2021   No Known Allergies      Medication List        Accurate as of October 25, 2021  8:01 AM. If you have any questions, ask your nurse or doctor.          gabapentin 100 MG capsule Commonly known as: NEURONTIN Take 2 capsules (200 mg total) by mouth 2 (two) times daily.   hydrOXYzine 25 MG tablet Commonly known as: ATARAX Take 1 tablet (25 mg total) by mouth every 6 (six) hours as needed for anxiety.   nicotine 14 mg/24hr patch Commonly known as: NICODERM CQ - dosed in mg/24 hours Place 1 patch (14 mg total) onto the skin daily at 6 (six) AM.   OLANZapine zydis 10 MG disintegrating tablet Commonly known as: ZYPREXA Take 1 tablet (10 mg total) by mouth at bedtime.         Past Medical History:  Diagnosis Date   Anxiety    Bipolar affective (HCC)    Depression    Herpes simplex type II infection    Substance abuse (HCC)    Review of Systems:  per HPI.   Physical Exam: Vitals:   10/25/21 1015  BP: 121/84  Pulse: 83  Temp: 98.3 F (36.8 C)  TempSrc:  Oral  SpO2: 100%  Weight: 141 lb 14.4 oz (64.4 kg)  Height: 5\' 7"  (1.702 m)   Constitutional: appears comfortable  Cardiovascular: Regular rate, regular rhythm. No murmurs, rubs, or gallops. Normal radial and PT pulses bilaterally. No LE edema.  Pulmonary: Normal respiratory effort. No wheezes, rales, or rhonchi.   Abdominal: Soft. Non-distended. No tenderness. Normal bowel sounds.  Genital: thin white drainage present within the vaginal vault and at the cervix on speculum exam. No bleeding or masses noted.  Musculoskeletal: Normal range of motion.     Neurological: Alert and oriented to  person, place, and time. Non-focal. Skin: warm and dry. Flat linear 8 cm erythematous rash on the right medial thigh. No localized swelling, drainage, or bleeding.   Assessment & Plan:   See Encounters Tab for problem based charting.  Patient seen with Dr. Philipp Ovens

## 2021-10-25 NOTE — Assessment & Plan Note (Signed)
Patient states that her boyfriend's condom broke 1 week ago while engaging in sexual activity.  She is concerned for pregnancy and is requesting a pregnancy test today. Patient states that her menstrual cycles are irregular in frequency with her last menstrual cycle occurring in early 09/2021.  Patient is also requesting STI testing as well as a pap smear today. Discussed that pregnancy testing today may be too early to be positive and that she can retest herself at home in the next few weeks.   Plan: - Pap smear and STI testing performed today - Pregnancy test done today  Addendum (10/31/2021): - Pap smear positive for LSIL. Current guidelines recommend repeat screening in 1 year. Called patient and discussed plan. Patient agrees with the plan.

## 2021-10-25 NOTE — Assessment & Plan Note (Signed)
Current medications include suboxone 2-0.5 mg twice daily. Patient states that this medication helps with her cravings. Denies any withdrawal symptoms including palpitations, nausea, vomiting, diarrhea, diaphoresis. Denies relapse. Toxassure from  06/2021 appropriate.  Plan: - Toxassure today - Continue suboxone 2-0.5 mg twice daily

## 2021-10-28 LAB — CERVICOVAGINAL ANCILLARY ONLY
Bacterial Vaginitis (gardnerella): NEGATIVE
Candida Glabrata: NEGATIVE
Candida Vaginitis: POSITIVE — AB
Chlamydia: NEGATIVE
Comment: NEGATIVE
Comment: NEGATIVE
Comment: NEGATIVE
Comment: NEGATIVE
Comment: NEGATIVE
Comment: NORMAL
Neisseria Gonorrhea: NEGATIVE
Trichomonas: NEGATIVE

## 2021-10-28 NOTE — Progress Notes (Signed)
Internal Medicine Clinic Attending  I saw and evaluated the patient.  I personally confirmed the key portions of the history and exam documented by Dr. Mapp and I reviewed pertinent patient test results.  The assessment, diagnosis, and plan were formulated together and I agree with the documentation in the resident's note.  

## 2021-10-30 LAB — CYTOLOGY - PAP
Comment: NEGATIVE
High risk HPV: NEGATIVE

## 2021-10-30 LAB — TOXASSURE SELECT,+ANTIDEPR,UR

## 2021-10-31 NOTE — Progress Notes (Signed)
Called patient and discussed results. Will continue with suboxone 2-0.5 mg twice daily.

## 2021-10-31 NOTE — Progress Notes (Signed)
Called patient and updated on results.

## 2021-10-31 NOTE — Progress Notes (Signed)
Called patient and updated on results positive for LSIL. Discussed that current guidelines recommend repeat screening in 1 year. Patient agrees with this plan.

## 2021-10-31 NOTE — Progress Notes (Signed)
Called patient and updated on results positive for LSIL. Discussed that current guidelines recommend repeat screening in 1 year. Patient agrees with this plan.  

## 2021-10-31 NOTE — Progress Notes (Signed)
Vaginal swab positive for candida. Called patient and updated on results. Patient is currently asymptomatic. Current guidelines do not recommend treating if asymptomatic. Discussed with the patient that we will not start treatment but to call the clinic if she becomes symptomatic.

## 2021-11-15 ENCOUNTER — Ambulatory Visit (INDEPENDENT_AMBULATORY_CARE_PROVIDER_SITE_OTHER): Payer: Medicaid Other | Admitting: Student

## 2021-11-15 DIAGNOSIS — Z30011 Encounter for initial prescription of contraceptive pills: Secondary | ICD-10-CM

## 2021-11-15 DIAGNOSIS — Z3009 Encounter for other general counseling and advice on contraception: Secondary | ICD-10-CM | POA: Insufficient documentation

## 2021-11-15 NOTE — Progress Notes (Signed)
  [facility] Internal Medicine Residency Telephone Encounter Continuity Care Appointment  HPI:  This telephone encounter was created for Ms. Tracye Etzkorn on 11/15/2021 for the following purpose/cc: birth control. Spoke with Ms. Mccall via telephone call, verified identity via DOB and home address. She states that she would like to start on birth control. She is currently sexually active with 1 partner and uses condoms for barrier protection. She has irregular menstrual cycles that typically come on every 5-6 weeks but sometimes longer intervals in between. She does have a history of amenorrhea for about 7 months last year that was thought to be due to [place]. Her menstrual cycles spontaneously restarted after 7 months without any intervention. She does endorse heavy bleeding with her cycles. Of note, patient is age 36 and currently uses tobacco (smokes about 10-12 cigarettes/day, interested in quitting).  She was seen in our clinic 3 weeks ago after having an accident where condom broke during sexual activity. She had negative pregnancy testing along with negative STI testing at that time. She did have a PAP smear showing LSIL.  Patient would not like implantable contraceptives or vaginal ring. She has tried OCPs in the past and would like to start this again. We discussed risks of OCPs given concomitant smoking history, she would like to cut back and eventually quit tobacco use. Fortunately, no history of VTE, cancer, or significant CV risk factors.  Will have patient come in for an in-person visit to obtain a pregnancy test, counseling on substance use, and to discuss current medications to ensure no interactions with OCPs.   Past Medical History:  Past Medical History:  Diagnosis Date   Anxiety    Bipolar affective ([place])    Depression    Herpes simplex type II infection    Substance abuse ([place])      ROS:  Negative aside from that in HPI.   Assessment / Plan / Recommendations:  Please  see A&P under problem oriented charting for assessment of the patient's acute and chronic medical conditions.  As always, pt is advised that if symptoms worsen or new symptoms arise, they should go to an urgent care facility or to to ER for further evaluation.   Consent and Medical Decision Making:  Patient discussed with Dr. Dareen Piano This is a telephone encounter between Steward Ros and Virl Axe on 11/15/2021 for birth control. The visit was conducted with the patient located at home and Virl Axe at [facility]. The patient's identity was confirmed using their DOB and current address. The patient has consented to being evaluated through a telephone encounter and understands the associated risks (an examination cannot be done and the patient may need to come in for an appointment) / benefits (allows the patient to remain at home, decreasing exposure to coronavirus). I personally spent 15 minutes on medical discussion.

## 2021-11-15 NOTE — Assessment & Plan Note (Signed)
Spoke with Ms. Apfel via telephone call, verified identity via DOB and home address. She states that she would like to start on birth control. She is currently sexually active with 1 partner and uses condoms for barrier protection. She has irregular menstrual cycles that typically come on every 5-6 weeks but sometimes longer intervals in between. She does have a history of amenorrhea for about 7 months last year that was thought to be due to [place]. Her menstrual cycles spontaneously restarted after 7 months without any intervention. She does endorse heavy bleeding with her cycles. Of note, patient is age 36 and currently uses tobacco (smokes about 10-12 cigarettes/day, interested in quitting).   She was seen in our clinic 3 weeks ago after having an accident where condom broke during sexual activity. She had negative pregnancy testing along with negative STI testing at that time. She did have a PAP smear showing LSIL.   Patient would not like implantable contraceptives or vaginal ring. She has tried OCPs in the past and would like to start this again. We discussed risks of OCPs given concomitant smoking history, she would like to cut back and eventually quit tobacco use. Fortunately, no history of VTE, cancer, or significant CV risk factors.   Will have patient come in for an in-person visit to obtain a pregnancy test, counseling on substance use, and to discuss current medications to ensure no interactions with OCPs. If unable to have patient wean/quit tobacco use, may need to consider progestin-only pills for contraception.  Plan: -in-person visit in 1 week -obtain urine pregnancy test -substance use counseling (especially alcohol use and tobacco use) -review current medications to ensure no interactions with OCPs

## 2021-11-27 ENCOUNTER — Other Ambulatory Visit: Payer: Self-pay | Admitting: *Deleted

## 2021-11-28 ENCOUNTER — Other Ambulatory Visit: Payer: Self-pay

## 2021-11-28 ENCOUNTER — Other Ambulatory Visit: Payer: Self-pay | Admitting: Internal Medicine

## 2021-11-28 MED ORDER — BUPRENORPHINE HCL-NALOXONE HCL 2-0.5 MG SL FILM: 1.0000 | ORAL_FILM | Freq: Two times a day (BID) | SUBLINGUAL | 0 refills | Status: DC

## 2021-11-29 ENCOUNTER — Other Ambulatory Visit: Payer: Self-pay | Admitting: Student

## 2021-11-29 ENCOUNTER — Encounter: Payer: Self-pay | Admitting: Student

## 2021-11-29 ENCOUNTER — Telehealth: Payer: Self-pay | Admitting: Student

## 2021-11-29 ENCOUNTER — Ambulatory Visit (INDEPENDENT_AMBULATORY_CARE_PROVIDER_SITE_OTHER): Payer: Medicaid Other | Admitting: Student

## 2021-11-29 ENCOUNTER — Other Ambulatory Visit: Payer: Self-pay

## 2021-11-29 VITALS — BP 122/75 | HR 81 | Temp 98.2°F | Ht 67.0 in | Wt 138.3 lb

## 2021-11-29 DIAGNOSIS — F17210 Nicotine dependence, cigarettes, uncomplicated: Secondary | ICD-10-CM | POA: Diagnosis not present

## 2021-11-29 DIAGNOSIS — Z3009 Encounter for other general counseling and advice on contraception: Secondary | ICD-10-CM | POA: Diagnosis present

## 2021-11-29 DIAGNOSIS — F17200 Nicotine dependence, unspecified, uncomplicated: Secondary | ICD-10-CM

## 2021-11-29 DIAGNOSIS — F1190 Opioid use, unspecified, uncomplicated: Secondary | ICD-10-CM

## 2021-11-29 DIAGNOSIS — Z3202 Encounter for pregnancy test, result negative: Secondary | ICD-10-CM

## 2021-11-29 LAB — POCT URINE PREGNANCY: Preg Test, Ur: NEGATIVE

## 2021-11-29 MED ORDER — NICOTINE 14 MG/24HR TD PT24: 14.0000 mg | MEDICATED_PATCH | Freq: Every day | TRANSDERMAL | 1 refills | Status: DC

## 2021-11-29 MED ORDER — BUPRENORPHINE HCL-NALOXONE HCL 4-1 MG SL FILM: 1.0000 | ORAL_FILM | Freq: Two times a day (BID) | SUBLINGUAL | 0 refills | Status: DC

## 2021-11-29 MED ORDER — NORGESTIMATE-ETH ESTRADIOL 0.25-35 MG-MCG PO TABS: 1.0000 | ORAL_TABLET | Freq: Every day | ORAL | 11 refills | Status: DC

## 2021-11-29 MED ORDER — NICOTINE POLACRILEX 4 MG MT GUM: 4.0000 mg | CHEWING_GUM | OROMUCOSAL | 1 refills | Status: DC | PRN

## 2021-11-29 NOTE — Telephone Encounter (Signed)
TC to patient states suboxone increase to 4mg  twice daily films. [facility] [place] [facility]. Canceled script from today and [facility] [place].

## 2021-11-29 NOTE — Progress Notes (Signed)
   CC: birth control counseling, OUD f/u  HPI:  [name] is a 36 y.o. female with history listed below presenting to the [facility] for birth control counseling, OUD f/u. Please see individualized problem based charting for full HPI.  Past Medical History:  Diagnosis Date   Anxiety    Bipolar affective (HCC)    Depression    Herpes simplex type II infection    Substance abuse (HCC)     Review of Systems:  Negative aside from that listed in individualized problem based charting.  Physical Exam:  Vitals:   11/29/21 1006  BP: 122/75  Pulse: 81  Temp: 98.2 F (36.8 C)  TempSrc: Oral  SpO2: 100%  Weight: 138 lb 4.8 oz (62.7 kg)  Height: 5\' 7"  (1.702 m)   Physical Exam Constitutional:      Appearance: Normal appearance. She is normal weight. She is not ill-appearing.  HENT:     Nose: Nose normal. No congestion.     Mouth/Throat:     Mouth: Mucous membranes are moist.     Pharynx: Oropharynx is clear.  Eyes:     Extraocular Movements: Extraocular movements intact.     Conjunctiva/sclera: Conjunctivae normal.     Pupils: Pupils are equal, round, and reactive to light.  Cardiovascular:     Rate and Rhythm: Normal rate and regular rhythm.     Pulses: Normal pulses.     Heart sounds: Normal heart sounds. No murmur heard.    No friction rub. No gallop.  Pulmonary:     Effort: Pulmonary effort is normal.     Breath sounds: Normal breath sounds. No wheezing, rhonchi or rales.  Abdominal:     General: Bowel sounds are normal. There is no distension.     Palpations: Abdomen is soft.     Tenderness: There is no abdominal tenderness.  Musculoskeletal:        General: No swelling. Normal range of motion.  Skin:    General: Skin is warm and dry.  Neurological:     General: No focal deficit present.     Mental Status: She is alert and oriented to person, place, and time.  Psychiatric:        Mood and Affect: Mood normal.        Behavior: Behavior normal.       Assessment & Plan:   See Encounters Tab for problem based charting.  Patient discussed with Dr. 

## 2021-11-29 NOTE — Addendum Note (Signed)
Addended by: Merrilyn Puma on: 11/29/2021 02:24 PM   Modules accepted: Orders

## 2021-11-29 NOTE — Assessment & Plan Note (Signed)
Marcia West presents for counseling on birth control. Please refer to my note from 11/15/2021 for full details. She has cut back on tobacco use to about 8 cigarettes/day and is interest in quitting so will initiate NRT to help with this. Urine pregnancy test today is negative. We will initiate oral contraceptive therapy with Sprintec.  Plan: -negative urine pregnancy test -initiate OCP therapy with Sprintec -f/u in 1 month to ensure tolerance -NRT to help with tobacco use cessation

## 2021-11-29 NOTE — Assessment & Plan Note (Signed)
Currently smoking about 8 cigarettes/day. She is interested in quitting. We discussed options and ultimately agreed on NRT with nicotine patch and nicorette gum.   Plan: -ordered patch and gum -continue counseling on tobacco use cessation at subsequent visits

## 2021-11-29 NOTE — Patient Instructions (Signed)
Marcia West,  It was a pleasure seeing you in the clinic today.   For birth control: your pregnancy test came back negative. I have prescribed an oral contraceptive pill called Sprintec. Please take this as prescribed. I have ordered nicotine patches and gum to help with your nicotine cravings and to help cut down on cigarette smoking as we discussed. We increased your suboxone dose to 4mg  films twice a day to help better control your symptoms.  Please follow up in 1 month to make sure you are tolerating these fine.  Please call our clinic at [phone] if you have any questions or concerns. The best time to call is Monday-Friday from 9am-4pm, but there is someone available 24/7 at the same number. If you need medication refills, please notify your pharmacy one week in advance and they will send [phone] a request.   Thank you for letting us take part in your care. We look forward to seeing you next time!

## 2021-11-29 NOTE — Progress Notes (Signed)
Internal Medicine Clinic Attending  Case discussed with Dr. Jinwala  At the time of the visit.  We reviewed the resident's history and exam and pertinent patient test results.  I agree with the assessment, diagnosis, and plan of care documented in the resident's note.  

## 2021-11-29 NOTE — Assessment & Plan Note (Signed)
Patient with OUD in remission, currently taking suboxone 2-0.5mg  BID. She reports that she has been having withdrawal symptoms (sneezing, runny nose, shakiness) around mid-way through her scheduled doses. She takes half a film in the morning and half a film at night with her withdrawal symptoms occurring in the afternoon and overnight. She has also been having mild cravings as well, but denies any relapses. We agreed to increase her suboxone dose to 4-1mg  films BID to help better control her symptoms.  Plan: -increase suboxone to 4-1mg  BID (given 30-day supply) -f/u in 1 month to ensure she is no longer having withdrawal symptoms -ToxAssure at next visit

## 2021-12-02 NOTE — Progress Notes (Signed)
Internal Medicine Clinic Attending  Case discussed with Dr. Jinwala  At the time of the visit.  We reviewed the resident's history and exam and pertinent patient test results.  I agree with the assessment, diagnosis, and plan of care documented in the resident's note.  

## 2021-12-27 ENCOUNTER — Ambulatory Visit (INDEPENDENT_AMBULATORY_CARE_PROVIDER_SITE_OTHER): Payer: Medicaid Other | Admitting: Internal Medicine

## 2021-12-27 ENCOUNTER — Other Ambulatory Visit: Payer: Self-pay

## 2021-12-27 ENCOUNTER — Encounter: Payer: Self-pay | Admitting: Internal Medicine

## 2021-12-27 VITALS — BP 121/69 | HR 77 | Temp 98.2°F | Ht 67.0 in | Wt 143.9 lb

## 2021-12-27 DIAGNOSIS — F17200 Nicotine dependence, unspecified, uncomplicated: Secondary | ICD-10-CM

## 2021-12-27 DIAGNOSIS — Z3009 Encounter for other general counseling and advice on contraception: Secondary | ICD-10-CM | POA: Diagnosis present

## 2021-12-27 DIAGNOSIS — Z79899 Other long term (current) drug therapy: Secondary | ICD-10-CM | POA: Diagnosis not present

## 2021-12-27 DIAGNOSIS — F1190 Opioid use, unspecified, uncomplicated: Secondary | ICD-10-CM

## 2021-12-27 DIAGNOSIS — F17210 Nicotine dependence, cigarettes, uncomplicated: Secondary | ICD-10-CM | POA: Diagnosis not present

## 2021-12-27 DIAGNOSIS — Z3202 Encounter for pregnancy test, result negative: Secondary | ICD-10-CM | POA: Diagnosis not present

## 2021-12-27 DIAGNOSIS — Z3041 Encounter for surveillance of contraceptive pills: Secondary | ICD-10-CM | POA: Diagnosis not present

## 2021-12-27 LAB — POCT URINE PREGNANCY: Preg Test, Ur: NEGATIVE

## 2021-12-27 MED ORDER — NICOTINE 14 MG/24HR TD PT24: 14.0000 mg | MEDICATED_PATCH | Freq: Every day | TRANSDERMAL | 1 refills | Status: DC

## 2021-12-27 MED ORDER — BUPRENORPHINE HCL-NALOXONE HCL 4-1 MG SL FILM: 1.0000 | ORAL_FILM | Freq: Two times a day (BID) | SUBLINGUAL | 0 refills | Status: DC

## 2021-12-27 NOTE — Assessment & Plan Note (Signed)
Patient is seen at [facility] for her bipolar disorder with medication management of buspirone and lithium. They have sent a request for lab results at this visit for monitoring while she is on this therapy. Plan:Will obtain lithium level, TSH, and CMP. Please fax these results to [phone].

## 2021-12-27 NOTE — Assessment & Plan Note (Signed)
Patient's dose was increased to 4-1 mg BID 1 month ago due to withdrawal symptoms midway through prior doses including sneezing, runny nose, shakiness. She reports that with her new dose, she is no longer having these symptoms. ToxAssure last obtained 10/13 with expected results. PDMP reviewed and appropriate. Plan:ToxAssure obtained today. Will refill Suboxone 4-1 mg BID. Follow-up in 3 months.

## 2021-12-27 NOTE — Progress Notes (Signed)
   CC: follow-up  HPI:  [name] is a 36 y.o. person with past medical history as detailed below who presents for routine check up of OUD in addition to follow-up for OCP and tobacco cessation therapies. Please see problem based charting for detailed assessment and plan.  Past Medical History:  Diagnosis Date   Anxiety    Bipolar affective (HCC)    Depression    Herpes simplex type II infection    Substance abuse (HCC)    Review of Systems:  Negative unless otherwise stated.  Physical Exam:  Vitals:   12/27/21 1033  BP: 121/69  Pulse: 77  Temp: 98.2 F (36.8 C)  TempSrc: Oral  SpO2: 100%  Weight: 143 lb 14.4 oz (65.3 kg)  Height: 5\' 7"  (1.702 m)   Constitutional:Patient appears well, stated age. In no acute distress. Cardio:Regular rate and rhythm. No murmurs, rubs, or gallops. Pulm:Clear to auscultation bilaterally. Normal work of breathing on room air. Abdomen:Soft, nontender, nondistended. for extremity edema. Skin:Warm and dry. Neuro:Alert and oriented x3. No focal deficit noted. Psych:Pleasant mood and affect.  Assessment & Plan:   See Encounters Tab for problem based charting.  Oral contraceptive use Patient was started on Sprintec 1 month ago. She reports using barrier protection during intercourse for the first 1.5 weeks of being on the medication. For about the last 3 weeks or so she has been experiencing breast tenderness and enlargement, nausea, and some fatigue. She is worried about pregnancy versus medication side effects. Assessment:Her symptoms are likely related to her OCP. She would like to give the therapy a few more months to see if the side effects resolve before trying a new pill, in the setting of negatvie UPT. Plan:Obtain UPT today; if positive, will plan to stop OCP. Will follow-up tolerance of current OCP at next OV.   Opioid use disorder Patient's dose was increased to 4-1 mg BID 1 month ago due to withdrawal symptoms  midway through prior doses including sneezing, runny nose, shakiness. She reports that with her new dose, she is no longer having these symptoms. ToxAssure last obtained 10/13 with expected results. PDMP reviewed and appropriate. Plan:ToxAssure obtained today. Will refill Suboxone 4-1 mg BID. Follow-up in 3 months.  Tobacco use disorder Patient was initiated on nicotine patch and gum 1 month ago however she was made aware that insurance would only cover the gum. She would prefer to start with the patch. She is willing to have our office re-prescribe the patch and cancel the prescription for the gum, and if it continues to be denied by insurance, she will have 11/13 send in a prescription for the gum.  Other long term (current) drug therapy Patient is seen at [facility] for her bipolar disorder with medication management of buspirone and lithium. They have sent a request for lab results at this visit for monitoring while she is on this therapy. Plan:Will obtain lithium level, TSH, and CMP. Please fax these results to [phone].  Patient discussed with Dr.  [phone]

## 2021-12-27 NOTE — Patient Instructions (Addendum)
Ms. Venturino,  It was a pleasure to care for you today. I'm glad you are doing well!  I have sent in refills for you. I will let you know what your lab results show. Please plan to follow up in about 3 months.  Happy holidays! Dr. August Saucer

## 2021-12-27 NOTE — Assessment & Plan Note (Signed)
Patient was started on Sprintec 1 month ago. She reports using barrier protection during intercourse for the first 1.5 weeks of being on the medication. For about the last 3 weeks or so she has been experiencing breast tenderness and enlargement, nausea, and some fatigue. She is worried about pregnancy versus medication side effects. Assessment:Her symptoms are likely related to her OCP. She would like to give the therapy a few more months to see if the side effects resolve before trying a new pill, in the setting of negatvie UPT. Plan:Obtain UPT today; if positive, will plan to stop OCP. Will follow-up tolerance of current OCP at next OV.

## 2021-12-27 NOTE — Assessment & Plan Note (Signed)
Patient was initiated on nicotine patch and gum 1 month ago however she was made aware that insurance would only cover the gum. She would prefer to start with the patch. She is willing to have our office re-prescribe the patch and cancel the prescription for the gum, and if it continues to be denied by insurance, she will have [place] send in a prescription for the gum.

## 2021-12-27 NOTE — Progress Notes (Signed)
Internal Medicine Clinic Attending ? ?Case discussed with Dr. Dean  At the time of the visit.  We reviewed the resident?s history and exam and pertinent patient test results.  I agree with the assessment, diagnosis, and plan of care documented in the resident?s note.  ?

## 2021-12-30 ENCOUNTER — Encounter: Payer: Self-pay | Admitting: *Deleted

## 2021-12-30 LAB — CMP14 + ANION GAP
ALT: 15 IU/L (ref 0–32)
AST: 19 IU/L (ref 0–40)
Albumin/Globulin Ratio: 2 (ref 1.2–2.2)
Albumin: 4.7 g/dL (ref 3.9–4.9)
Alkaline Phosphatase: 38 IU/L — ABNORMAL LOW (ref 44–121)
Anion Gap: 18 mmol/L (ref 10.0–18.0)
BUN/Creatinine Ratio: 10 (ref 9–23)
BUN: 10 mg/dL (ref 6–20)
Bilirubin Total: 0.2 mg/dL (ref 0.0–1.2)
CO2: 20 mmol/L (ref 20–29)
Calcium: 9.1 mg/dL (ref 8.7–10.2)
Chloride: 97 mmol/L (ref 96–106)
Creatinine, Ser: 0.99 mg/dL (ref 0.57–1.00)
Globulin, Total: 2.3 g/dL (ref 1.5–4.5)
Glucose: 83 mg/dL (ref 70–99)
Potassium: 4.5 mmol/L (ref 3.5–5.2)
Sodium: 135 mmol/L (ref 134–144)
Total Protein: 7 g/dL (ref 6.0–8.5)
eGFR: 76 mL/min/{1.73_m2} (ref >59)

## 2021-12-30 LAB — TSH: TSH: 2.27 u[IU]/mL (ref 0.450–4.500)

## 2021-12-30 LAB — LITHIUM LEVEL: Lithium Lvl: 0.3 mmol/L — ABNORMAL LOW (ref 0.5–1.2)

## 2021-12-30 NOTE — Progress Notes (Signed)
Final results for Lithium level collected on 12-27-2021 have been fax to [facility] per instructions on lab order.  (Order scanned to chart) Faxed to [phone] on 12-18-2-23 at 11:25 am.  Maryan Rued, [facility] 12-30-2021 11:40

## 2022-01-01 LAB — TOXASSURE SELECT,+ANTIDEPR,UR

## 2022-01-29 ENCOUNTER — Other Ambulatory Visit: Payer: Self-pay | Admitting: Student

## 2022-01-29 DIAGNOSIS — F1190 Opioid use, unspecified, uncomplicated: Secondary | ICD-10-CM

## 2022-01-29 MED ORDER — BUPRENORPHINE HCL-NALOXONE HCL 4-1 MG SL FILM: 1.0000 | ORAL_FILM | Freq: Two times a day (BID) | SUBLINGUAL | 0 refills | Status: DC

## 2022-01-29 NOTE — Progress Notes (Signed)
Received secure chat from patient stating she is out of her Suboxone, requesting refills. Called patient back who informed me that she only has 1 strip left for tomorrow but she is currently on 1 strip twice daily. She denies any relapse or cravings. She plans to call next month to schedule her 66-month follow-up in clinic.  PDMP reviewed and is appropriate, last filled 12/27/21 for 30-day supply. -Refilled Suboxone 4-1 mg, 1 film, sublingual, twice daily

## 2022-02-05 ENCOUNTER — Other Ambulatory Visit: Payer: Self-pay | Admitting: Internal Medicine

## 2022-02-05 DIAGNOSIS — F1190 Opioid use, unspecified, uncomplicated: Secondary | ICD-10-CM

## 2022-02-10 ENCOUNTER — Telehealth: Payer: Self-pay

## 2022-02-10 NOTE — Telephone Encounter (Signed)
Pt called stated that it has been 2 &1/2 wks since a PA request was to be done for her  (  BUPRENORPHINE  HCL   FILMS ) .. I have submitted it through [place]   for her and also notified her that the decision  will be mailed to her and a copy will also be sent to her pharmacy

## 2022-02-14 ENCOUNTER — Telehealth: Payer: Self-pay | Admitting: *Deleted

## 2022-02-14 NOTE — Telephone Encounter (Signed)
Call from patient unable to get Suboxone generic through Medicaid.  [facility] [place]  Pharmacist ran as brand name .  Medication is covered. On backorder should come on Monday.  Patient was called and informed of.  Patient and I were unable to locate a pharmacy that has the medication in stock.  [facility] to have shipment come in on Monday for.  Patient staes will be ok until Monday

## 2022-02-19 ENCOUNTER — Other Ambulatory Visit (HOSPITAL_COMMUNITY): Payer: Self-pay

## 2022-03-20 ENCOUNTER — Telehealth: Payer: Self-pay | Admitting: *Deleted

## 2022-03-20 MED ORDER — BUSPIRONE HCL 10 MG PO TABS: ORAL_TABLET | ORAL | 0 refills | Status: DC

## 2022-03-20 MED ORDER — BUPRENORPHINE HCL-NALOXONE HCL 4-1 MG SL FILM: 1.0000 | ORAL_FILM | Freq: Two times a day (BID) | SUBLINGUAL | 0 refills | Status: DC

## 2022-03-20 NOTE — Addendum Note (Signed)
Addended by: Renato Battles on: 03/20/2022 04:26 PM   Modules accepted: Orders

## 2022-03-20 NOTE — Telephone Encounter (Signed)
Message from patient requesting refill on her Suboxone.  Last appointment and last ToxAssure was 12/27/2021.

## 2022-04-02 ENCOUNTER — Encounter: Payer: Self-pay | Admitting: Student

## 2022-04-02 ENCOUNTER — Ambulatory Visit (INDEPENDENT_AMBULATORY_CARE_PROVIDER_SITE_OTHER): Payer: Medicaid Other | Admitting: Student

## 2022-04-02 VITALS — BP 123/89 | HR 74 | Temp 99.2°F | Wt 142.4 lb

## 2022-04-02 DIAGNOSIS — Z79899 Other long term (current) drug therapy: Secondary | ICD-10-CM

## 2022-04-02 DIAGNOSIS — F1190 Opioid use, unspecified, uncomplicated: Secondary | ICD-10-CM | POA: Diagnosis not present

## 2022-04-02 MED ORDER — BUPRENORPHINE HCL-NALOXONE HCL 4-1 MG SL FILM: 1.0000 | ORAL_FILM | Freq: Two times a day (BID) | SUBLINGUAL | 1 refills | Status: DC
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
## 2022-04-02 NOTE — Patient Instructions (Signed)
Thank you, Ms.Airyana Baeten for allowing [place] to provide your care today. Today we discussed your Suboxone therapy. I am glad that you are doing well on the current dose. I have sent refills to your local pharmacy.  Please do not hesitate to call us if you have any questions or concerns.  I have ordered the following labs for you:   Lab Orders         ToxAssure Select,+Antidepr,UR      I will call if any are abnormal. All of your labs can be accessed through "My Chart".  My Chart Access: [email or web address]  Please follow-up in 3 months  Please make sure to arrive 15 minutes prior to your next appointment. If you arrive late, you may be asked to reschedule.    We look forward to seeing you next time. Please call our clinic at [phone] if you have any questions or concerns. The best time to call is Monday-Friday from 9am-4pm, but there is someone available 24/7. If after hours or the weekend, call the main hospital number and ask for the Internal Medicine Resident On-Call. If you need medication refills, please notify your pharmacy one week in advance and they will send [place] a request.   Thank you for letting us take part in your care. Wishing you the best!  Lacinda Axon, MD 04/02/2022, 10:26 AM IM Resident, PGY-3 [facility] 41:10

## 2022-04-03 ENCOUNTER — Encounter: Payer: Self-pay | Admitting: Student
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
## 2022-04-03 NOTE — Progress Notes (Signed)
Internal Medicine Clinic Attending  Case discussed with Dr. Amponsah  At the time of the visit.  We reviewed the resident's history and exam and pertinent patient test results.  I agree with the assessment, diagnosis, and plan of care documented in the resident's note.  

## 2022-04-05 LAB — TOXASSURE SELECT,+ANTIDEPR,UR

## 2022-05-26 ENCOUNTER — Other Ambulatory Visit: Payer: Self-pay

## 2022-05-28 MED ORDER — BUPRENORPHINE HCL-NALOXONE HCL 4-1 MG SL FILM: 1.0000 | ORAL_FILM | Freq: Two times a day (BID) | SUBLINGUAL | 1 refills | Status: DC

## 2022-06-24 ENCOUNTER — Other Ambulatory Visit: Payer: Self-pay

## 2022-07-03 ENCOUNTER — Ambulatory Visit (INDEPENDENT_AMBULATORY_CARE_PROVIDER_SITE_OTHER): Payer: Medicaid Other | Admitting: Student

## 2022-07-03 ENCOUNTER — Encounter: Payer: Self-pay | Admitting: Student

## 2022-07-03 VITALS — BP 134/85 | HR 71 | Wt 140.9 lb

## 2022-07-03 DIAGNOSIS — Z79899 Other long term (current) drug therapy: Secondary | ICD-10-CM | POA: Diagnosis not present

## 2022-07-03 DIAGNOSIS — F1190 Opioid use, unspecified, uncomplicated: Secondary | ICD-10-CM

## 2022-07-03 MED ORDER — BUPRENORPHINE HCL-NALOXONE HCL 4-1 MG SL FILM: 1.0000 | ORAL_FILM | Freq: Two times a day (BID) | SUBLINGUAL | 1 refills | Status: DC

## 2022-07-03 NOTE — Progress Notes (Signed)
   CC: OUD follow-up  HPI:  MarciaMarcia West is a 36 y.o. female with PMH as below who presents to clinic to follow-up on her Suboxone therapy. Please see problem based charting for evaluation, assessment and plan.  Past Medical History:  Diagnosis Date   Anxiety    Bipolar affective (HCC)    Depression    Herpes simplex type II infection    Substance abuse (HCC)     Review of Systems:  Constitutional: Negative for fever or fatigue Eyes: Negative for visual changes Respiratory: Negative for shortness of breath Abdomen: Negative for abdominal pain, constipation or diarrhea Neuro: Negative for headache or weakness Psych: Positive for occasional stress and anxiety  Physical Exam: General: Pleasant, well-appearing young woman. No acute distress. Cardiac: RRR. No murmurs, rubs or gallops. Respiratory: Lungs CTAB. No wheezing or crackles. Skin: Warm, dry and intact without rashes or lesions Extremities: Atraumatic. Full ROM. Palpable radial and DP pulses. Neuro: A&O x 3. Moves all extremities. Normal sensation to gross touch. Psych: Appropriate mood and affect.  Vitals:   07/03/22 0950  BP: 134/85  Pulse: 71  SpO2: 99%  Weight: 140 lb 14.4 oz (63.9 kg)    Assessment & Plan:   Opioid use disorder Marcia West is here for her 66-month follow-up on her Suboxone therapy.  She is doing well on the current dose of the Suboxone. She denies any relapse or cravings. She does admit to taking a tablet of Klonopin that she has left over from a previous prescription on a few occasions due to some anxiety and stress. She follows closely with [facility] for management of her mental health disorders. I counseled her on good oral care while taking the Suboxone.  PDMP reviewed and is appropriate. Last tox screen was appropriate for buprenorphine metabolites but also benzo metabolites.  Plan: -Refilled Suboxone 4-1 mg, 1 film sublingual twice daily -Repeat toxicology screen -Follow-up in 3  months   See Encounters Tab for problem based charting.  Patient discussed with Dr. Yolande Jolly, MD, MPH

## 2022-07-03 NOTE — Patient Instructions (Addendum)
Thank you, Marcia West for allowing [place] to provide your care today. Today we discussed your Suboxone therapy.  I am glad that you are doing well.  I am sending refills to your Suboxone to your pharmacy..  I have ordered the following labs for you:  Lab Orders         ToxAssure Select,+Antidepr,UR      I will call if any are abnormal. All of your labs can be accessed through "My Chart".  My Chart Access: [email or web address]  Please follow-up in 3 months  Please make sure to arrive 15 minutes prior to your next appointment. If you arrive late, you may be asked to reschedule.    We look forward to seeing you next time. Please call our clinic at [phone] if you have any questions or concerns. The best time to call is Monday-Friday from 9am-4pm, but there is someone available 24/7. If after hours or the weekend, call the main hospital number and ask for the Internal Medicine Resident On-Call. If you need medication refills, please notify your pharmacy one week in advance and they will send [place] a request.   Thank you for letting us take part in your care. Wishing you the best!  Steffanie Rainwater, MD 07/03/2022, 10:08 AM IM Resident, PGY-3 Duwayne Heck 41:10

## 2022-07-04 ENCOUNTER — Encounter: Payer: Self-pay | Admitting: Student

## 2022-07-04 NOTE — Assessment & Plan Note (Signed)
Ms. Tarbell is here for her 59-month follow-up on her Suboxone therapy.  She is doing well on the current dose of the Suboxone. She denies any relapse or cravings. She does admit to taking a tablet of Klonopin that she has left over from a previous prescription on a few occasions due to some anxiety and stress. She follows closely with [facility] for management of her mental health disorders. I counseled her on good oral care while taking the Suboxone.  PDMP reviewed and is appropriate. Last tox screen was appropriate for buprenorphine metabolites but also benzo metabolites.  Plan: -Refilled Suboxone 4-1 mg, 1 film sublingual twice daily -Repeat toxicology screen -Follow-up in 3 months

## 2022-07-08 NOTE — Progress Notes (Signed)
Internal Medicine Clinic Attending  Case discussed with Dr. Amponsah  at the time of the visit.  We reviewed the resident's history and exam and pertinent patient test results.  I agree with the assessment, diagnosis, and plan of care documented in the resident's note.  

## 2022-07-10 LAB — TOXASSURE SELECT,+ANTIDEPR,UR

## 2022-08-06 ENCOUNTER — Other Ambulatory Visit: Payer: Self-pay

## 2022-08-06 MED ORDER — BUPRENORPHINE HCL-NALOXONE HCL 4-1 MG SL FILM: 1.0000 | ORAL_FILM | Freq: Two times a day (BID) | SUBLINGUAL | 1 refills | Status: DC

## 2022-08-06 NOTE — Telephone Encounter (Signed)
Patient called she stated she is out of medication and has been trying to get rx refilled at the pharmacy since 08/02/22, per patient the pharmacy did not tell her why she couldn't get a refill until today.

## 2022-08-07 ENCOUNTER — Telehealth: Payer: Self-pay

## 2022-08-07 ENCOUNTER — Telehealth: Payer: Self-pay | Admitting: Internal Medicine

## 2022-08-07 NOTE — Telephone Encounter (Deleted)
Pt called to f/u with the following medication.  The pt states she is unable to pick up the medication per the pahramcy    Buprenorphine HCl-Naloxone HCl (SUBOXONE) 4-1 MG FILM   [id number] - [place], [place] - [id number] (Ph: [phone])

## 2022-08-07 NOTE — Telephone Encounter (Signed)
Prior Authorization for patient (Buprenorphine Naloxone ) came through on cover my meds was submitted with last office notes awaiting approval or denial.  EXB:[id number]

## 2022-08-08 NOTE — Telephone Encounter (Signed)
*  correction: Medicaid prefers:    PHARMACY CAN FILL BRAND NAME SUBOXONE FILMS.

## 2022-08-08 NOTE — Telephone Encounter (Signed)
Pharmacy Patient Advocate Encounter  Received notification from [facility] MEDICAID that Prior Authorization for BUPRENORPHINE-NALOXONE SL FILM has been DENIED. Please advise how you'd like to proceed. Full denial letter will be uploaded to the media tab. See denial reason below.  Medicaid prefers TABLETS. Could RX be sent for tablets?

## 2022-08-11 ENCOUNTER — Other Ambulatory Visit: Payer: Self-pay | Admitting: Internal Medicine

## 2022-08-11 NOTE — Telephone Encounter (Signed)
Please see duplicate message

## 2022-08-11 NOTE — Telephone Encounter (Addendum)
Per PDMP, patient p/u this med on 08/07/22. Per [facility], Rx written 08/06/22 went through her MCD and she only paid $4.

## 2022-08-11 NOTE — Addendum Note (Signed)
Addended by: Ihor Dow on: 08/11/2022 02:26 PM   Modules accepted: Orders

## 2022-08-11 NOTE — Telephone Encounter (Signed)
Hey Dr.Dean, you could try sending in just the brand Suboxone SL film the instead of the buprenorphine-naloxone film.

## 2022-08-11 NOTE — Addendum Note (Signed)
Addended by: Fredderick Severance on: 08/11/2022 02:01 PM   Modules accepted: Orders

## 2022-08-28 ENCOUNTER — Ambulatory Visit (INDEPENDENT_AMBULATORY_CARE_PROVIDER_SITE_OTHER): Payer: MEDICAID | Admitting: Internal Medicine

## 2022-08-28 VITALS — BP 146/79 | HR 84 | Temp 98.2°F | Wt 129.3 lb

## 2022-08-28 DIAGNOSIS — F32A Depression, unspecified: Secondary | ICD-10-CM

## 2022-08-28 DIAGNOSIS — Z3041 Encounter for surveillance of contraceptive pills: Secondary | ICD-10-CM | POA: Diagnosis not present

## 2022-08-28 DIAGNOSIS — G479 Sleep disorder, unspecified: Secondary | ICD-10-CM

## 2022-08-28 DIAGNOSIS — F1190 Opioid use, unspecified, uncomplicated: Secondary | ICD-10-CM

## 2022-08-28 DIAGNOSIS — F419 Anxiety disorder, unspecified: Secondary | ICD-10-CM

## 2022-08-28 NOTE — Patient Instructions (Addendum)
Ms. Klug,  It was nice seeing you today! Thank you for choosing [facility] for your Primary Care.    I have placed a referral to you for the [facility] Psychiatry group so that they can assist me in making sure you are on the best medication for your mental health.  You are also welcome to walk in at the [facility]. They start seeing patients at 7:30 AM until 12 PM, Monday-Friday. I would get there as soon as they open so you are more likely to be seen!  I will refill your suboxone now. Please plan to follow up in 2 weeks so that I can assure that you have been able to get connected with mental health resources.  My best, Dr. August Saucer

## 2022-08-28 NOTE — Progress Notes (Signed)
CC: sleep difficulties  HPI:  [name] is a 37 y.o. female with past medical history as detailed below who presents today due to sleep difficulties. She also has questions about her mental health diagnoses and treatments. Please see problem based charting for detailed assessment and plan.  Past Medical History:  Diagnosis Date   Anxiety    Bipolar affective (HCC)    Depression    Herpes simplex type II infection    Substance abuse (HCC)    Review of Systems:  Negative unless otherwise stated.  Physical Exam:  Vitals:   08/28/22 1547  BP: (!) 146/79  Pulse: 84  Temp: 98.2 F (36.8 C)  TempSrc: Oral  SpO2: 100%  Weight: 129 lb 4.8 oz (58.7 kg)   Constitutional:Anxious seeming, appears stated age. In no acute distress. Cardio:Regular rate and rhythm. No murmurs, rubs, or gallops. Pulm:Clear to auscultation bilaterally. Normal work of breathing on room air. MWN:[id number] for extremity edema. Skin:Warm and dry. Neuro:Alert and oriented x3. No focal deficit noted. Psych:Anxious mood..   Assessment & Plan:   See Encounters Tab for problem based charting.  Oral contraceptive use Last menstrual cycle was 5-6 days ago. Patient has refills of her contraception. She explains that it is sometimes difficult to fill this at her pharmacy. I have encouraged her to request that the pharmacy contact our clinic if they have any questions about her prescription, and to continue taking it as prescribed as she does not desire pregnancy.  Anxiety and depression Patient presents a letter with her concerns regarding her mental health. She explains that she overthinks things, fears everything and nothing at the same time, and struggles to calm her anxiety on her own which affects her sleep. She feels like her family does not understand her and wants her institutionalized. She has racing thoughts, difficulty calming down, and has hyperfocus. She has not had grandiose thoughts or flight  of ideas, no spending beyond her means, and is sleeping when the opportunity arises. She says she just wants "to be a good, boring, mother!"  She recently followed with [facility] where she was treated for anxiety and depression, bipolar 1 with depressed mood, bipolar affective disorder with psychotic behavior, mania. She raises concern because she feels these diagnoses were made when she was using drugs and not an accurate reflection of her mental state otherwise. She is no longer taking lithium or buspirone, which were prescribed by [facility].  She does not wish to return to back to [facility] as she does not feel her concerns are heard but is interested in establishing with a different psychiatrist and restarting medication for her mental health. PHQ-9 21, GAD-7 18 today. No SI or HI.  Assessment:I don't feel [name] is acutely manic or a risk to herself or others. I do worry that she needs treatment for severe anxiety and depression and would benefit from more expert care given multiple prior diagnoses and need for reassessment of these diagnoses.  Plan:No medication changes made today. I have placed an urgent referral to our psychiatry group and provided her with information regarding the walk-in behavioral health clinic with [facility] and encouraged her to present there. She is in agreement with this plan.  Sleep difficulties Patient presents with concerns about her sleep and overall mental health. She explains that lately she has had trouble with her sleep, mainly with getting adequate rest when she feels unsafe. She explains that she lives with her mom and stepdad and knows that they are  overall safe people but has distrust of her family which makes sleep difficult.   She typically falls asleep in 30 minutes or less and does not trouble to stay asleep once she falls asleep. She sometimes uses melatonin for additional assistance. Assessment:I do not feel [name] has insomnia or a sleep disorder,  rather that psychosocial factors limit or rest. Plan:Continue melatonin PRN at bedtime. Address psychosocial factors as able.  Patient discussed with Dr.  Lafonda Mosses

## 2022-08-29 MED ORDER — BUPRENORPHINE HCL-NALOXONE HCL 4-1 MG SL FILM: 1.0000 | ORAL_FILM | Freq: Two times a day (BID) | SUBLINGUAL | 0 refills | Status: DC

## 2022-08-30 DIAGNOSIS — G479 Sleep disorder, unspecified: Secondary | ICD-10-CM | POA: Insufficient documentation

## 2022-08-30 NOTE — Assessment & Plan Note (Signed)
Patient presents with concerns about her sleep and overall mental health. She explains that lately she has had trouble with her sleep, mainly with getting adequate rest when she feels unsafe. She explains that she lives with her mom and stepdad and knows that they are overall safe people but has distrust of her family which makes sleep difficult.   She typically falls asleep in 30 minutes or less and does not trouble to stay asleep once she falls asleep. She sometimes uses melatonin for additional assistance. Assessment:I do not feel Marcia West has insomnia or a sleep disorder, rather that psychosocial factors limit or rest. Plan:Continue melatonin PRN at bedtime. Address psychosocial factors as able.

## 2022-08-30 NOTE — Assessment & Plan Note (Signed)
Patient presents a letter with her concerns regarding her mental health. She explains that she overthinks things, fears everything and nothing at the same time, and struggles to calm her anxiety on her own which affects her sleep. She feels like her family does not understand her and wants her institutionalized. She has racing thoughts, difficulty calming down, and has hyperfocus. She has not had grandiose thoughts or flight of ideas, no spending beyond her means, and is sleeping when the opportunity arises. She says she just wants "to be a good, boring, mother!"  She recently followed with [facility] where she was treated for anxiety and depression, bipolar 1 with depressed mood, bipolar affective disorder with psychotic behavior, mania. She raises concern because she feels these diagnoses were made when she was using drugs and not an accurate reflection of her mental state otherwise. She is no longer taking lithium or buspirone, which were prescribed by [facility].  She does not wish to return to back to [facility] as she does not feel her concerns are heard but is interested in establishing with a different psychiatrist and restarting medication for her mental health. PHQ-9 21, GAD-7 18 today. No SI or HI.  Assessment:I don't feel [name] is acutely manic or a risk to herself or others. I do worry that she needs treatment for severe anxiety and depression and would benefit from more expert care given multiple prior diagnoses and need for reassessment of these diagnoses.  Plan:No medication changes made today. I have placed an urgent referral to our psychiatry group and provided her with information regarding the walk-in behavioral health clinic with [facility] and encouraged her to present there. She is in agreement with this plan.

## 2022-08-30 NOTE — Assessment & Plan Note (Addendum)
Will send in refill of OUD today. PDMP reviewed and appropriate. Last ToxAssure 06/2022 with metabolites of diazepam which she is not prescribed. Plan to discuss this abnormality at follow up in 2 weeks.

## 2022-08-30 NOTE — Assessment & Plan Note (Signed)
Last menstrual cycle was 5-6 days ago. Patient has refills of her contraception. She explains that it is sometimes difficult to fill this at her pharmacy. I have encouraged her to request that the pharmacy contact our clinic if they have any questions about her prescription, and to continue taking it as prescribed as she does not desire pregnancy.

## 2022-09-01 ENCOUNTER — Encounter: Payer: Self-pay | Admitting: Student

## 2022-09-02 NOTE — Progress Notes (Signed)
Internal Medicine Clinic Attending  Case discussed with the resident at the time of the visit.  We reviewed the resident's history and exam and pertinent patient test results.  I agree with the assessment, diagnosis, and plan of care documented in the resident's note.  

## 2022-09-21 ENCOUNTER — Other Ambulatory Visit: Payer: Self-pay | Admitting: Student

## 2022-09-21 ENCOUNTER — Telehealth: Payer: Self-pay | Admitting: Student

## 2022-09-21 ENCOUNTER — Encounter: Payer: Self-pay | Admitting: Student

## 2022-09-21 DIAGNOSIS — F1190 Opioid use, unspecified, uncomplicated: Secondary | ICD-10-CM

## 2022-09-21 MED ORDER — BUPRENORPHINE HCL-NALOXONE HCL 4-1 MG SL FILM
1.0000 | ORAL_FILM | Freq: Two times a day (BID) | SUBLINGUAL | 0 refills | Status: DC
Start: 2022-09-21 — End: 2022-09-21

## 2022-09-21 MED ORDER — BUPRENORPHINE HCL-NALOXONE HCL 4-1 MG SL FILM
1.0000 | ORAL_FILM | Freq: Two times a day (BID) | SUBLINGUAL | 0 refills | Status: DC
Start: 2022-09-21 — End: 2022-09-22

## 2022-09-21 NOTE — Telephone Encounter (Signed)
Paged on the on call pager.  We have been having many issues with DEA numbers. Patient has been having issues getting suboxone as our DEA numbers are having issues. Will try to use my account to send this time to see if this will work. We will need to discuss this with our clinic administrator as our DEA numbers are not working properly and patient's are not able to get their medicines. Will send for patient today.

## 2022-09-21 NOTE — Progress Notes (Signed)
This person called in because of trouble refilling her Suboxone for opioid use disorder.  She saw Dr. Champ Mungo back in August.  A refill of her Suboxone was prescribed then but because of an issue at the pharmacy with a DEA number, she was unable to get those medications.  I reviewed her PDMP which corroborates her report, her last fill of Suboxone was at the end of July.  She has been stretching her Suboxone to last but is about to run out of tablets and is very anxious and upset.  I will send a refill of Suboxone to her pharmacy of choice today.  She has follow-up with Dr. Eliberto Ivory tomorrow.

## 2022-09-22 ENCOUNTER — Other Ambulatory Visit: Payer: Self-pay

## 2022-09-22 ENCOUNTER — Encounter: Payer: Self-pay | Admitting: Student

## 2022-09-22 ENCOUNTER — Ambulatory Visit (INDEPENDENT_AMBULATORY_CARE_PROVIDER_SITE_OTHER): Payer: MEDICAID | Admitting: Student

## 2022-09-22 VITALS — BP 130/84 | HR 80 | Temp 98.2°F | Ht 67.0 in | Wt 124.4 lb

## 2022-09-22 DIAGNOSIS — F1190 Opioid use, unspecified, uncomplicated: Secondary | ICD-10-CM | POA: Diagnosis not present

## 2022-09-22 MED ORDER — BUPRENORPHINE HCL-NALOXONE HCL 4-1 MG SL FILM
1.0000 | ORAL_FILM | Freq: Two times a day (BID) | SUBLINGUAL | 2 refills | Status: AC
Start: 2022-09-23 — End: 2022-09-24

## 2022-09-22 NOTE — Patient Instructions (Addendum)
Thank you so much for coming to the clinic today!   We have sent in a refill for your suboxone starting tomorrow. It will be a month supply, followed by 2 refills.  If you have any trouble with this please call the clinic and someone will be able to help you ASAP.  I will also check on the status of the psych referral as well.  If you have any questions please feel free to the call the clinic at anytime at [phone]. It was a pleasure seeing you!  Best, Dr. Thomasene Ripple

## 2022-09-23 NOTE — Progress Notes (Signed)
CC: OUD follow-up  HPI:  [name] is a 37 y.o. female living with a history stated below and presents today for OUD follow-up. Please see problem based assessment and plan for additional details.  Past Medical History:  Diagnosis Date   Anxiety    Bipolar affective (HCC)    Depression    Herpes simplex type II infection    Substance abuse (HCC)     Current Outpatient Medications on File Prior to Visit  Medication Sig Dispense Refill   norgestimate-ethinyl estradiol (SPRINTEC 28) 0.25-35 MG-MCG tablet Take 1 tablet by mouth daily. 30 tablet 11   No current facility-administered medications on file prior to visit.    Family History  Problem Relation Age of Onset   Arthritis Maternal Grandmother    Diabetes Maternal Grandmother    Diabetes Maternal Grandfather    Obesity Paternal Grandmother     Social History   Socioeconomic History   Marital status: Single    Spouse name: Not on file   Number of children: Not on file   Years of education: Not on file   Highest education level: Not on file  Occupational History   Not on file  Tobacco Use   Smoking status: Every Day    Current packs/day: 0.25    Types: Cigarettes   Smokeless tobacco: Never   Tobacco comments:    4 cigs per day  Vaping Use   Vaping status: Never Used  Substance and Sexual Activity   Alcohol use: Not Currently   Drug use: Not Currently    Types: Methamphetamines, Other-see comments, Marijuana, "Crack" cocaine    Comment: suboxone daily   Sexual activity: Not Currently    Birth control/protection: None  Other Topics Concern   Not on file  Social History Narrative   Not on file   Social Determinants of Health   Financial Resource Strain: Not on file  Food Insecurity: No Food Insecurity (12/27/2021)   Hunger Vital Sign    Worried About Running Out of Food in the Last Year: Never true    Ran Out of Food in the Last Year: Never true  Transportation Needs: No Transportation Needs  (12/27/2021)   PRAPARE - Administrator, Civil Service (Medical): No    Lack of Transportation (Non-Medical): No  Recent Concern: Transportation Needs - Unmet Transportation Needs (11/29/2021)   PRAPARE - Administrator, Civil Service (Medical): Yes    Lack of Transportation (Non-Medical): Yes  Physical Activity: Not on file  Stress: Not on file  Social Connections: Moderately Isolated (11/29/2021)   Social Connection and Isolation Panel [NHANES]    Frequency of Communication with Friends and Family: More than three times a week    Frequency of Social Gatherings with Friends and Family: More than three times a week    Attends Religious Services: More than 4 times per year    Active Member of [employer] or Organizations: No    Attends Banker Meetings: Never    Marital Status: Never married  Intimate Partner Violence: Not At Risk (12/27/2021)   Humiliation, Afraid, Rape, and Kick questionnaire    Fear of Current or Ex-Partner: No    Emotionally Abused: No    Physically Abused: No    Sexually Abused: No    Review of Systems: ROS negative except for what is noted on the assessment and plan.  Vitals:   09/22/22 1021  BP: 130/84  Pulse: 80  Temp: 98.2 F (  36.8 C)  TempSrc: Oral  SpO2: 100%  Weight: 124 lb 6.4 oz (56.4 kg)  Height: 5\' 7"  (1.702 m)    Physical Exam: Constitutional: well-appearing female in no acute distress Cardiovascular: regular rate and rhythm, no m/r/g Pulmonary/Chest: normal work of breathing on room air, lungs clear to auscultation bilaterally Abdominal: soft, non-tender, non-distended MSK: normal bulk and tone, no track marks seen  Assessment & Plan:   Opioid use disorder Patient presents for follow-up for opioid use disorder.  She has currently been out of her Suboxone for the last 4 to 5 days and has attempted refills have not been successful.  PDMP is appropriate and corroborates this.  Will refill today, with  attending as provider.  For future reference, if resident DEA number does not go through, please reach out to attending for refill of prescription.  Plan: - Refill Suboxone 4-1 mg films twice a day - Check tox assure today  Patient discussed with Dr. Rockey Situ, M.D. [facility] Internal Medicine, PGY-2 Pager: [phone] Date 09/23/2022 Time 7:41 AM

## 2022-09-23 NOTE — Progress Notes (Signed)
Internal Medicine Clinic Attending  Case discussed with the resident at the time of the visit.  We reviewed the resident's history and exam and pertinent patient test results.  I agree with the assessment, diagnosis, and plan of care documented in the resident's note.  

## 2022-09-23 NOTE — Assessment & Plan Note (Signed)
Patient presents for follow-up for opioid use disorder.  She has currently been out of her Suboxone for the last 4 to 5 days and has attempted refills have not been successful.  PDMP is appropriate and corroborates this.  Will refill today, with attending as provider.  For future reference, if resident DEA number does not go through, please reach out to attending for refill of prescription.  Plan: - Refill Suboxone 4-1 mg films twice a day - Check tox assure today

## 2022-09-24 LAB — TOXASSURE SELECT,+ANTIDEPR,UR

## 2022-10-20 ENCOUNTER — Encounter: Payer: Self-pay | Admitting: Student

## 2022-10-20 ENCOUNTER — Other Ambulatory Visit: Payer: Self-pay | Admitting: Internal Medicine

## 2022-10-20 DIAGNOSIS — Z3009 Encounter for other general counseling and advice on contraception: Secondary | ICD-10-CM

## 2022-10-20 MED ORDER — BUPRENORPHINE HCL-NALOXONE HCL 4-1 MG SL FILM: 1.0000 | ORAL_FILM | Freq: Two times a day (BID) | SUBLINGUAL | 0 refills | Status: DC

## 2022-10-27 MED ORDER — NORGESTIMATE-ETH ESTRADIOL 0.25-35 MG-MCG PO TABS
1.0000 | ORAL_TABLET | Freq: Every day | ORAL | 11 refills | Status: DC
Start: 2022-10-27 — End: 2022-12-22

## 2022-10-27 MED ORDER — BUPRENORPHINE HCL-NALOXONE HCL 4-1 MG SL FILM: 1.0000 | ORAL_FILM | Freq: Two times a day (BID) | SUBLINGUAL | 0 refills | Status: DC

## 2022-11-08 IMAGING — CR DG NECK SOFT TISSUE
1 series · 2 of 2 positions shown · non-contrast
Comparison: None.

CLINICAL DATA: Inhaled piece of metal

EXAM:
NECK SOFT TISSUES - 1+ VIEW

[Series 1: dg neck soft tissue · 0.14mm/px · 2 of 2 slices shown]
[im 1/2]
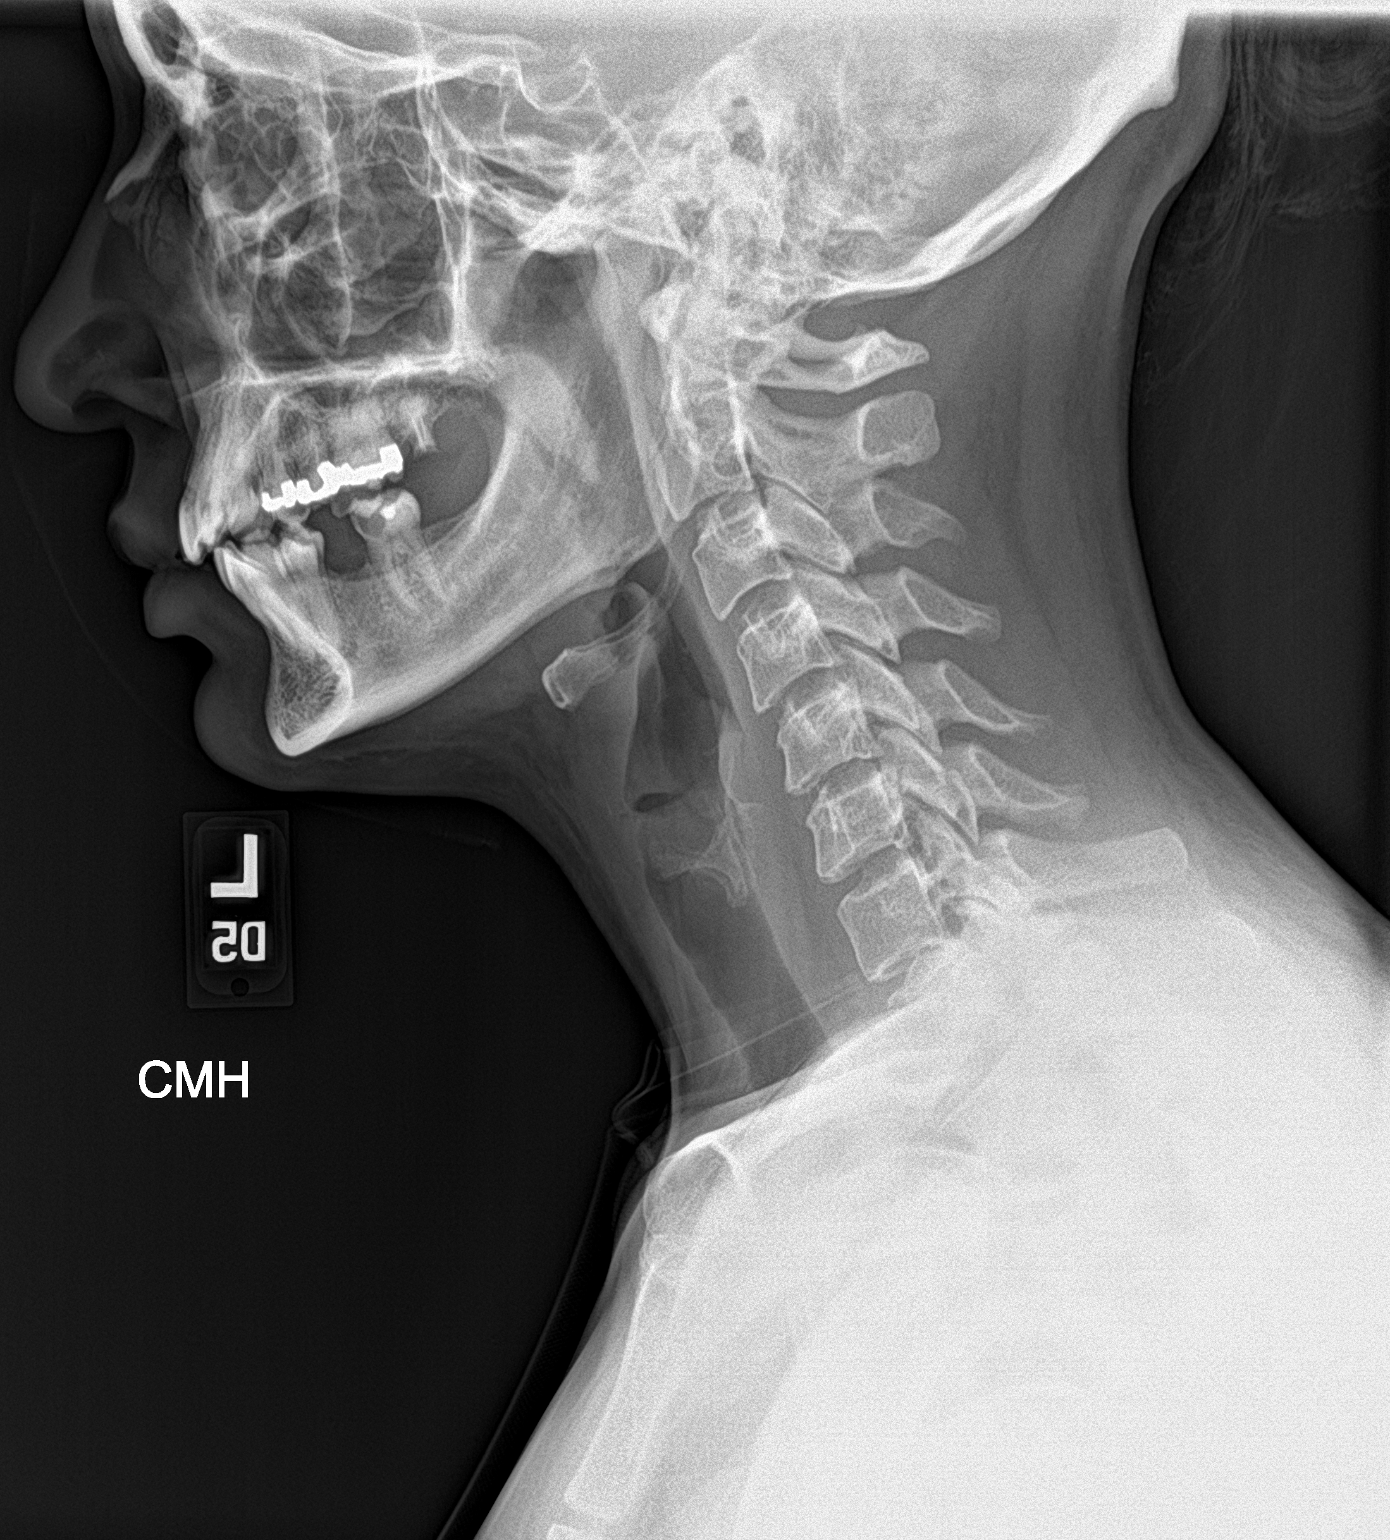
[im 2/2]
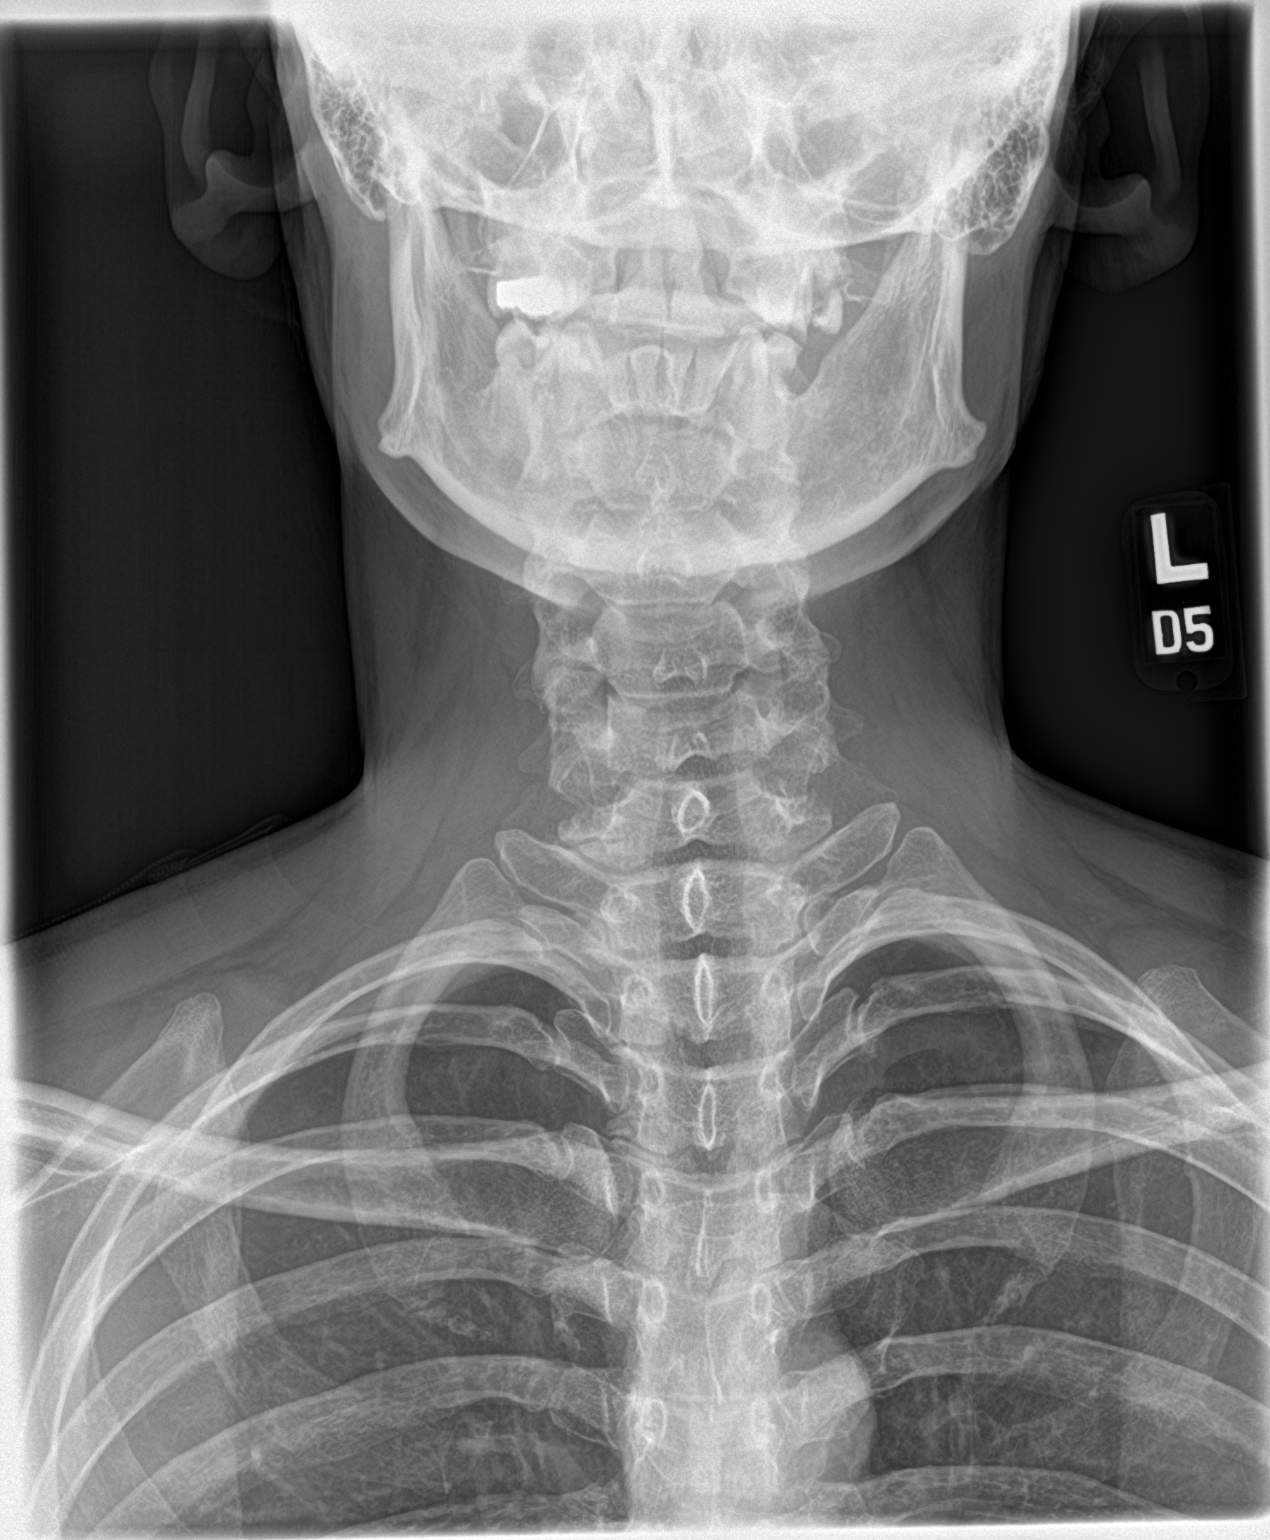

[2 of 2 positions shown; findings below may reference images not displayed]

FINDINGS: There is no evidence of retropharyngeal soft tissue swelling or
epiglottic enlargement. The cervical airway is unremarkable and no
radio-opaque foreign body identified.
IMPRESSION: No unexpected radiopaque foreign body visualized.

## 2022-11-13 ENCOUNTER — Ambulatory Visit (INDEPENDENT_AMBULATORY_CARE_PROVIDER_SITE_OTHER): Payer: MEDICAID | Admitting: Psychiatry

## 2022-11-13 ENCOUNTER — Encounter (HOSPITAL_COMMUNITY): Payer: Self-pay | Admitting: Psychiatry

## 2022-11-13 VITALS — BP 113/76 | HR 79 | Ht 64.0 in | Wt 126.0 lb

## 2022-11-13 DIAGNOSIS — F17200 Nicotine dependence, unspecified, uncomplicated: Secondary | ICD-10-CM

## 2022-11-13 DIAGNOSIS — F411 Generalized anxiety disorder: Secondary | ICD-10-CM | POA: Diagnosis not present

## 2022-11-13 DIAGNOSIS — F1190 Opioid use, unspecified, uncomplicated: Secondary | ICD-10-CM

## 2022-11-13 DIAGNOSIS — F315 Bipolar disorder, current episode depressed, severe, with psychotic features: Secondary | ICD-10-CM | POA: Diagnosis not present

## 2022-11-13 DIAGNOSIS — F5102 Adjustment insomnia: Secondary | ICD-10-CM

## 2022-11-13 MED ORDER — BUSPIRONE HCL 10 MG PO TABS: 10.0000 mg | ORAL_TABLET | Freq: Every day | ORAL | 0 refills | Status: DC

## 2022-11-13 MED ORDER — TRAZODONE HCL 50 MG PO TABS: 50.0000 mg | ORAL_TABLET | Freq: Every day | ORAL | 0 refills | Status: DC

## 2022-11-13 MED ORDER — OLANZAPINE 10 MG PO TBDP: 10.0000 mg | ORAL_TABLET | Freq: Every day | ORAL | 0 refills | Status: DC

## 2022-11-13 NOTE — Progress Notes (Signed)
Psychiatric Initial Adult Assessment   Patient Identification: Marcia West MRN:  [id number] Date of Evaluation:  11/13/2022 Referral Source: primary care Chief Complaint:   Chief Complaint  Patient presents with   New Patient (Initial Visit)   Establish Care   Visit Diagnosis:    ICD-10-CM   1. Bipolar affective disorder, depressed, severe, with psychotic behavior (HCC)  F31.5     2. Tobacco use disorder  F17.200     3. Opioid use disorder  F11.90     4. GAD (generalized anxiety disorder)  F41.1     5. Adjustment insomnia  F51.02       History of Present Illness: Patient is a 37 years old currently single Caucasian female was living with her mom and her husband she currently works full-time in [employer].  She has 3 kids age 74, 82, 15 of the 54 years old daughter lives with her.  Referred by primary care physician establish care for history of bipolar disorder insomnia and drug use She wants to change provider and has been following with [facility] but not feel that she was being heard and has been non compliant with her meds for months now.   Patient gives a complicated a long history of having episodes of depression and also of possible mania with episodes of depression being withdrawn a motivation decreased energy poor sleep and hopelessness and also hospital admission couple of times because of depression and unable to meet with the stressors at that time.  She also gives history of mania like episodes including increased energy talkativeness she wants to talk with people she wants to keep her self busy mind is racing she will do risky behavior and also associated with paranoia as if people are talking or watching her.  It is to note that this Chrismon last for a day or more and also there is long history of using drugs in the past.  Patient has been using cocaine methamphetamine and alcohol and other substances in the past she started using Percocet when she had a knee injury  when she was younger after which she started using Percocet and got dependent on it.  She also added methamphetamine cocaine to her use now sober since 2020 when the doctor mentioned to her that there is high risk of death with opioid use and overdose she started going to the methadone clinic and now she is on Suboxone clinic she is on Suboxone which she gets a refill every 2 or 3 months now she remains sober no craving and still attends supportive to keep working on her sobriety  She still uses marijuana states sporadically but she does understand she needs to let that go as well considering she wants to get help and the substance use.  Nicotine use is 6 to 7 cigarettes a day  She does endorse anxiety symptoms most in the past panic-like attacks and also excessive worries and unreasonable worries diagnosis generalized anxiety she has been taking BuSpar but has not been on current medication  She has been admitted couple times in the past last time was 1-1/2-year ago because of added stressors which may be related to dealing with the kids dad or relevant relationship concerns with hopelessness  She also has history of noncompliance leading to poor sleep with increased anxiety agitation and restlessness leading to hospital admission for possible bipolar depression or mania  She wants to be compliant recently she has been taking lithium from her doctor in [facility] but feels  that she was not connecting with her doctor and does not want to get the blood work done and lithium was making her tired  Aggravating factors; past concerns in relationship difficult childhood and also substance use  Modifying factors; her current living situation with her mom, she goes for a walk, her children  Duration since young age her age 59 when she felt she has her first panic attack or anxiety symptoms  Severity in general she feels her mood is not hopeless or depressed on a day-to-day basis but she worries about  recurrence of episode triggered by poor sleep or stress if it may happen with substance use      Past Psychiatric History: bipolar, gad, opioid use disorder  Previous Psychotropic Medications: Yes  Lithium but doesn't want to do blood work Substance Abuse History in the last 12 months:  Yes.    Consequences of Substance Abuse: On suboxone for maintenance   Past Medical History:  Past Medical History:  Diagnosis Date   Anxiety    Bipolar affective (HCC)    Depression    Herpes simplex type II infection    Substance abuse (HCC)     Past Surgical History:  Procedure Laterality Date   NO PAST SURGERIES     WISDOM TOOTH EXTRACTION      Family Psychiatric History: not know  Family History:  Family History  Problem Relation Age of Onset   Arthritis Maternal Grandmother    Diabetes Maternal Grandmother    Diabetes Maternal Grandfather    Obesity Paternal Grandmother     Social History:   Social History   Socioeconomic History   Marital status: Single    Spouse name: Not on file   Number of children: Not on file   Years of education: Not on file   Highest education level: Not on file  Occupational History   Not on file  Tobacco Use   Smoking status: Every Day    Current packs/day: 0.25    Types: Cigarettes   Smokeless tobacco: Never   Tobacco comments:    4 cigs per day  Vaping Use   Vaping status: Never Used  Substance and Sexual Activity   Alcohol use: Not Currently   Drug use: Not Currently    Types: Methamphetamines, Other-see comments, Marijuana, "Crack" cocaine    Comment: suboxone daily   Sexual activity: Not Currently    Birth control/protection: None  Other Topics Concern   Not on file  Social History Narrative   Not on file   Social Determinants of Health   Financial Resource Strain: Not on file  Food Insecurity: No Food Insecurity (12/27/2021)   Hunger Vital Sign    Worried About Running Out of Food in the Last Year: Never true    Ran  Out of Food in the Last Year: Never true  Transportation Needs: No Transportation Needs (12/27/2021)   PRAPARE - Administrator, Civil Service (Medical): No    Lack of Transportation (Non-Medical): No  Recent Concern: Transportation Needs - Unmet Transportation Needs (11/29/2021)   PRAPARE - Administrator, Civil Service (Medical): Yes    Lack of Transportation (Non-Medical): Yes  Physical Activity: Not on file  Stress: Not on file  Social Connections: Moderately Isolated (11/29/2021)   Social Connection and Isolation Panel [NHANES]    Frequency of Communication with Friends and Family: More than three times a week    Frequency of Social Gatherings with Friends and Family:  More than three times a week    Attends Religious Services: More than 4 times per year    Active Member of Clubs or Organizations: No    Attends Banker Meetings: Never    Marital Status: Never married    Additional Social History: grew up with parents , a lots of fights amongst parents. Challenges but no physical or sexual abuse  Allergies:  No Known Allergies  Metabolic Disorder Labs: Lab Results  Component Value Date   HGBA1C 4.9 09/16/2021   MPG 93.93 09/16/2021   Lab Results  Component Value Date   PROLACTIN 8.2 12/25/2020   Lab Results  Component Value Date   CHOL 109 09/16/2021   TRIG 41 09/16/2021   HDL 46 09/16/2021   CHOLHDL 2.4 09/16/2021   VLDL 8 09/16/2021   LDLCALC 55 09/16/2021   Lab Results  Component Value Date   TSH 2.270 12/27/2021    Therapeutic Level Labs: Lab Results  Component Value Date   LITHIUM 0.3 (L) 12/27/2021   No results found for: "CBMZ" No results found for: "VALPROATE"  Current Medications: Current Outpatient Medications  Medication Sig Dispense Refill   Buprenorphine HCl-Naloxone HCl (SUBOXONE) 4-1 MG FILM Place 1 Film under the tongue 2 (two) times daily. 30 each 0   busPIRone (BUSPAR) 10 MG tablet Take 1 tablet (10  mg total) by mouth daily. 30 tablet 0   norgestimate-ethinyl estradiol (SPRINTEC 28) 0.25-35 MG-MCG tablet Take 1 tablet by mouth daily. 30 tablet 11   OLANZapine zydis (ZYPREXA) 10 MG disintegrating tablet Take 1 tablet (10 mg total) by mouth at bedtime. 30 tablet 0   traZODone (DESYREL) 50 MG tablet Take 1 tablet (50 mg total) by mouth at bedtime. 30 tablet 0   No current facility-administered medications for this visit.    Psychiatric Specialty Exam: Review of Systems  Cardiovascular:  Negative for chest pain.  Neurological:  Negative for tremors.  Psychiatric/Behavioral:  Positive for sleep disturbance.     Blood pressure 113/76, pulse 79, height 5\' 4"  (1.626 m), weight 126 lb (57.2 kg).Body mass index is 21.63 kg/m.  General Appearance: Casual  Eye Contact:  Fair  Speech:  Clear and Coherent  Volume:  Normal  Mood:  Euthymic  Affect:  Constricted  Thought Process:  Goal Directed  Orientation:  Full (Time, Place, and Person)  Thought Content:  Rumination  Suicidal Thoughts:  No  Homicidal Thoughts:  No  Memory:  Immediate;   Fair  Judgement:  Fair  Insight:  Shallow  Psychomotor Activity:  Normal  Concentration:  Concentration: Fair  Recall:  Fair  Fund of Knowledge:Good  Language: Good  Akathisia:  No  Handed:    AIMS (if indicated):  no involuntary movements  Assets:  Desire for Improvement Social Support  ADL's:  Intact  Cognition: WNL  Sleep:  Poor   Screenings: AIMS    Flowsheet Row Admission (Discharged) from 01/19/2020 in [facility] INPATIENT BEHAVIORAL MEDICINE Admission (Discharged) from 11/23/2019 in [facility] INPATIENT BEHAVIORAL MEDICINE Admission (Discharged) from 10/15/2019 in [facility] INPATIENT BEHAVIORAL MEDICINE  AIMS Total Score 0 0 0      AUDIT    Flowsheet Row Admission (Discharged) from 01/19/2020 in [facility] INPATIENT BEHAVIORAL MEDICINE Admission (Discharged) from 11/23/2019 in [facility] INPATIENT BEHAVIORAL MEDICINE Admission (Discharged) from 10/15/2019 in [facility]  INPATIENT BEHAVIORAL MEDICINE  Alcohol Use Disorder Identification Test Final Score (AUDIT) 27 25 3       GAD-7    Flowsheet Row Office Visit from 04/02/2022 in  [facility] Office Visit from 12/25/2020 in [facility] Office Visit from 11/01/2019 in [facility] Office Visit from 09/13/2019 in [facility] Office Visit from 02/15/2019 in [facility]  Total GAD-7 Score 10 14 17 21 15       PHQ2-9    Flowsheet Row Office Visit from 11/13/2022 in [facility] Office Visit from 09/22/2022 in [facility] Office Visit from 08/28/2022 in [facility] Office Visit from 04/02/2022 in [facility] Office Visit from 12/27/2021 in [facility]  PHQ-2 Total Score 1 3 3 2  0  PHQ-9 Total Score -- 15 24 7 1       Flowsheet Row Office Visit from 11/13/2022 in [facility] from 09/16/2021 in [facility] Admission (Discharged) from 01/19/2020 in [facility] INPATIENT BEHAVIORAL MEDICINE  C-SSRS RISK CATEGORY No Risk No Risk High Risk       Assessment and Plan: as follows  Bipolar disorder per history current episode depressed; she has benefited from olanzapine in the past we will restart a dose of 10 mg she was taking sublingually from her inpatient admission it helped her sleep and also help her appetite since she has been losing weight and also has a decreased appetite sublingual Zyprexa would be a good choice to start  Generalized anxiety disorder she has been noncompliant with her BuSpar but it has helped her anxiety could reinstate BuSpar 10 mg 1 or more times a day if needed recommend therapy to work on her coping skills and distraction from negative  thoughts  Insomnia reviewed sleep hygiene start olanzapine and trazodone as insomnia is a big trigger for her recurrence of mania or depression she can take half a tablet of trazodone half of Zyprexa for the first 2 nights then in case she is able to tolerated we can go to a 10 mg of Zyprexa and 50 mg of trazodone  Substance use disorder she is well-maintained on Suboxone she will follow-up with the Suboxone clinic.  Discussed abstain from other drugs including marijuana and the last mention against alcohol she is still attending AA encouraged it continues to help with her sobriety relapse prevention discussed  Follow-up in 3 to 4 weeks or earlier if needed recommend therapy to be scheduled as well. Questions addressed   Direct care time spent 60 min including chart review and documentation  Collaboration of Care: Primary Care Provider AEB notes and chart/referral reviewed  Patient/Guardian was advised Release of Information must be obtained prior to any record release in order to collaborate their care with an outside provider. Patient/Guardian was advised if they have not already done so to contact the registration department to sign all necessary forms in order for [place] to release information regarding their care.   Consent: Patient/Guardian gives verbal consent for treatment and assignment of benefits for services provided during this visit. Patient/Guardian expressed understanding and agreed to proceed.   Thresa Ross, MD 10/31/202411:27 AM

## 2022-11-17 ENCOUNTER — Encounter: Payer: Self-pay | Admitting: Student

## 2022-11-17 ENCOUNTER — Other Ambulatory Visit: Payer: Self-pay

## 2022-11-17 MED ORDER — BUPRENORPHINE HCL-NALOXONE HCL 4-1 MG SL FILM: 1.0000 | ORAL_FILM | Freq: Two times a day (BID) | SUBLINGUAL | 0 refills | Status: DC

## 2022-12-10 ENCOUNTER — Other Ambulatory Visit (HOSPITAL_COMMUNITY): Payer: Self-pay | Admitting: Psychiatry

## 2022-12-15 ENCOUNTER — Other Ambulatory Visit: Payer: Self-pay

## 2022-12-16 ENCOUNTER — Encounter (HOSPITAL_COMMUNITY): Payer: Self-pay | Admitting: Psychiatry

## 2022-12-16 ENCOUNTER — Ambulatory Visit (INDEPENDENT_AMBULATORY_CARE_PROVIDER_SITE_OTHER): Payer: MEDICAID | Admitting: Psychiatry

## 2022-12-16 VITALS — BP 118/74 | Ht 64.0 in | Wt 126.0 lb

## 2022-12-16 DIAGNOSIS — F17200 Nicotine dependence, unspecified, uncomplicated: Secondary | ICD-10-CM | POA: Diagnosis not present

## 2022-12-16 DIAGNOSIS — F315 Bipolar disorder, current episode depressed, severe, with psychotic features: Secondary | ICD-10-CM

## 2022-12-16 DIAGNOSIS — F5102 Adjustment insomnia: Secondary | ICD-10-CM

## 2022-12-16 DIAGNOSIS — F1190 Opioid use, unspecified, uncomplicated: Secondary | ICD-10-CM | POA: Diagnosis not present

## 2022-12-16 DIAGNOSIS — F411 Generalized anxiety disorder: Secondary | ICD-10-CM

## 2022-12-16 MED ORDER — OLANZAPINE 15 MG PO TBDP: 15.0000 mg | ORAL_TABLET | Freq: Every day | ORAL | 0 refills | Status: DC

## 2022-12-16 MED ORDER — NICOTINE 14 MG/24HR TD PT24: 14.0000 mg | MEDICATED_PATCH | Freq: Every day | TRANSDERMAL | 0 refills | Status: DC

## 2022-12-16 NOTE — Progress Notes (Signed)
[facility] Follow up visit   Patient Identification: Shary Feng MRN:  [id number] Date of Evaluation:  12/16/2022 Referral Source: primary care Chief Complaint:   Chief Complaint  Patient presents with   Follow-up   Visit Diagnosis:    ICD-10-CM   1. Bipolar affective disorder, depressed, severe, with psychotic behavior (HCC)  F31.5     2. Tobacco use disorder  F17.200     3. Opioid use disorder  F11.90     4. GAD (generalized anxiety disorder)  F41.1     5. Adjustment insomnia  F51.02       History of Present Illness: Patient is a 37 years old currently single Caucasian female was living with her mom and her husband she currently works full-time in [employer].  She has 3 kids age 36, 67, 35 of the 76 years old daughter lives with her.  Referred initially by primary care physician establish care for history of bipolar disorder insomnia and drug use She wanted to change provider and has been following with [facility] but not feel that she was being heard and has been non compliant with her meds for months now.   Patient gives a complicated a long history of having episodes of depression and also of possible mania with episodes of depression  Last visit restarted olanzapine, it has helped sleep apetite,  mood is somewhat better  Buspar helped anxiety but seldom taking   Stress related to her sons , one of which she has difficulty to see  No craving for opiods since on suboxone  Denies craving for methamphetamine, or cocaine either  Want nicotine patch   States have stopped using THC  Nicotine use is 6 to 7 cigarettes a day  Not on lithium any more, compliance was discussed last visit has helped but still feel somewhat subdued, not manic  Aggravating factors;past relationship concerns, difficult childhood and also substance use  Modifying factors; her current living situation with her mom, walking, her children  Duration since age 37    Severity in general better but still  somewhat subdued      Past Psychiatric History: bipolar, gad, opioid use disorder  Previous Psychotropic Medications: Yes  Lithium but doesn't want to do blood work Substance Abuse History in the last 12 months:  Yes.    Consequences of Substance Abuse: On suboxone for maintenance   Past Medical History:  Past Medical History:  Diagnosis Date   Anxiety    Bipolar affective (HCC)    Depression    Herpes simplex type II infection    Substance abuse (HCC)     Past Surgical History:  Procedure Laterality Date   NO PAST SURGERIES     WISDOM TOOTH EXTRACTION      Family Psychiatric History: not know  Family History:  Family History  Problem Relation Age of Onset   Arthritis Maternal Grandmother    Diabetes Maternal Grandmother    Diabetes Maternal Grandfather    Obesity Paternal Grandmother     Social History:   Social History   Socioeconomic History   Marital status: Single    Spouse name: Not on file   Number of children: Not on file   Years of education: Not on file   Highest education level: Not on file  Occupational History   Not on file  Tobacco Use   Smoking status: Every Day    Current packs/day: 0.25    Types: Cigarettes   Smokeless tobacco: Never   Tobacco comments:  4 cigs per day  Vaping Use   Vaping status: Never Used  Substance and Sexual Activity   Alcohol use: Not Currently   Drug use: Not Currently    Types: Methamphetamines, Other-see comments, Marijuana, "Crack" cocaine    Comment: suboxone daily   Sexual activity: Not Currently    Birth control/protection: None  Other Topics Concern   Not on file  Social History Narrative   Not on file   Social Determinants of Health   Financial Resource Strain: Not on file  Food Insecurity: No Food Insecurity (12/27/2021)   Hunger Vital Sign    Worried About Running Out of Food in the Last Year: Never true    Ran Out of Food in the Last Year: Never true  Transportation Needs: No  Transportation Needs (12/27/2021)   PRAPARE - Administrator, Civil Service (Medical): No    Lack of Transportation (Non-Medical): No  Recent Concern: Transportation Needs - Unmet Transportation Needs (11/29/2021)   PRAPARE - Administrator, Civil Service (Medical): Yes    Lack of Transportation (Non-Medical): Yes  Physical Activity: Not on file  Stress: Not on file  Social Connections: Moderately Isolated (11/29/2021)   Social Connection and Isolation Panel [NHANES]    Frequency of Communication with Friends and Family: More than three times a week    Frequency of Social Gatherings with Friends and Family: More than three times a week    Attends Religious Services: More than 4 times per year    Active Member of [employer] or Organizations: No    Attends Engineer, structural: Never    Marital Status: Never married    Allergies:  No Known Allergies  Metabolic Disorder Labs: Lab Results  Component Value Date   HGBA1C 4.9 09/16/2021   MPG 93.93 09/16/2021   Lab Results  Component Value Date   PROLACTIN 8.2 12/25/2020   Lab Results  Component Value Date   CHOL 109 09/16/2021   TRIG 41 09/16/2021   HDL 46 09/16/2021   CHOLHDL 2.4 09/16/2021   VLDL 8 09/16/2021   LDLCALC 55 09/16/2021   Lab Results  Component Value Date   TSH 2.270 12/27/2021    Therapeutic Level Labs: Lab Results  Component Value Date   LITHIUM 0.3 (L) 12/27/2021   No results found for: "CBMZ" No results found for: "VALPROATE"  Current Medications: Current Outpatient Medications  Medication Sig Dispense Refill   Buprenorphine HCl-Naloxone HCl (SUBOXONE) 4-1 MG FILM Place 1 Film under the tongue 2 (two) times daily. 60 each 0   nicotine (NICODERM CQ - DOSED IN MG/24 HOURS) 14 mg/24hr patch Place 1 patch (14 mg total) onto the skin daily. 28 patch 0   norgestimate-ethinyl estradiol (SPRINTEC 28) 0.25-35 MG-MCG tablet Take 1 tablet by mouth daily. 30 tablet 11   OLANZapine  zydis (ZYPREXA) 15 MG disintegrating tablet Take 1 tablet (15 mg total) by mouth at bedtime. 30 tablet 0   No current facility-administered medications for this visit.    Psychiatric Specialty Exam: Review of Systems  Cardiovascular:  Negative for chest pain.  Neurological:  Negative for tremors.  Psychiatric/Behavioral:  Negative for sleep disturbance.     Blood pressure 118/74, height 5\' 4"  (1.626 m), weight 126 lb (57.2 kg).Body mass index is 21.63 kg/m.  General Appearance: Casual  Eye Contact:  Fair  Speech:  Clear and Coherent  Volume:  Normal  Mood: fair  Affect:  Constricted  Thought Process:  Goal Directed  Orientation:  Full (Time, Place, and Person)  Thought Content:  Rumination  Suicidal Thoughts:  No  Homicidal Thoughts:  No  Memory:  Immediate;   Fair  Judgement:  Fair  Insight:  Shallow  Psychomotor Activity:  Normal  Concentration:  Concentration: Fair  Recall:  Fair  Fund of Knowledge:Good  Language: Good  Akathisia:  No  Handed:    AIMS (if indicated):  no involuntary movements  Assets:  Desire for Improvement Social Support  ADL's:  Intact  Cognition: WNL  Sleep:  Poor   Screenings: AIMS    Flowsheet Row Admission (Discharged) from 01/19/2020 in [facility] INPATIENT BEH[facility]AL MEDICINE Admission (Discharged) from 11/23/2019 in [facility] INPATIENT BEHAVIORAL MEDICINE Admission (Discharged) from 10/15/2019 in [facility] INPATIENT BEHAV[facility]AL MEDICINE  AIMS Total Score 0 0 0      AUDIT    Flowsheet Row Admission (Discharged) from 01/19/2020 in [facility] INPATIENT BEHAVIORAL MEDICINE Admission (Discharged) from 11/23/2019 in [facility] INPATIENT BEHAVIORAL MEDICINE Admission (Discharged) from 10/15/2019 in [facility] INPATIENT BEHAVIORAL MEDICINE  Alcohol Use Disorder Identification Test Final Score (AUDIT) 27 25 3       GAD-7    Flowsheet Row Office Visit from 11/13/2022 in [facility] at [facility] Office Visit from 04/02/2022 in [facility] - A Dept Of [place]. [facility] Office Visit from 12/25/2020 in [facility] Med [facility]tr - A Dept Of [place]. [facility] Office Visit from 11/01/2019 in [facility] Internal Med Ctr - A Dept Of [place]. [facility] Office Visit from 09/13/2019 in [facility] Internal Med [facility]tr - A Dept Of [place]. [facility]  Total GAD-7 Score 20 10 14 17 21       PHQ2-9    Flowsheet Row Office Visit from 11/13/2022 in [facility] at [facility] Office Visit from 09/22/2022 in [facility] Internal Med Ctr - A Dept Of [place]. [facility] Office Visit from 08/28/2022 in [facility] Internal Med Ctr - A Dept Of [place]. [facility] Office Visit from 04/02/2022 in [facility] Internal Med Ctr - A Dept Of [place]. [facility] Office Visit from 12/27/2021 in [facility] Med [facility]tr - A Dept Of [place]. [facility]  PHQ-2 Total Score 3 3 3 2  0  PHQ-9 Total Score 20 15 24 7 1       Flowsheet Row Office Visit from 11/13/2022 in [facility] at [facility] ED from 09/16/2021 in [facility] Admission (Discharged) from 01/19/2020 in [facility] INPATIENT BEHAVIORAL MEDICINE  C-SSRS RISK CATEGORY No Risk No Risk High Risk       Assessment and Plan: as follows  Prior documentation reviewed  Bipolar disorder per history current episode depressed; she has benefited from olanzapine , still somewhat subdued, will increase to 15mg   Denies tremors  Generalized anxiety disorder ; improving continue buspar but says does not need it for now  Is scheduled for therapy   Insomnia  reviewed sleep hygiene, is improving, increase olanzapine to 15mg   Is not taking trazadone or does not need, we can discontinue   Substance use disorder she is well-maintained on Suboxone she will follow-up with the Suboxone clinic.  Discussed to  abstain from [facility]  Wants a nicotine patch will send for nicotine dependence, has reduced amount to 7 or less.  Can call to reduce dose of patch in 2 weeks or  earlier if needed  Fu 2 - 46m.   Direct care time spent 20 minutes including chart review and documentation  Collaboration of Care: Primary Care Provider AEB notes and chart/referral reviewed  Patient/Guardian was advised Release of Information must be obtained prior to any record release in order to collaborate their care with an outside provider. Patient/Guardian was advised if they have not already done so to contact the registration department to sign all necessary forms in order for [place] to release information regarding their care.   Consent: Patient/Guardian gives verbal consent for treatment and assignment of benefits for services provided during this visit. Patient/Guardian expressed understanding and agreed to proceed.   Thresa Ross, MD 12/3/20242:50 PM

## 2022-12-17 ENCOUNTER — Encounter: Payer: Self-pay | Admitting: Student

## 2022-12-17 ENCOUNTER — Telehealth (HOSPITAL_COMMUNITY): Payer: Self-pay | Admitting: *Deleted

## 2022-12-17 MED ORDER — BUPRENORPHINE HCL-NALOXONE HCL 4-1 MG SL FILM: 1.0000 | ORAL_FILM | Freq: Two times a day (BID) | SUBLINGUAL | 0 refills | Status: DC

## 2022-12-17 NOTE — Telephone Encounter (Signed)
OLANZapine zydis (ZYPREXA) 15 MG disintegrating tablet reason why she can't swallow & can't take regular tablets

## 2022-12-17 NOTE — Telephone Encounter (Signed)
Please address.

## 2022-12-18 ENCOUNTER — Telehealth (HOSPITAL_COMMUNITY): Payer: Self-pay | Admitting: *Deleted

## 2022-12-18 MED ORDER — OLANZAPINE 15 MG PO TBDP: 15.0000 mg | ORAL_TABLET | Freq: Every day | ORAL | 0 refills | Status: DC

## 2022-12-18 NOTE — Telephone Encounter (Signed)
PLEASE SEND NEW Rx SCRIPT FOR APPROVED MEDICATION::   OLANZapine zydis (ZYPREXA) 15 MG disintegrating tablet    QUANTITY 90

## 2022-12-18 NOTE — Telephone Encounter (Signed)
[facility]  OLANZapine zydis (ZYPREXA) 15 MG disintegrating tablet   QUANTITY 90  APPROVED:  12/18/22 THRU 12/18/23 ALL STRENGTHS OF THE DRUG ARE APPROVED

## 2022-12-18 NOTE — Telephone Encounter (Signed)
THE SCRIPT CURRENTLY IN SYSTEM IS FOR A 30 DAY SUPPLY WILL NEED PROVIDER TO WRITE NEW SCRIPT FOR THE  90 DAY SUPPLY

## 2022-12-18 NOTE — Addendum Note (Signed)
Addended by: Thresa Ross on: 12/18/2022 11:01 AM   Modules accepted: Orders

## 2022-12-22 ENCOUNTER — Ambulatory Visit: Payer: MEDICAID | Admitting: Student

## 2022-12-22 ENCOUNTER — Encounter: Payer: Self-pay | Admitting: Student

## 2022-12-22 VITALS — BP 114/74 | HR 66 | Temp 98.4°F | Ht 64.0 in | Wt 138.8 lb

## 2022-12-22 DIAGNOSIS — Z3041 Encounter for surveillance of contraceptive pills: Secondary | ICD-10-CM | POA: Diagnosis not present

## 2022-12-22 DIAGNOSIS — F1190 Opioid use, unspecified, uncomplicated: Secondary | ICD-10-CM

## 2022-12-22 DIAGNOSIS — F315 Bipolar disorder, current episode depressed, severe, with psychotic features: Secondary | ICD-10-CM | POA: Diagnosis not present

## 2022-12-22 DIAGNOSIS — Z3009 Encounter for other general counseling and advice on contraception: Secondary | ICD-10-CM

## 2022-12-22 MED ORDER — BUPRENORPHINE HCL-NALOXONE HCL 4-1 MG SL FILM: 1.0000 | ORAL_FILM | Freq: Two times a day (BID) | SUBLINGUAL | 0 refills | Status: AC

## 2022-12-22 MED ORDER — BUPRENORPHINE HCL-NALOXONE HCL 4-1 MG SL FILM: 1.0000 | ORAL_FILM | Freq: Two times a day (BID) | SUBLINGUAL | 0 refills | Status: DC

## 2022-12-22 MED ORDER — NORGESTIMATE-ETH ESTRADIOL 0.25-35 MG-MCG PO TABS: 1.0000 | ORAL_TABLET | Freq: Every day | ORAL | 11 refills | Status: DC

## 2022-12-22 NOTE — Assessment & Plan Note (Addendum)
She is doing well.  She is approaching 5 years of sobriety, an anniversary to be celebrated since this was achieved in January 2020.  Remains on Suboxone 4-1 mg films twice a day, no acute concerns, no withdrawal side effects, no return to previous drugs of abuse.  PDMP is appropriate.  Tox assure was done 3 months ago, will defer today. - Refill Suboxone 4-1 mg films twice a day, 3 separate prescriptions 1 month apart my final prescription ends in early April.

## 2022-12-22 NOTE — Progress Notes (Signed)
   CC: OUD follow up suboxone therapy  HPI:  Marcia West is a 37 y.o. female living with a history stated below and presents today for OUD, no acute complaints. Please see problem based assessment and plan for additional details.  Past Medical History:  Diagnosis Date   Anxiety    Bipolar affective (HCC)    Depression    Herpes simplex type II infection    Substance abuse (HCC)     Review of Systems: ROS negative except for what is noted on the assessment and plan.  Vitals:   12/22/22 1031  BP: 114/74  Pulse: 66  Temp: 98.4 F (36.9 C)  TempSrc: Oral  SpO2: 100%  Weight: 138 lb 12.8 oz (63 kg)  Height: 5\' 4"  (1.626 m)    Physical Exam: Constitutional: well-appearing female sitting in chair, in no acute distress Cardiovascular: regular rate and rhythm, no m/r/g Pulmonary/Chest: normal work of breathing on room air, lungs clear to auscultation bilaterally Skin: warm and dry Psych: normal mood and behavior   Assessment & Plan:   Patient discussed with Dr. Antony Contras  Opioid use disorder She is doing well.  She is approaching 5 years of sobriety, an anniversary to be celebrated since this was achieved in January 2020.  Remains on Suboxone 4-1 mg films twice a day, no acute concerns, no withdrawal side effects, no return to previous drugs of abuse.  PDMP is appropriate.  Tox assure was done 3 months ago, will defer today. - Refill Suboxone 4-1 mg films twice a day, 3 separate prescriptions 1 month apart my final prescription ends in early April.  Bipolar affective disorder, depressed, severe, with psychotic behavior (HCC) She has establish care with psychiatry.  There, olanzapine 15 mg was started.  She reports feeling a whole lot better since then. - Continue olanzapine 15 mg nightly per psychiatry  Oral contraceptive use Refills of norgestimate-ethinyl estradiol (Sprintec) 0.25-35 mg provided  RTC 3 months OUD.  Katheran James, D.O. [facility]  [facility], PGY-1 Phone: [phone] Date 12/22/2022 Time 11:02 AM

## 2022-12-22 NOTE — Assessment & Plan Note (Signed)
She has establish care with psychiatry.  There, olanzapine 15 mg was started.  She reports feeling a whole lot better since then. - Continue olanzapine 15 mg nightly per psychiatry

## 2022-12-22 NOTE — Assessment & Plan Note (Signed)
Refills of norgestimate-ethinyl estradiol (Sprintec) 0.25-35 mg provided

## 2022-12-26 NOTE — Progress Notes (Signed)
Internal Medicine Clinic Attending  Case discussed with the resident at the time of the visit.  We reviewed the resident's history and exam and pertinent patient test results.  I agree with the assessment, diagnosis, and plan of care documented in the resident's note.  

## 2023-01-11 ENCOUNTER — Other Ambulatory Visit (HOSPITAL_COMMUNITY): Payer: Self-pay | Admitting: Psychiatry

## 2023-01-18 ENCOUNTER — Other Ambulatory Visit: Payer: Self-pay | Admitting: Student

## 2023-01-18 DIAGNOSIS — F1190 Opioid use, unspecified, uncomplicated: Secondary | ICD-10-CM

## 2023-01-20 ENCOUNTER — Ambulatory Visit (INDEPENDENT_AMBULATORY_CARE_PROVIDER_SITE_OTHER): Payer: MEDICAID | Admitting: Licensed Clinical Social Worker

## 2023-01-20 DIAGNOSIS — F1190 Opioid use, unspecified, uncomplicated: Secondary | ICD-10-CM | POA: Diagnosis not present

## 2023-01-20 DIAGNOSIS — F319 Bipolar disorder, unspecified: Secondary | ICD-10-CM
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
## 2023-02-16 ENCOUNTER — Other Ambulatory Visit (HOSPITAL_COMMUNITY): Payer: Self-pay | Admitting: Psychiatry

## 2023-02-18 ENCOUNTER — Other Ambulatory Visit: Payer: Self-pay | Admitting: Student

## 2023-02-18 DIAGNOSIS — F1190 Opioid use, unspecified, uncomplicated: Secondary | ICD-10-CM

## 2023-02-19 ENCOUNTER — Encounter: Payer: Self-pay | Admitting: *Deleted

## 2023-02-19 NOTE — Telephone Encounter (Deleted)
 Marcia West

## 2023-02-19 NOTE — Telephone Encounter (Signed)
 RX dated 02/16/23 already at the pharmacy.

## 2023-03-03 ENCOUNTER — Ambulatory Visit (HOSPITAL_COMMUNITY): Payer: MEDICAID | Admitting: Licensed Clinical Social Worker

## 2023-03-16 ENCOUNTER — Encounter (HOSPITAL_COMMUNITY): Payer: Self-pay

## 2023-03-16 ENCOUNTER — Ambulatory Visit (INDEPENDENT_AMBULATORY_CARE_PROVIDER_SITE_OTHER): Payer: MEDICAID | Admitting: Licensed Clinical Social Worker

## 2023-03-16 DIAGNOSIS — F319 Bipolar disorder, unspecified: Secondary | ICD-10-CM

## 2023-03-17 ENCOUNTER — Ambulatory Visit (INDEPENDENT_AMBULATORY_CARE_PROVIDER_SITE_OTHER): Payer: MEDICAID | Admitting: Psychiatry

## 2023-03-17 ENCOUNTER — Encounter (HOSPITAL_COMMUNITY): Payer: Self-pay | Admitting: Psychiatry

## 2023-03-17 VITALS — BP 115/78 | HR 74 | Ht 64.0 in | Wt 160.0 lb

## 2023-03-17 DIAGNOSIS — F17200 Nicotine dependence, unspecified, uncomplicated: Secondary | ICD-10-CM | POA: Diagnosis not present

## 2023-03-17 DIAGNOSIS — F411 Generalized anxiety disorder: Secondary | ICD-10-CM

## 2023-03-17 DIAGNOSIS — F1190 Opioid use, unspecified, uncomplicated: Secondary | ICD-10-CM | POA: Diagnosis not present

## 2023-03-17 DIAGNOSIS — F319 Bipolar disorder, unspecified: Secondary | ICD-10-CM | POA: Diagnosis not present

## 2023-03-17 MED ORDER — OLANZAPINE 15 MG PO TBDP: 15.0000 mg | ORAL_TABLET | Freq: Every day | ORAL | 1 refills | Status: DC

## 2023-03-17 NOTE — Progress Notes (Signed)
 THERAPIST PROGRESS NOTE  Session Time: 3:00 pm-3:45 pm  Type of Therapy: Individual Therapy  Purpose of session/Treatment Goals addressed: Marcia West will manage mood, anxiety, and sobriety as evidenced by coping with transitions /changes, improve relationship with daughter, and maintain sobriety for 5 out of 7 days for 60 days.    Interventions: Therapist utilized CBT and  to address mood and anxiety. Therapist provided support and empathy to patient during session. Therapist administered the PHQ9 and GAD7. Therapist provided support and empathy to patient. Therapist worked with patient on identify goals for treatment and completing the treatment plan. Therapist provided psychoeducation on CBT and worked with patient on his thoughts, feelings and behaviors.    Effectiveness: Patient was oriented x4 (person, place, situation, and time) . Patient was  Appropriate. Patient was Casually dressed. Patient completed a PHQ9 and GAD7. Patient was able to identify goals for treatment that she would like to focus on during treatment. Patient understood CBT. She understood how her thoughts, feelings, and behaviors are related. Patient is going to work on paying attention to her thoughts, feelings, and behaviors. She was provided a thought tracker.   Patient engaged in session. Patient responded well to interventions. Patient continues to meet criteria for Bipolar affective disorder, remission status unspecified (HCC)  Patient will continue in outpatient therapy due to being the least restrictive service to meet her needs. Patient made minimal progress on her goals at this time.       03/16/2023    3:20 PM 01/21/2023   12:27 PM 11/13/2022   12:04 PM 11/13/2022   11:24 AM 09/22/2022   10:27 AM  Depression screen PHQ 2/9  Decreased Interest 2 0 2 0 2  Down, Depressed, Hopeless 2 1 1 1 1   PHQ - 2 Score 4 1 3 1 3   Altered sleeping 0  3  3  Tired, decreased energy 3  1  0  Change in appetite 0  3  0  Feeling bad  or failure about yourself  2  3  3   Trouble concentrating 0  3  3  Moving slowly or fidgety/restless 0  3  3  Suicidal thoughts 0  1  0  PHQ-9 Score 9  20  15   Difficult doing work/chores   Somewhat difficult  Somewhat difficult       03/16/2023    3:20 PM 11/13/2022   12:06 PM 08/28/2022    4:29 PM 04/02/2022    1:04 PM  GAD 7 : Generalized Anxiety Score  Nervous, Anxious, on Edge 1 2 3 2   Control/stop worrying 2 3 3 1   Worry too much - different things 2 3 3 2   Trouble relaxing 1 3 3 1   Restless 0 3 0 1  Easily annoyed or irritable 0 3 3 1   Afraid - awful might happen 2 3  2   Total GAD 7 Score 8 20  10   Anxiety Difficulty Somewhat difficult Very difficult Somewhat difficult       Suicidal/Homicidal:  No without intent/plan   History of alcohol and/or substance misuse  Protective Factors: positive social support, responsibility to others (children, family), and coping skills  Plan: Patient will return in 2-4 weeks.   Diagnosis: Axis I: Bipolar affective disorder, remission status unspecified (HCC)    Collaboration of Care: Psychiatrist AEB Dr. Kalman Jewels  Patient/Guardian was advised Release of Information must be obtained prior to any record release in order to collaborate their care with an outside provider. Patient/Guardian was advised  if they have not already done so to contact the registration department to sign all necessary forms in order for [place] to release information regarding their care.   Consent: Patient/Guardian gives verbal consent for treatment and assignment of benefits for services provided during this visit. Patient/Guardian expressed understanding and agreed to proceed.

## 2023-03-17 NOTE — Progress Notes (Signed)
 [facility] Follow up visit   Patient Identification: Marcia West MRN:  [id number] Date of Evaluation:  03/17/2023 Referral Source: primary care Chief Complaint:   Chief Complaint  Patient presents with   Follow-up   Visit Diagnosis:    ICD-10-CM   1. Bipolar affective disorder, remission status unspecified (HCC)  F31.9     2. Opioid use disorder  F11.90     3. GAD (generalized anxiety disorder)  F41.1     4. Tobacco use disorder  F17.200       History of Present Illness: Patient is a 38 years old currently single Caucasian female was living with her mom and her husband she currently works full-time in [employer].  She has 3 kids age 42, 8, 36 of the 23 years old daughter lives with her.  Referred initially by primary care physician establish care for history of bipolar disorder insomnia and drug use She wanted to change provider and has been following with [facility] but not feel that she was being heard and has been non compliant with her meds for months now.   Patient gives a complicated a long history of having episodes of depression and also of possible mania with episodes of depression  Stress related to her sons , one of which she has difficulty to see  On eval doing fair, increased olanzapine to 15mg  has helped, she understands to keep up with weight maintenance Also have cut down nicotine to 2 a day Is in therapy to work on depression  No craving for opiods since on suboxone  Nicotine patch helped to stop smoking near to a couple of now a day  States have stopped using THC  Aggravating factors; past relationship concerns,  difficult childhood and also substance use  Modifying factors; her current living situation with her mom, walking, her children  Duration since age 38    Severity  better      Past Psychiatric History: bipolar, gad, opioid use disorder  Previous Psychotropic Medications: Yes  Lithium but doesn't want to do blood work Substance Abuse History in  the last 12 months:  Yes.    Consequences of Substance Abuse: On suboxone for maintenance   Past Medical History:  Past Medical History:  Diagnosis Date   Anxiety    Bipolar affective (HCC)    Depression    Herpes simplex type II infection    Substance abuse (HCC)     Past Surgical History:  Procedure Laterality Date   NO PAST SURGERIES     WISDOM TOOTH EXTRACTION      Family Psychiatric History: not know  Family History:  Family History  Problem Relation Age of Onset   Arthritis Maternal Grandmother    Diabetes Maternal Grandmother    Diabetes Maternal Grandfather    Obesity Paternal Grandmother     Social History:   Social History   Socioeconomic History   Marital status: Single    Spouse name: Not on file   Number of children: Not on file   Years of education: Not on file   Highest education level: Not on file  Occupational History   Not on file  Tobacco Use   Smoking status: Every Day    Current packs/day: 0.25    Types: Cigarettes   Smokeless tobacco: Never   Tobacco comments:    4 cigs per day  Vaping Use   Vaping status: Never Used  Substance and Sexual Activity   Alcohol use: Not Currently   Drug  use: Not Currently    Types: Methamphetamines, Other-see comments, Marijuana, "Crack" cocaine    Comment: suboxone daily   Sexual activity: Not Currently    Birth control/protection: None  Other Topics Concern   Not on file  Social History Narrative   Not on file   Social Drivers of Health   Financial Resource Strain: Not on file  Food Insecurity: No Food Insecurity (12/27/2021)   Hunger Vital Sign    Worried About Running Out of Food in the Last Year: Never true    Ran Out of Food in the Last Year: Never true  Transportation Needs: No Transportation Needs (12/27/2021)   PRAPARE - Administrator, Civil Service (Medical): No    Lack of Transportation (Non-Medical): No  Recent Concern: Transportation Needs - Unmet Transportation Needs  (11/29/2021)   PRAPARE - Administrator, Civil Service (Medical): Yes    Lack of Transportation (Non-Medical): Yes  Physical Activity: Not on file  Stress: Not on file  Social Connections: Moderately Isolated (11/29/2021)   Social Connection and Isolation Panel [NHANES]    Frequency of Communication with Friends and Family: More than three times a week    Frequency of Social Gatherings with Friends and Family: More than three times a week    Attends Religious Services: More than 4 times per year    Active Member of [employer] or Organizations: No    Attends Engineer, structural: Never    Marital Status: Never married    Allergies:  No Known Allergies  Metabolic Disorder Labs: Lab Results  Component Value Date   HGBA1C 4.9 09/16/2021   MPG 93.93 09/16/2021   Lab Results  Component Value Date   PROLACTIN 8.2 12/25/2020   Lab Results  Component Value Date   CHOL 109 09/16/2021   TRIG 41 09/16/2021   HDL 46 09/16/2021   CHOLHDL 2.4 09/16/2021   VLDL 8 09/16/2021   LDLCALC 55 09/16/2021   Lab Results  Component Value Date   TSH 2.270 12/27/2021    Therapeutic Level Labs: Lab Results  Component Value Date   LITHIUM 0.3 (L) 12/27/2021   No results found for: "CBMZ" No results found for: "VALPROATE"  Current Medications: Current Outpatient Medications  Medication Sig Dispense Refill   Buprenorphine HCl-Naloxone HCl (SUBOXONE) 4-1 MG FILM Place 1 Film under the tongue in the morning and at bedtime. 60 each 0   [START ON 03/19/2023] Buprenorphine HCl-Naloxone HCl (SUBOXONE) 4-1 MG FILM Place 1 Film under the tongue in the morning and at bedtime. 60 each 0   nicotine (NICODERM CQ - DOSED IN MG/24 HOURS) 14 mg/24hr patch Place 1 patch (14 mg total) onto the skin daily. 28 patch 0   norgestimate-ethinyl estradiol (SPRINTEC 28) 0.25-35 MG-MCG tablet Take 1 tablet by mouth daily. 30 tablet 11   OLANZapine zydis (ZYPREXA) 15 MG disintegrating tablet Take 1 tablet  (15 mg total) by mouth at bedtime. 30 tablet 1   No current facility-administered medications for this visit.    Psychiatric Specialty Exam: Review of Systems  Cardiovascular:  Negative for chest pain.  Neurological:  Negative for tremors.  Psychiatric/Behavioral:  Negative for sleep disturbance.     Blood pressure 115/78, pulse 74, height 5\' 4"  (1.626 m), weight 160 lb (72.6 kg).Body mass index is 27.46 kg/m.  General Appearance: Casual  Eye Contact:  Fair  Speech:  Clear and Coherent  Volume:  Normal  Mood: fair  Affect:  Constricted  Thought  Process:  Goal Directed  Orientation:  Full (Time, Place, and Person)  Thought Content:  Rumination  Suicidal Thoughts:  No  Homicidal Thoughts:  No  Memory:  Immediate;   Fair  Judgement:  Fair  Insight:  Shallow  Psychomotor Activity:  Normal  Concentration:  Concentration: Fair  Recall:  Fair  Fund of Knowledge:Good  Language: Good  Akathisia:  No  Handed:    AIMS (if indicated):  no involuntary movements  Assets:  Desire for Improvement Social Support  ADL's:  Intact  Cognition: WNL  Sleep:  Poor   Screenings: AIMS    Flowsheet Row Admission (Discharged) from 01/19/2020 in [facility]IENT BE[facility]ICINE Admission (Discharged) from 11/23/2019 in [facility] INPATIENT BEHAV[facility]AL MEDICINE Admission (Discharged) from 10/15/2019 in [facility] INPATIENT BEHAVIORAL MEDICINE  AIMS Total Score 0 0 0      AUDIT    Flowsheet Row Admission (Discharged) from 01/19/2020 in [facility] INPATIENT BEHAVIORAL MEDICINE Admission (Discharged) from 11/23/2019 in [facility] INPATIENT BEHAVIORAL MEDICINE Admission (Discharged) from 10/15/2019 in [facility] INPATIENT BEHAVIORAL MEDICINE  Alcohol Use Disorder Identification Test Final Score (AUDIT) 27 25 3       GAD-7    Flowsheet Row Counselor from 03/16/2023 in [facility] at [facility] Office Visit from 11/13/2022 in [facility] at [facility]  Office Visit from 04/02/2022 in [facility] Internal Med [facility] - A Dept Of [place]. [facility] Office Visit from 12/25/2020 in [facility] Internal Med Ctr - A Dept Of [place]. [facility] Office Visit from 11/01/2019 in [facility] - A Dept Of [facility]  Total GAD-7 Score 8 20 10 14 17       PHQ2-9    Flowsheet Row Counselor from 03/16/2023 in [facility] at [facility] Counselor from 01/20/2023 in [facility]patient Behavior[facility] Health at [facility] Office Visit from 11/13/2022 in [facility] Outpatient Behavior[facility] Health at [facility] Office Visit from 09/22/2022 in [facility] Internal Med Ctr - A Dept Of [place]. [facility] Office Visit from 08/28/2022 in [facility] Internal Med Ctr - A Dept Of [place]. [facility]  PHQ-2 Total Score 4 1 3 3 3   PHQ-9 Total Score 9 -- 20 15 24       Flowsheet Row Counselor from 01/20/2023 in [facility] at [facility] Office Visit from 11/13/2022 in [facility] Outpatient Behavioral Health at [facility] ED from 09/16/2021 in [facility]  C-SSRS RISK CATEGORY No Risk No Risk No Risk       Assessment and Plan: as follows  Prior documentation reviewed  Bipolar disorder per history current episode depressed; fair and manageable continue zydis Labs being followed with primary care. Discussed to get EKG done by PCP and reviewed metabolic concerns if any with zyprexa   Generalized anxiety disorder better and not taking buspar , continue therapy and coping skills  Insomnia; better did not endorse concerns, also on olanzaping   Substance use disorder she is well-maintained on Suboxone she will follow-up with the Suboxone clinic.  Discussed to abstain from [facility]  Continue relapse prevention   Fu 3 m.   Direct care time spent 20 minutes   including chart review and documentation  Collaboration of Care: Primary Care Provider AEB notes and chart/referral reviewed  Patient/Guardian was advised Release of Information must be obtained prior to any record release in order  to collaborate their care with an outside provider. Patient/Guardian was advised if they have not already done so to contact the registration department to sign all necessary forms in order for [place] to release information regarding their care.   Consent: Patient/Guardian gives verbal consent for treatment and assignment of benefits for services provided during this visit. Patient/Guardian expressed understanding and agreed to proceed.   Thresa Ross, MD 3/4/20253:43 PM

## 2023-03-19 ENCOUNTER — Telehealth (HOSPITAL_COMMUNITY): Payer: Self-pay | Admitting: *Deleted

## 2023-03-19 NOTE — Telephone Encounter (Signed)
 Checking last refill date

## 2023-03-23 ENCOUNTER — Ambulatory Visit: Payer: MEDICAID | Admitting: Student

## 2023-03-23 ENCOUNTER — Telehealth: Payer: Self-pay

## 2023-03-23 ENCOUNTER — Encounter: Payer: Self-pay | Admitting: Student

## 2023-03-23 VITALS — BP 120/89 | HR 86 | Temp 98.5°F | Wt 158.4 lb

## 2023-03-23 DIAGNOSIS — F17200 Nicotine dependence, unspecified, uncomplicated: Secondary | ICD-10-CM

## 2023-03-23 DIAGNOSIS — F1190 Opioid use, unspecified, uncomplicated: Secondary | ICD-10-CM | POA: Diagnosis not present

## 2023-03-23 DIAGNOSIS — F17210 Nicotine dependence, cigarettes, uncomplicated: Secondary | ICD-10-CM | POA: Diagnosis not present

## 2023-03-23 MED ORDER — BUPRENORPHINE HCL-NALOXONE HCL 4-1 MG SL FILM: 1.0000 | ORAL_FILM | Freq: Two times a day (BID) | SUBLINGUAL | 0 refills | Status: DC

## 2023-03-23 MED ORDER — BUPRENORPHINE HCL-NALOXONE HCL 4-1 MG SL FILM: 1.0000 | ORAL_FILM | Freq: Two times a day (BID) | SUBLINGUAL | 0 refills | Status: AC

## 2023-03-23 MED ORDER — BUPRENORPHINE HCL-NALOXONE HCL 4-1 MG SL FILM
1.0000 | ORAL_FILM | Freq: Two times a day (BID) | SUBLINGUAL | 0 refills | Status: DC
Start: 2023-06-20 — End: 2023-07-20

## 2023-03-23 NOTE — Progress Notes (Signed)
 Internal Medicine Clinic Attending  Case discussed with the resident at the time of the visit.  We reviewed the resident's history and exam and pertinent patient test results.  I agree with the assessment, diagnosis, and plan of care documented in the resident's note.

## 2023-03-23 NOTE — Assessment & Plan Note (Signed)
 Continues to smoke, but less. She has nicotine patches. Currently 3-4 cigarettes per day. Her long-term goal is to quit. Not initiating other medicines today.

## 2023-03-23 NOTE — Telephone Encounter (Signed)
 Prior Authorization for patient (Buprenorphine HCl-Naloxone HCl 4-1MG  films) came through on cover my meds was submitted with last office notes and labs awaiting approval or denial.  ZOX:[id number]

## 2023-03-23 NOTE — Progress Notes (Signed)
   CC: Suboxone visit  HPI:  Marcia West is a 38 y.o. female living with a history stated below and presents today for OUD/suboxone. Please see problem based assessment and plan for additional details.  Past Medical History:  Diagnosis Date   Anxiety    Bipolar affective (HCC)    Depression    Herpes simplex type II infection    Substance abuse (HCC)     Review of Systems: ROS negative except for what is noted on the assessment and plan.  Vitals:   03/23/23 1026  BP: 120/89  Pulse: 86  Temp: 98.5 F (36.9 C)  TempSrc: Oral  SpO2: 100%  Weight: 158 lb 6.4 oz (71.8 kg)    Physical Exam: Constitutional: well-appearing female in no acute distress HENT: normocephalic atraumatic Eyes: conjunctiva non-erythematous Cardiovascular: regular rate and rhythm, no m/r/g Pulmonary/Chest: normal work of breathing on room air, lungs clear to auscultation bilaterally MSK: normal bulk and tone Neurological: alert & oriented x 3 Skin: warm and dry Psych: normal mood and behavior   Assessment & Plan:   Patient discussed with Dr.  Deirdre Priest  Opioid use disorder She is doing well.  She celebrated 5 years of sobriety in January, very well done.  Remains on Suboxone 4-1 mg films twice a day, no acute concerns, no withdrawal side effects, no return to previous drugs of abuse.  PDMP is appropriate.  Tox assure collected today. - Refill Suboxone 4-1 mg films twice a day. She has one refill [facility] at [facility]. Will write 3 additional separate prescriptions 1 month apart for 4/6-5/6, 5/7-6/6,  6/7-7/7. Timing a bit off, at next visit may only need two refills.  Tobacco use disorder Continues to smoke, but less. She has nicotine patches. Currently 3-4 cigarettes per day. Her long-term goal is to quit. Not initiating other medicines today.  RTC 3 months for suboxone.  Katheran James, D.O. [facility] Internal Medicine, PGY-1 Phone: [phone] Date 03/23/2023 Time 10:47 AM

## 2023-03-23 NOTE — Assessment & Plan Note (Addendum)
 She is doing well.  She celebrated 5 years of sobriety in January, very well done.  Remains on Suboxone 4-1 mg films twice a day, no acute concerns, no withdrawal side effects, no return to previous drugs of abuse.  PDMP is appropriate.  Tox assure collected today. - Refill Suboxone 4-1 mg films twice a day. She [facility]. Will write 3 additional separate prescriptions 1 month apart for 4/6-5/6, 5/7-6/6,  6/7-7/7. Timing a bit off, at next visit may only need two refills.

## 2023-03-24 NOTE — Telephone Encounter (Signed)
 Marcia West (Key: [id number]) PA Case ID #: [id number] Rx #: [id number] Need Help? Call us at [phone] Outcome Denied on March 10 by [employer] 2017 Denied Drug Buprenorphine HCl-Naloxone HCl 4-1MG  films ePA cloud logo Form [employer] Electronic Prior Authorization Form Original Claim Info [id number] Concomitant use of an Opioid and Antipsychotic. . Call Help Desk at [phone] for assistance. Prescriber with no effective DEA License. Provide valid DEA license evidence for claim resubmission..   No additional information listed on why this request has been denied.

## 2023-03-26 LAB — TOXASSURE SELECT,+ANTIDEPR,UR

## 2023-04-01 ENCOUNTER — Ambulatory Visit (HOSPITAL_COMMUNITY): Payer: MEDICAID | Admitting: Licensed Clinical Social Worker

## 2023-04-13 ENCOUNTER — Ambulatory Visit (HOSPITAL_COMMUNITY): Payer: MEDICAID | Admitting: Licensed Clinical Social Worker

## 2023-04-13 ENCOUNTER — Encounter (HOSPITAL_COMMUNITY): Payer: Self-pay | Admitting: Licensed Clinical Social Worker

## 2023-06-16 ENCOUNTER — Ambulatory Visit (HOSPITAL_COMMUNITY): Payer: MEDICAID | Admitting: Psychiatry

## 2023-07-08 ENCOUNTER — Encounter: Payer: Self-pay | Admitting: Student

## 2023-07-14 ENCOUNTER — Encounter (HOSPITAL_COMMUNITY): Payer: Self-pay | Admitting: Psychiatry

## 2023-07-14 ENCOUNTER — Ambulatory Visit (INDEPENDENT_AMBULATORY_CARE_PROVIDER_SITE_OTHER): Payer: MEDICAID | Admitting: Psychiatry

## 2023-07-14 VITALS — BP 111/77 | HR 69 | Ht 64.0 in | Wt 158.0 lb

## 2023-07-14 DIAGNOSIS — F319 Bipolar disorder, unspecified: Secondary | ICD-10-CM

## 2023-07-14 DIAGNOSIS — F5102 Adjustment insomnia: Secondary | ICD-10-CM | POA: Diagnosis not present

## 2023-07-14 DIAGNOSIS — F1190 Opioid use, unspecified, uncomplicated: Secondary | ICD-10-CM

## 2023-07-14 DIAGNOSIS — F411 Generalized anxiety disorder: Secondary | ICD-10-CM | POA: Diagnosis not present

## 2023-07-14 MED ORDER — OLANZAPINE 15 MG PO TBDP: 15.0000 mg | ORAL_TABLET | Freq: Every day | ORAL | 1 refills | Status: DC

## 2023-07-14 MED ORDER — BUPROPION HCL ER (SR) 100 MG PO TB12: 100.0000 mg | ORAL_TABLET | Freq: Every day | ORAL | 0 refills | Status: DC

## 2023-07-14 NOTE — Progress Notes (Signed)
 [facility] Follow up visit   Patient Identification: Marcia West MRN:  [id number] Date of Evaluation:  07/14/2023 Referral Source: primary care Chief Complaint:   Chief Complaint  Patient presents with   Follow-up   Visit Diagnosis:    ICD-10-CM   1. Bipolar affective disorder, remission status unspecified (HCC)  F31.9 Lipid panel    Basic Metabolic Panel (BMET)    TSH    Hepatic function panel    Hepatic function panel    CBC with Differential/Platelet    Lipid panel    TSH    2. Opioid use disorder  F11.90     3. GAD (generalized anxiety disorder)  F41.1     4. Adjustment insomnia  F51.02       History of Present Illness: Patient is a 38 years old currently single Caucasian female was living with her mom and her husband she currently works full-time in [employer].  She has 3 kids age 20, 24, 62 of the 80 years old daughter lives with her.  Referred initially by primary care physician establish care for history of bipolar disorder insomnia and drug use She wanted to change provider and has been following with [facility] but not feel that she was being heard and there was some non compliance    Patient gives a complicated a long history of having episodes of depression and also of possible mania with episodes of depression   She remains sober she is on Suboxone  but is noticing some subdued days decreased motivation mostly stays in the bed and has responsibility of taking of the kids.  Her mom had a mini stroke today so she is has some worries about that  In general there is no increase in weight she is tolerating the olanzapine  that helps with sleep and her mood stability but she is having some amotivation during the daytime  No craving for opiods since on suboxone   Nicotine  patch helped to stop smoking near to a couple of now a day  States have stopped using THC  Aggravating factors; past relationship concerns,  difficult childhood and also substance use  Modifying factors;  her current living situation with her mom, walking, children  Duration since age 89    Severity decreased motivation      Past Psychiatric History: bipolar, gad, opioid use disorder  Previous Psychotropic Medications: Yes  Lithium  but doesn't want to do blood work Substance Abuse History in the last 12 months:  Yes.    Consequences of Substance Abuse: On suboxone  for maintenance   Past Medical History:  Past Medical History:  Diagnosis Date   Anxiety    Bipolar affective (HCC)    Depression    Herpes simplex type II infection    Substance abuse (HCC)     Past Surgical History:  Procedure Laterality Date   NO PAST SURGERIES     WISDOM TOOTH EXTRACTION      Family Psychiatric History: not know  Family History:  Family History  Problem Relation Age of Onset   Arthritis Maternal Grandmother    Diabetes Maternal Grandmother    Diabetes Maternal Grandfather    Obesity Paternal Grandmother     Social History:   Social History   Socioeconomic History   Marital status: Single    Spouse name: Not on file   Number of children: Not on file   Years of education: Not on file   Highest education level: Not on file  Occupational History   Not on  file  Tobacco Use   Smoking status: Every Day    Current packs/day: 0.25    Types: Cigarettes   Smokeless tobacco: Never   Tobacco comments:    4 cigs per day  Vaping Use   Vaping status: Never Used  Substance and Sexual Activity   Alcohol use: Not Currently   Drug use: Not Currently    Types: Methamphetamines, Other-see comments, Marijuana, Crack cocaine    Comment: suboxone  daily   Sexual activity: Not Currently    Birth control/protection: None  Other Topics Concern   Not on file  Social History Narrative   Not on file   Social Drivers of Health   Financial Resource Strain: Not on file  Food Insecurity: No Food Insecurity (12/27/2021)   Hunger Vital Sign    Worried About Running Out of Food in the Last  Year: Never true    Ran Out of Food in the Last Year: Never true  Transportation Needs: No Transportation Needs (12/27/2021)   PRAPARE - Administrator, Civil Service (Medical): No    Lack of Transportation (Non-Medical): No  Recent Concern: Transportation Needs - Unmet Transportation Needs (11/29/2021)   PRAPARE - Administrator, Civil Service (Medical): Yes    Lack of Transportation (Non-Medical): Yes  Physical Activity: Not on file  Stress: Not on file  Social Connections: Moderately Isolated (11/29/2021)   Social Connection and Isolation Panel    Frequency of Communication with Friends and Family: More than three times a week    Frequency of Social Gatherings with Friends and Family: More than three times a week    Attends Religious Services: More than 4 times per year    Active Member of Clubs or Organizations: No    Attends Engineer, structural: Never    Marital Status: Never married    Allergies:  No Known Allergies  Metabolic Disorder Labs: Lab Results  Component Value Date   HGBA1C 4.9 09/16/2021   MPG 93.93 09/16/2021   Lab Results  Component Value Date   PROLACTIN 8.2 12/25/2020   Lab Results  Component Value Date   CHOL 109 09/16/2021   TRIG 41 09/16/2021   HDL 46 09/16/2021   CHOLHDL 2.4 09/16/2021   VLDL 8 09/16/2021   LDLCALC 55 09/16/2021   Lab Results  Component Value Date   TSH 2.270 12/27/2021    Therapeutic Level Labs: Lab Results  Component Value Date   LITHIUM  0.3 (L) 12/27/2021   No results found for: CBMZ No results found for: VALPROATE  Current Medications: Current Outpatient Medications  Medication Sig Dispense Refill   buPROPion ER (WELLBUTRIN SR) 100 MG 12 hr tablet Take 1 tablet (100 mg total) by mouth daily. 30 tablet 0   Buprenorphine  HCl-Naloxone  HCl (SUBOXONE ) 4-1 MG FILM Place 1 Film under the tongue in the morning and at bedtime. 60 each 0   nicotine  (NICODERM CQ  - DOSED IN MG/24 HOURS)  14 mg/24hr patch Place 1 patch (14 mg total) onto the skin daily. 28 patch 0   norgestimate -ethinyl estradiol  (SPRINTEC 28) 0.25-35 MG-MCG tablet Take 1 tablet by mouth daily. 30 tablet 11   OLANZapine  zydis (ZYPREXA ) 15 MG disintegrating tablet Take 1 tablet (15 mg total) by mouth at bedtime. 30 tablet 1   No current facility-administered medications for this visit.    Psychiatric Specialty Exam: Review of Systems  Cardiovascular:  Negative for chest pain.  Neurological:  Negative for tremors.  Psychiatric/Behavioral:  Positive  for dysphoric mood. Negative for sleep disturbance.     Blood pressure 111/77, pulse 69, height 5' 4 (1.626 m), weight 158 lb (71.7 kg).Body mass index is 27.12 kg/m.  General Appearance: Casual  Eye Contact:  Fair  Speech:  Clear and Coherent  Volume:  Normal  Mood: Somewhat subdued  Affect:  Constricted  Thought Process:  Goal Directed  Orientation:  Full (Time, Place, and Person)  Thought Content:  Rumination  Suicidal Thoughts:  No  Homicidal Thoughts:  No  Memory:  Immediate;   Fair  Judgement:  Fair  Insight:  Shallow  Psychomotor Activity:  Normal  Concentration:  Concentration: Fair  Recall:  Fair  Fund of Knowledge:Good  Language: Good  Akathisia:  No  Handed:    AIMS (if indicated):  no involuntary movements  Assets:  Desire for Improvement Social Support  ADL's:  Intact  Cognition: WNL  Sleep:  Poor   Screenings: AIMS    Flowsheet Row Admission (Discharged) from 01/19/2020 in [facility]IENT BEHAVIORAL MEDICINE Admission (Discharged) from 11/23/2019 in [facility] INPATIENT BEHAVIORAL MEDICINE Admission (Discharged) from 10/15/2019 in [facility] INPATIENT BEHAVIORAL MEDICINE  AIMS Total Score 0 0 0   AUDIT    Flowsheet Row Admission (Discharged) from 01/19/2020 in [facility] INPATIENT BEHAVIORAL MEDICINE Admission (Discharged) from 11/23/2019 in [facility] INPATIENT BEHAVIORAL MEDICINE Admission (Discharged) from 10/15/2019 in [facility] INPATIENT BEHAVIORAL MEDICINE   Alcohol Use Disorder Identification Test Final Score (AUDIT) 27 25 3    GAD-7    Flowsheet Row Counselor from 03/16/2023 in [facility] at [facility] Office Visit from 11/13/2022 in [facility] Out[facility] at [facility] Office Visit from 04/02/2022 in [facility] - A Dept Of [place]. [facility] Office Visit from 12/25/2020 in [facility] - A Dept Of [place]. [facility] Office Visit from 11/01/2019 in [facility] Internal [facility] - A Dept Of [place]. [facility]  Total GAD-7 Score 8 20 10 14 17    PHQ2-9    Flowsheet Row Counselor from 03/16/2023 in [facility] Outpatient Behavioral Health at [facility] Counselor from 01/20/2023 in [facility] Outpatient Behavioral Health at [facility] Office Visit from 11/13/2022 in [facility] at [facility] Office Visit from 09/22/2022 in [facility] Internal Med Ctr - A Dept Of [place]. [facility] Office Visit from 08/28/2022 in [facility] Internal Med Ctr - A Dept Of [place]. [facility]  PHQ-2 Total Score 4 1 3 3 3   PHQ-9 Total Score 9 -- 20 15 24    Flowsheet Row Counselor from 01/20/2023 in [facility] Outpatient Behavioral [facility] at [facility] Office Visit from 11/13/2022 in [facility] Outpatient Behavioral Health at [facility] ED from 09/16/2021 in [facility]  C-SSRS RISK CATEGORY No Risk No Risk No Risk    Assessment and Plan: as follows  Prior documentation reviewed  Bipolar disorder per history current episode depressed; somewhat subdued with decreased motivation continue Zydis at night will add Wellbutrin small dose during the daytime   Labs requested and she is advised to get an EKG with her primary care physician and establish her primary care physician as well    Insomnia;  manageable olanzapine  reviewed sleep hygiene   Substance use disorder she is well-maintained on Suboxone  does not endorse craving has remained abstinent from [facility] discussed relapse prevention  Fu 4 to 6 weeks to review Wellbutrin  effect on depression and a motivation Call earlier if needed Direct care time spent 20 minutes  including chart review and documentation  Collaboration of Care: Primary Care Provider AEB notes and chart/referral reviewed  Patient/Guardian was advised Release of Information must be obtained prior to any record release in order to collaborate their care with an outside provider. Patient/Guardian was advised if they have not already done so to contact the registration department to sign all necessary forms in order for us  to release information regarding their care.   Consent: Patient/Guardian gives verbal consent for treatment and assignment of benefits for services provided during this visit. Patient/Guardian expressed understanding and agreed to proceed.   Jackey Flight, MD 7/1/20253:50 PM

## 2023-07-15 LAB — CBC WITH DIFFERENTIAL/PLATELET
Basophils Absolute: 0.1 10*3/uL (ref 0.0–0.2)
Basos: 1 %
EOS (ABSOLUTE): 0 10*3/uL (ref 0.0–0.4)
Eos: 1 %
Hematocrit: 44.5 % (ref 34.0–46.6)
Hemoglobin: 14.9 g/dL (ref 11.1–15.9)
Immature Grans (Abs): 0 10*3/uL (ref 0.0–0.1)
Immature Granulocytes: 0 %
Lymphocytes Absolute: 1.8 10*3/uL (ref 0.7–3.1)
Lymphs: 30 %
MCH: 33 pg (ref 26.6–33.0)
MCHC: 33.5 g/dL (ref 31.5–35.7)
MCV: 99 fL — ABNORMAL HIGH (ref 79–97)
Monocytes Absolute: 0.3 10*3/uL (ref 0.1–0.9)
Monocytes: 5 %
Neutrophils Absolute: 3.8 10*3/uL (ref 1.4–7.0)
Neutrophils: 63 %
Platelets: 228 10*3/uL (ref 150–450)
RBC: 4.51 x10E6/uL (ref 3.77–5.28)
RDW: 12.1 % (ref 11.7–15.4)
WBC: 6.1 10*3/uL (ref 3.4–10.8)

## 2023-07-15 LAB — LIPID PANEL
Chol/HDL Ratio: 3.4 ratio (ref 0.0–4.4)
Cholesterol, Total: 142 mg/dL (ref 100–199)
HDL: 42 mg/dL (ref >39)
LDL Chol Calc (NIH): 85 mg/dL (ref 0–99)
Triglycerides: 76 mg/dL (ref 0–149)
VLDL Cholesterol Cal: 15 mg/dL (ref 5–40)

## 2023-07-15 LAB — TSH: TSH: 0.918 u[IU]/mL (ref 0.450–4.500)

## 2023-07-15 LAB — HEPATIC FUNCTION PANEL
ALT: 11 IU/L (ref 0–32)
AST: 14 IU/L (ref 0–40)
Albumin: 4.1 g/dL (ref 3.9–4.9)
Alkaline Phosphatase: 55 IU/L (ref 44–121)
Bilirubin Total: 0.2 mg/dL (ref 0.0–1.2)
Bilirubin, Direct: 0.1 mg/dL (ref 0.00–0.40)
Total Protein: 6.9 g/dL (ref 6.0–8.5)

## 2023-07-20 ENCOUNTER — Other Ambulatory Visit: Payer: Self-pay

## 2023-07-20 ENCOUNTER — Ambulatory Visit: Payer: MEDICAID | Admitting: Student

## 2023-07-20 ENCOUNTER — Encounter: Payer: Self-pay | Admitting: Student

## 2023-07-20 VITALS — BP 109/68 | HR 68 | Temp 98.9°F | Ht 67.0 in | Wt 163.6 lb

## 2023-07-20 DIAGNOSIS — F1190 Opioid use, unspecified, uncomplicated: Secondary | ICD-10-CM

## 2023-07-20 MED ORDER — BUPRENORPHINE HCL-NALOXONE HCL 4-1 MG SL FILM: 1.0000 | ORAL_FILM | Freq: Two times a day (BID) | SUBLINGUAL | 2 refills | Status: AC

## 2023-07-20 NOTE — Progress Notes (Signed)
 CC: OUD follow up  HPI:  Marcia West is a 38 y.o. female living with a history stated below and presents today for OUD follow up. Please see problem based assessment and plan for additional details.  Past Medical History:  Diagnosis Date   Anxiety    Bipolar affective (HCC)    Depression    Herpes simplex type II infection    Substance abuse (HCC)     Current Outpatient Medications on File Prior to Visit  Medication Sig Dispense Refill   buPROPion  ER (WELLBUTRIN  SR) 100 MG 12 hr tablet Take 1 tablet (100 mg total) by mouth daily. 30 tablet 0   nicotine  (NICODERM CQ  - DOSED IN MG/24 HOURS) 14 mg/24hr patch Place 1 patch (14 mg total) onto the skin daily. 28 patch 0   norgestimate -ethinyl estradiol  (SPRINTEC 28) 0.25-35 MG-MCG tablet Take 1 tablet by mouth daily. 30 tablet 11   OLANZapine  zydis (ZYPREXA ) 15 MG disintegrating tablet Take 1 tablet (15 mg total) by mouth at bedtime. 30 tablet 1   No current facility-administered medications on file prior to visit.    Family History  Problem Relation Age of Onset   Arthritis Maternal Grandmother    Diabetes Maternal Grandmother    Diabetes Maternal Grandfather    Obesity Paternal Grandmother     Social History   Socioeconomic History   Marital status: Single    Spouse name: Not on file   Number of children: Not on file   Years of education: Not on file   Highest education level: Not on file  Occupational History   Not on file  Tobacco Use   Smoking status: Every Day    Current packs/day: 0.25    Types: Cigarettes   Smokeless tobacco: Never   Tobacco comments:    4 cigs per day  Vaping Use   Vaping status: Never Used  Substance and Sexual Activity   Alcohol use: Not Currently   Drug use: Not Currently    Types: Methamphetamines, Other-see comments, Marijuana, Crack cocaine    Comment: suboxone  daily   Sexual activity: Not Currently    Birth control/protection: None  Other Topics Concern   Not on file   Social History Narrative   Not on file   Social Drivers of Health   Financial Resource Strain: Not on file  Food Insecurity: No Food Insecurity (12/27/2021)   Hunger Vital Sign    Worried About Running Out of Food in the Last Year: Never true    Ran Out of Food in the Last Year: Never true  Transportation Needs: No Transportation Needs (12/27/2021)   PRAPARE - Administrator, Civil Service (Medical): No    Lack of Transportation (Non-Medical): No  Recent Concern: Transportation Needs - Unmet Transportation Needs (11/29/2021)   PRAPARE - Administrator, Civil Service (Medical): Yes    Lack of Transportation (Non-Medical): Yes  Physical Activity: Not on file  Stress: Not on file  Social Connections: Moderately Isolated (11/29/2021)   Social Connection and Isolation Panel    Frequency of Communication with Friends and Family: More than three times a week    Frequency of Social Gatherings with Friends and Family: More than three times a week    Attends Religious Services: More than 4 times per year    Active Member of [employer] or Organizations: No    Attends Banker Meetings: Never    Marital Status: Never married  Intimate Partner Violence: Not  At Risk (12/27/2021)   Humiliation, Afraid, Rape, and Kick questionnaire    Fear of Current or Ex-Partner: No    Emotionally Abused: No    Physically Abused: No    Sexually Abused: No    Review of Systems: ROS negative except for what is noted on the assessment and plan.  Vitals:   07/20/23 1315  BP: 109/68  Pulse: 68  Temp: 98.9 F (37.2 C)  TempSrc: Oral  SpO2: 100%  Weight: 163 lb 9.6 oz (74.2 kg)  Height: 5' 7 (1.702 m)    Physical Exam: Constitutional: well-appearing female  in no acute distress Cardiovascular: regular rate and rhythm, no m/r/g Pulmonary/Chest: normal work of breathing on room air, lungs clear to auscultation bilaterally Abdominal: soft, non-tender,  non-distended Psych: normal mood and affect  Assessment & Plan:   Opioid use disorder Doing well, currently on suboxone  4-1mg  films twice a day. States she has not relapsed, or had any cravings. She has been refilling this appropriately. She has been on this regimen for two years now, may be able to start weaning her off of this, which she is interested in. Will hold off for now, but at next visit I think it's reasonable to go to 4-1mg  daily.   Plan: - Toxassure today  - Continue suboxone  4-1mg  twice a day for now, consider going only once daily at next visit   Patient discussed with Dr. Machen  Arbutus Nelligan, M.D. [facility] Internal Medicine, PGY-3 Pager: [phone] Date 07/20/2023 Time 3:11 PM

## 2023-07-20 NOTE — Assessment & Plan Note (Signed)
 Doing well, currently on suboxone  4-1mg  films twice a day. States she has not relapsed, or had any cravings. She has been refilling this appropriately. She has been on this regimen for two years now, may be able to start weaning her off of this, which she is interested in. Will hold off for now, but at next visit I think it's reasonable to go to 4-1mg  daily.   Plan: - Toxassure today  - Continue suboxone  4-1mg  twice a day for now, consider going only once daily at next visit

## 2023-07-20 NOTE — Patient Instructions (Signed)
 Thank you so much for coming to the clinic today!   I have refilled your suboxone , and will follow up the results of the urine test. We will see you back in three months!   If you have any questions please feel free to the call the clinic at anytime at [phone]. It was a pleasure seeing you!  Best, Dr. Caston Coopersmith

## 2023-07-21 NOTE — Progress Notes (Signed)
 Internal Medicine Clinic Attending  Case discussed with the resident at the time of the visit.  We reviewed the resident's history and exam and pertinent patient test results.  I agree with the assessment, diagnosis, and plan of care documented in the resident's note.

## 2023-07-22 ENCOUNTER — Telehealth: Payer: Self-pay | Admitting: *Deleted

## 2023-07-22 NOTE — Telephone Encounter (Signed)
 Call to Pharmacy.  Problem with prescription was fixed.  Prescription being filled.  Patient was called and notified of. Copied from CRM [id number]. Topic: Clinical - Prescription Issue >> Jul 22, 2023  8:26 AM Marda MATSU wrote: Reason for CRM: patient calling saying when she spoke with the pharmacy there was an issue with the prescription and a new one needs to be sent.   [id number] - [place], [place] - [address] [place] [address] Phone: [phone] Fax: [phone]    Please advise.

## 2023-07-24 LAB — TOXASSURE SELECT,+ANTIDEPR,UR

## 2023-07-29 ENCOUNTER — Ambulatory Visit: Payer: Self-pay | Admitting: Student

## 2023-08-10 ENCOUNTER — Other Ambulatory Visit (HOSPITAL_COMMUNITY): Payer: Self-pay | Admitting: Psychiatry

## 2023-09-01 ENCOUNTER — Ambulatory Visit (HOSPITAL_COMMUNITY): Payer: MEDICAID | Admitting: Psychiatry

## 2023-09-12 ENCOUNTER — Other Ambulatory Visit (HOSPITAL_COMMUNITY): Payer: Self-pay | Admitting: Psychiatry

## 2023-10-06 ENCOUNTER — Encounter (HOSPITAL_COMMUNITY): Payer: Self-pay | Admitting: Psychiatry

## 2023-10-06 ENCOUNTER — Ambulatory Visit (INDEPENDENT_AMBULATORY_CARE_PROVIDER_SITE_OTHER): Payer: MEDICAID | Admitting: Psychiatry

## 2023-10-06 VITALS — BP 110/72 | HR 85 | Temp 98.5°F | Ht 66.75 in | Wt 168.0 lb

## 2023-10-06 DIAGNOSIS — F319 Bipolar disorder, unspecified: Secondary | ICD-10-CM

## 2023-10-06 DIAGNOSIS — F5102 Adjustment insomnia: Secondary | ICD-10-CM | POA: Diagnosis not present

## 2023-10-06 DIAGNOSIS — F411 Generalized anxiety disorder: Secondary | ICD-10-CM | POA: Diagnosis not present

## 2023-10-06 DIAGNOSIS — F1190 Opioid use, unspecified, uncomplicated: Secondary | ICD-10-CM

## 2023-10-06 MED ORDER — BUPROPION HCL ER (SR) 100 MG PO TB12: 100.0000 mg | ORAL_TABLET | Freq: Every day | ORAL | 1 refills | Status: DC

## 2023-10-06 MED ORDER — OLANZAPINE 15 MG PO TBDP: 15.0000 mg | ORAL_TABLET | Freq: Every day | ORAL | 1 refills | Status: DC

## 2023-10-06 NOTE — Progress Notes (Signed)
 [facility] Follow up visit   Patient Identification: Marcia West MRN:  [id number] Date of Evaluation:  10/06/2023 Referral Source: primary care Chief Complaint:   No chief complaint on file.  Visit Diagnosis:    ICD-10-CM   1. Bipolar affective disorder, remission status unspecified (HCC)  F31.9     2. Opioid use disorder  F11.90     3. GAD (generalized anxiety disorder)  F41.1     4. Adjustment insomnia  F51.02       History of Present Illness: Patient is a 38 years old currently single Caucasian female was living with her mom and her husband she currently works full-time in [employer].  She has 3 kids age 91, 50, 32 of the 32 years old daughter lives with her.  Referred initially by primary care physician establish care for history of bipolar disorder insomnia and drug use She wanted to change provider and has been following with [facility] but not feel that she was being heard and there was some non compliance    Patient gives a complicated a long history of having episodes of depression and also of possible mania with episodes of depression On evaluation patient doing better last visit Wellbutrin  was added for amotivation and depression that has helped she is more motivated.  She is planning to cut down on her Suboxone  as well there is no craving follows with her primary care physician as well labs reviewed lipids are normal   States have stopped using THC  Aggravating factors; past relationship concerns, difficult childhood and also substance use  Modifying factors; her current living situation with her mom, walking, children  Duration since age 63    Severity better    Past Psychiatric History: bipolar, gad, opioid use disorder  Previous Psychotropic Medications: Yes  Lithium  but doesn't want to do blood work Substance Abuse History in the last 12 months:  Yes.    Consequences of Substance Abuse: On suboxone  for maintenance   Past Medical History:  Past Medical  History:  Diagnosis Date   Anxiety    Bipolar affective (HCC)    Depression    Herpes simplex type II infection    Substance abuse (HCC)     Past Surgical History:  Procedure Laterality Date   NO PAST SURGERIES     WISDOM TOOTH EXTRACTION      Family Psychiatric History: not know  Family History:  Family History  Problem Relation Age of Onset   Arthritis Maternal Grandmother    Diabetes Maternal Grandmother    Diabetes Maternal Grandfather    Obesity Paternal Grandmother     Social History:   Social History   Socioeconomic History   Marital status: Single    Spouse name: Not on file   Number of children: Not on file   Years of education: Not on file   Highest education level: Not on file  Occupational History   Not on file  Tobacco Use   Smoking status: Every Day    Current packs/day: 0.25    Types: Cigarettes   Smokeless tobacco: Never   Tobacco comments:    4 cigs per day and working to cut back.  Vaping Use   Vaping status: Never Used  Substance and Sexual Activity   Alcohol use: Not Currently   Drug use: Not Currently    Types: Methamphetamines, Other-see comments, Marijuana, Crack cocaine    Comment: suboxone  daily   Sexual activity: Not Currently    Birth control/protection: None  Other Topics Concern   Not on file  Social History Narrative   Not on file   Social Drivers of Health   Financial Resource Strain: Not on file  Food Insecurity: No Food Insecurity (12/27/2021)   Hunger Vital Sign    Worried About Running Out of Food in the Last Year: Never true    Ran Out of Food in the Last Year: Never true  Transportation Needs: No Transportation Needs (12/27/2021)   PRAPARE - Administrator, Civil Service (Medical): No    Lack of Transportation (Non-Medical): No  Recent Concern: Transportation Needs - Unmet Transportation Needs (11/29/2021)   PRAPARE - Administrator, Civil Service (Medical): Yes    Lack of  Transportation (Non-Medical): Yes  Physical Activity: Not on file  Stress: Not on file  Social Connections: Moderately Isolated (11/29/2021)   Social Connection and Isolation Panel    Frequency of Communication with Friends and Family: More than three times a week    Frequency of Social Gatherings with Friends and Family: More than three times a week    Attends Religious Services: More than 4 times per year    Active Member of [employer] or Organizations: No    Attends Engineer, structural: Never    Marital Status: Never married    Allergies:  No Known Allergies  Metabolic Disorder Labs: Lab Results  Component Value Date   HGBA1C 4.9 09/16/2021   MPG 93.93 09/16/2021   Lab Results  Component Value Date   PROLACTIN 8.2 12/25/2020   Lab Results  Component Value Date   CHOL 142 07/14/2023   TRIG 76 07/14/2023   HDL 42 07/14/2023   CHOLHDL 3.4 07/14/2023   VLDL 8 09/16/2021   LDLCALC 85 07/14/2023   LDLCALC 55 09/16/2021   Lab Results  Component Value Date   TSH 0.918 07/14/2023    Therapeutic Level Labs: Lab Results  Component Value Date   LITHIUM  0.3 (L) 12/27/2021   No results found for: CBMZ No results found for: VALPROATE  Current Medications: Current Outpatient Medications  Medication Sig Dispense Refill   Buprenorphine  HCl-Naloxone  HCl (SUBOXONE ) 4-1 MG FILM Place 4 mg of opioid under the tongue daily.     norgestimate -ethinyl estradiol  (SPRINTEC 28) 0.25-35 MG-MCG tablet Take 1 tablet by mouth daily. 30 tablet 11   buPROPion  ER (WELLBUTRIN  SR) 100 MG 12 hr tablet Take 1 tablet (100 mg total) by mouth daily. 30 tablet 1   nicotine  (NICODERM CQ  - DOSED IN MG/24 HOURS) 14 mg/24hr patch Place 1 patch (14 mg total) onto the skin daily. (Patient not taking: Reported on 10/06/2023) 28 patch 0   OLANZapine  zydis (ZYPREXA ) 15 MG disintegrating tablet Take 1 tablet (15 mg total) by mouth at bedtime. 30 tablet 1   No current facility-administered medications  for this visit.    Psychiatric Specialty Exam: Review of Systems  Cardiovascular:  Negative for chest pain.  Neurological:  Negative for tremors.  Psychiatric/Behavioral:  Negative for sleep disturbance.     Blood pressure 110/72, pulse 85, temperature 98.5 F (36.9 C), temperature source Temporal, height 5' 6.75 (1.695 m), weight 168 lb (76.2 kg), SpO2 98%.Body mass index is 26.51 kg/m.  General Appearance: Casual  Eye Contact:  Fair  Speech:  Clear and Coherent  Volume:  Normal  Mood: Euthymic  Affect:  Constricted  Thought Process:  Goal Directed  Orientation:  Full (Time, Place, and Person)  Thought Content:  Rumination  Suicidal Thoughts:  No  Homicidal Thoughts:  No  Memory:  Immediate;   Fair  Judgement:  Fair  Insight:  Shallow  Psychomotor Activity:  Normal  Concentration:  Concentration: Fair  Recall:  Fair  Fund of Knowledge:Good  Language: Good  Akathisia:  No  Handed:    AIMS (if indicated):  no involuntary movements  Assets:  Desire for Improvement Social Support  ADL's:  Intact  Cognition: WNL  Sleep:  Poor   Screenings: AIMS    Flowsheet Row Admission (Discharged) from 01/19/2020 in [facility]PATIENT BE[facility]AVIORAL MEDICINE Admission (Discharged) from 11/23/2019 in [facility]PATIENT BE[facility]AVIORAL MEDICINE Admission (Discharged) from 10/15/2019 in [facility]PATIENT BEHAVIORAL MEDICINE  AIMS Total Score 0 0 0   AUDIT    Flowsheet Row Admission (Discharged) from 01/19/2020 in [facility]AL MEDICINE Admission (Discharged) from 11/23/2019 in [facility]PATIENT BEHAVIORAL MEDICINE Admission (Discharged) from 10/15/2019 in [facility] INPATIENT BEHAVIORAL MEDICINE  Alcohol Use Disorder Identification Test Final Score (AUDIT) 27 25 3    GAD-7    Flowsheet Row Office Visit from 10/06/2023 in [facility] at [facility] Office Visit from 07/20/2023 in [facility] Of [place]. [facility] Counselor from  03/16/2023 in [facility] at [facility] Office Visit from 11/13/2022 in [facility] Health at [facility] Office Visit from 04/02/2022 in [facility] - [facility] Of [place]. [facility]  Total GAD-7 Score 4 11 8 20 10    PHQ2-9    Flowsheet Row Office Visit from 10/06/2023 in [facility] at [facility] Office Visit from 07/20/2023 in [facility] - [facility] Dept Of [place]. [facility] Counselor from 03/16/2023 in [facility] at [facility] Counselor from [facility]/20/20[facility] at [facility] Office Visit from 11/13/2022 in [facility] at [facility]  PHQ-2 Total Score 2 6 4 1 3   PHQ-9 Total Score 5 13 9  -- 20   Flowsheet Row Office Visit from 10/06/2023 in [facility] at [facility] Office Visit from 07/14/2023 in [facility] at [facility] Counselor from 01/20/2023 in [facility] at [facility]  C-SSRS RISK CATEGORY No Risk No Risk No Risk    Assessment and Plan: as follows  Prior documentation reviewed  Bipolar disorder per history current episode depressed; improved mood continue Wellbutrin  she is also on Zydis monitoring weight does not report having side effects of tremors  Labs reviewed continue follow-up with her primary care physician insomnia; manageable continue olanzapine  and sleep hygiene  Substance use disorder she is well-maintained on Suboxone ; does not endorse craving and is planning to lower the dose  Follow-up in 3 months or earlier if needed   Direct care time spent 20 minutes  including chart review and documentation  Collaboration of Care: Primary Care Provider AEB notes and chart/referral  reviewed  Patient/Guardian was advised Release of Information must be obtained prior to any record release in order to collaborate their care with an outside provider. Patient/Guardian was advised if they have not already done so to contact the registration department to sign all necessary forms in order for us  to release information regarding their care.   Consent: Patient/Guardian gives verbal consent for treatment and assignment of benefits for services provided during this visit. Patient/Guardian expressed understanding and agreed to  proceed.   Jackey Flight, MD 9/23/20252:46 PM

## 2023-10-07 ENCOUNTER — Encounter: Payer: Self-pay | Admitting: Student

## 2023-10-08 ENCOUNTER — Other Ambulatory Visit: Payer: Self-pay | Admitting: Student

## 2023-10-08 DIAGNOSIS — Z3009 Encounter for other general counseling and advice on contraception: Secondary | ICD-10-CM

## 2023-10-08 MED ORDER — BUPRENORPHINE HCL-NALOXONE HCL 4-1 MG SL FILM: 1.0000 | ORAL_FILM | Freq: Every day | SUBLINGUAL | 0 refills | Status: DC

## 2023-10-08 MED ORDER — NORGESTIMATE-ETH ESTRADIOL 0.25-35 MG-MCG PO TABS: 1.0000 | ORAL_TABLET | Freq: Every day | ORAL | 11 refills | Status: DC

## 2023-10-08 NOTE — Progress Notes (Signed)
 Refilled oral birth control. Refilled Suboxone  (1 film daily). Reviewed PDMP and appropriate.

## 2023-12-25 ENCOUNTER — Other Ambulatory Visit (HOSPITAL_COMMUNITY): Payer: Self-pay | Admitting: Psychiatry

## 2023-12-30 ENCOUNTER — Encounter: Payer: Self-pay | Admitting: Student

## 2023-12-30 MED ORDER — BUPRENORPHINE HCL-NALOXONE HCL 4-1 MG SL FILM: 1.0000 | ORAL_FILM | Freq: Every day | SUBLINGUAL | 0 refills | Status: DC

## 2023-12-31 ENCOUNTER — Telehealth: Payer: Self-pay

## 2023-12-31 NOTE — Telephone Encounter (Signed)
 Marcia West (Key: [id number]) PA Case ID #: [id number] Rx #: [id number] Need Help? Call us  at [phone] Outcome Approved today by [employer] 2017 Approved. BUPRENORPHINE  HCL-NALOXONE  HCL 4/1/MG Film is approved from 12/31/2023 to 12/30/2024. All strengths of the drug are approved. Effective Date: 12/31/2023 Authorization Expiration Date: 12/30/2024 Drug Buprenorphine  HCl-Naloxone  HCl 4-1MG  films ePA cloud logo Form [employer] Medicaid Electronic Prior Authorization Form Original Claim Info [id number] .. . Help Desk [phone]. Prescriberwith no effective DEA License. Provide valid DEA license evidence for claim resubmission.. For 3 day temporary supply, use Level of Service 03. Contact Prescribe

## 2023-12-31 NOTE — Telephone Encounter (Signed)
 Prior Authorization for patient (Buprenorphine  HCl-Naloxone  HCl 4-1MG  films) came through on cover my meds was submitted with last office notes and labs awaiting approval or denial.  [id number]

## 2024-01-26 ENCOUNTER — Encounter (HOSPITAL_COMMUNITY): Payer: Self-pay | Admitting: Psychiatry

## 2024-01-26 ENCOUNTER — Ambulatory Visit (HOSPITAL_COMMUNITY): Payer: MEDICAID | Admitting: Psychiatry

## 2024-01-26 VITALS — BP 112/76 | HR 71 | Temp 98.2°F | Ht 66.0 in | Wt 178.0 lb

## 2024-01-26 DIAGNOSIS — F1190 Opioid use, unspecified, uncomplicated: Secondary | ICD-10-CM | POA: Diagnosis not present

## 2024-01-26 DIAGNOSIS — G4700 Insomnia, unspecified: Secondary | ICD-10-CM | POA: Diagnosis not present

## 2024-01-26 DIAGNOSIS — F319 Bipolar disorder, unspecified: Secondary | ICD-10-CM

## 2024-01-26 DIAGNOSIS — F411 Generalized anxiety disorder: Secondary | ICD-10-CM | POA: Diagnosis not present

## 2024-01-26 DIAGNOSIS — F5102 Adjustment insomnia: Secondary | ICD-10-CM

## 2024-01-26 MED ORDER — OLANZAPINE 15 MG PO TBDP: 15.0000 mg | ORAL_TABLET | Freq: Every day | ORAL | 1 refills | Status: AC

## 2024-01-26 MED ORDER — BUPROPION HCL ER (SR) 100 MG PO TB12: 100.0000 mg | ORAL_TABLET | Freq: Every day | ORAL | 1 refills | Status: AC

## 2024-01-26 NOTE — Progress Notes (Signed)
 " [facility] Follow up visit   Patient Identification: Marcia West MRN:  [id number] Date of Evaluation:  01/26/2024 Referral Source: primary care Chief Complaint:   No chief complaint on file.  Visit Diagnosis:    ICD-10-CM   1. Bipolar affective disorder, remission status unspecified (HCC)  F31.9     2. Opioid use disorder  F11.90     3. GAD (generalized anxiety disorder)  F41.1     4. Adjustment insomnia  F51.02       History of Present Illness: Patient is a 39 years old currently single Caucasian female was living with her mom and her husband she currently works full-time in [employer].  She has 3 kids , youngest daugher lives with her .  Referred initially by primary care physician establish care for history of bipolar disorder insomnia and drug use She wanted to change provider and has been following with [facility] but not feel that she was being heard and there was some non compliance    Patient gives a complicated a long history of having episodes of depression and also of possible mania with episodes of depression  On evaluation patient is doing better she has got a job promotion and mood is doing better as well she is cutting her Suboxone  down and will discontinue next 1 to 2 weeks after having a meeting with the Suboxone  clinic  Not endorse having craving for marijuana or opioids  She does have some sleep disturbance but apparently goes to bed early and then has to be in the bed for a couple of hours before she falls asleep so we talked about sleep hygiene Zydus does help her mood there is no reported side effects she is following with a primary care physician regarding her labs and has been discussed States have stopped using THC  Aggravating factors; past relationship concerns, difficult childhood and also substance use  Modifying factors; her current living situation with her mom, walking, children  Duration since age 58    Severity improved    Past Psychiatric  History: bipolar, gad, opioid use disorder  Previous Psychotropic Medications: Yes  Lithium  but doesn't want to do blood work Substance Abuse History in the last 12 months:  Yes.    Consequences of Substance Abuse: On suboxone  for maintenance   Past Medical History:  Past Medical History:  Diagnosis Date   Anxiety    Bipolar affective (HCC)    Depression    Herpes simplex type II infection    Substance abuse (HCC)     Past Surgical History:  Procedure Laterality Date   NO PAST SURGERIES     WISDOM TOOTH EXTRACTION      Family Psychiatric History: not know  Family History:  Family History  Problem Relation Age of Onset   Arthritis Maternal Grandmother    Diabetes Maternal Grandmother    Diabetes Maternal Grandfather    Obesity Paternal Grandmother     Social History:   Social History   Socioeconomic History   Marital status: Single    Spouse name: Not on file   Number of children: Not on file   Years of education: Not on file   Highest education level: Not on file  Occupational History   Not on file  Tobacco Use   Smoking status: Every Day    Current packs/day: 0.25    Types: Cigarettes   Smokeless tobacco: Never   Tobacco comments:    4 cigs per day and working to cut  back.  Vaping Use   Vaping status: Never Used  Substance and Sexual Activity   Alcohol use: Not Currently   Drug use: Not Currently    Types: Methamphetamines, Other-see comments, Marijuana, Crack cocaine    Comment: suboxone  daily   Sexual activity: Not Currently    Birth control/protection: None  Other Topics Concern   Not on file  Social History Narrative   Not on file   Social Drivers of Health   Tobacco Use: High Risk (01/26/2024)   Patient History    Smoking Tobacco Use: Every Day    Smokeless Tobacco Use: Never    Passive Exposure: Not on file  Financial Resource Strain: Not on file  Food Insecurity: No Food Insecurity (12/27/2021)   Hunger Vital Sign    Worried About  Running Out of Food in the Last Year: Never true    Ran Out of Food in the Last Year: Never true  Transportation Needs: No Transportation Needs (12/27/2021)   PRAPARE - Administrator, Civil Service (Medical): No    Lack of Transportation (Non-Medical): No  Recent Concern: Transportation Needs - Unmet Transportation Needs (11/29/2021)   PRAPARE - Administrator, Civil Service (Medical): Yes    Lack of Transportation (Non-Medical): Yes  Physical Activity: Not on file  Stress: Not on file  Social Connections: Moderately Isolated (11/29/2021)   Social Connection and Isolation Panel    Frequency of Communication with Friends and Family: More than three times a week    Frequency of Social Gatherings with Friends and Family: More than three times a week    Attends Religious Services: More than 4 times per year    Active Member of Clubs or Organizations: No    Attends Banker Meetings: Never    Marital Status: Never married  Depression (PHQ2-9): Medium Risk (01/26/2024)   Depression (PHQ2-9)    PHQ-2 Score: 10  Alcohol Screen: Not on file  Housing: Low Risk (12/27/2021)   Housing    Last Housing Risk Score: 0  Utilities: Not At Risk (12/27/2021)   AHC Utilities    Threatened with loss of utilities: No  Health Literacy: Not on file    Allergies:  No Known Allergies  Metabolic Disorder Labs: Lab Results  Component Value Date   HGBA1C 4.9 09/16/2021   MPG 93.93 09/16/2021   Lab Results  Component Value Date   PROLACTIN 8.2 12/25/2020   Lab Results  Component Value Date   CHOL 142 07/14/2023   TRIG 76 07/14/2023   HDL 42 07/14/2023   CHOLHDL 3.4 07/14/2023   VLDL 8 09/16/2021   LDLCALC 85 07/14/2023   LDLCALC 55 09/16/2021   Lab Results  Component Value Date   TSH 0.918 07/14/2023    Therapeutic Level Labs: Lab Results  Component Value Date   LITHIUM  0.3 (L) 12/27/2021   No results found for: CBMZ No results found for:  VALPROATE  Current Medications: Current Outpatient Medications  Medication Sig Dispense Refill   Buprenorphine  HCl-Naloxone  HCl (SUBOXONE ) 4-1 MG FILM Place 1 Film under the tongue daily. (Patient taking differently: Place 0.5 Film under the tongue daily.) 30 each 0   norgestimate -ethinyl estradiol  (SPRINTEC 28) 0.25-35 MG-MCG tablet Take 1 tablet by mouth daily. 30 tablet 11   buPROPion  ER (WELLBUTRIN  SR) 100 MG 12 hr tablet Take 1 tablet (100 mg total) by mouth daily. 30 tablet 1   nicotine  (NICODERM CQ  - DOSED IN MG/24 HOURS) 14 mg/24hr patch  Place 1 patch (14 mg total) onto the skin daily. (Patient not taking: Reported on 01/26/2024) 28 patch 0   OLANZapine  zydis (ZYPREXA ) 15 MG disintegrating tablet Take 1 tablet (15 mg total) by mouth at bedtime. 30 tablet 1   No current facility-administered medications for this visit.    Psychiatric Specialty Exam: Review of Systems  Cardiovascular:  Negative for chest pain.  Neurological:  Negative for tremors.  Psychiatric/Behavioral:  Negative for sleep disturbance.     Blood pressure 112/76, pulse 71, temperature 98.2 F (36.8 C), temperature source Temporal, height 5' 6 (1.676 m), weight 178 lb (80.7 kg), SpO2 99%.Body mass index is 28.73 kg/m.  General Appearance: Casual  Eye Contact:  Fair  Speech:  Clear and Coherent  Volume:  Normal  Mood: Euthymic  Affect:  Constricted  Thought Process:  Goal Directed  Orientation:  Full (Time, Place, and Person)  Thought Content:  Rumination  Suicidal Thoughts:  No  Homicidal Thoughts:  No  Memory:  Immediate;   Fair  Judgement:  Fair  Insight:  Shallow  Psychomotor Activity:  Normal  Concentration:  Concentration: Fair  Recall:  Fair  Fund of Knowledge:Good  Language: Good  Akathisia:  No  Handed:    AIMS (if indicated):  no involuntary movements  Assets:  Desire for Improvement Social Support  ADL's:  Intact  Cognition: WNL  Sleep:  Poor   Screenings: AIMS    Flowsheet Row  Admission (Discharged) from 01/19/2020 in [facility] INPATIENT BEHAVIORAL MEDICINE Admission (Discharged) from 11/23/2019 in [facility] INPATIENT BEHAVIORAL MEDICINE Admission (Discharged) from 10/15/2019 in [facility]PATIENT BE[facility]AV[facility]AL MEDICINE  AIMS Total Score 0 0 0   AUDIT    Flowsheet Row Admission (Discharged) from 01/19/2020 in [facility] INPATIENT BE[facility]AVIORAL MEDICINE Admission (Discharged) from 11/23/2019 in [facility] INPATIENT BEHAV[facility]AL MEDICINE Admission (Discharged) from 10/15/2019 in [facility] INPATIENT BE[facility]AVIORAL MEDICINE  Alcohol Use Disorder Identification Test Final Score (AUDIT) 27 25 3    GAD-7    Flowsheet Row Office Visit from 01/26/2024 in [facility] at [facility] Office Visit from 10/06/2023 in [facility] Out[facility] at [facility] Office Visit from 07/20/2023 in [facility] Med C[facility] - A Dept Of [place]. [facility] Counselor from 03/16/2023 in [facility] at [facility] Office Visit from 11/13/2022 in [facility] Outpatient Behavioral Health at [facility]  Total GAD-7 Score 7 4 11 8 20    PHQ2-9    Flowsheet Row Office Visit from 01/26/2024 in [facility] at [facility] Office Visit from 10/06/2023 in [facility] Outpatient Behavior[facility] at [facility] Office Visit from 07/20/2023 in [facility] - A Dept Of [place]. [facility] Counselor from 03/16/2023 in [facility] Outpatient Behavioral Health at [facility] Counselor from 01/20/2023 in [facility] Outpatient Behavioral Health at [facility]  PHQ-2 Total Score 2 2 6 4 1   PHQ-9 Total Score 10 5 13 9  --   Flowsheet Row Office Visit from 01/26/2024 in [facility] Outpatient [facility] at [facility] Office Visit from 10/06/2023 in [facility] at [facility] Office Visit from  07/14/2023 in [facility] at [facility]  C-SSRS RISK CATEGORY No Risk Error: Question 2 not populated No Risk    Assessment and Plan: as follows Prior documentation reviewed  Bipolar disorder per history current episode depressed; improved continue Wellbutrin  and Zydis at night no tremors reported was seen  labs reviewed continue follow-up with her primary care physician insomnia; manageable continue olanzapine  and sleep hygiene avoid going to bed earlier   substance use disorder well-maintained on Suboxone  and have cut down the dose she may discontinue next 1 to 2 weeks discussed relapse prevention  FU 4 - 6 m , renewed meds if due  Direct care time spent 18 minutes  including chart review and documentation  Collaboration of Care: Primary Care Provider AEB notes and chart/referral reviewed  Patient/Guardian was advised Release of Information must be obtained prior to any record release in order to collaborate their care with an outside provider. Patient/Guardian was advised if they have not already done so to contact the registration department to sign all necessary forms in order for us  to release information regarding their care.   Consent: Patient/Guardian gives verbal consent for treatment and assignment of benefits for services provided during this visit. Patient/Guardian expressed understanding and agreed to proceed.   Jackey Flight, MD 1/13/20263:17 PM  "

## 2024-01-27 ENCOUNTER — Ambulatory Visit: Payer: Self-pay

## 2024-01-28 ENCOUNTER — Ambulatory Visit: Payer: MEDICAID | Admitting: Student

## 2024-01-28 ENCOUNTER — Encounter: Payer: Self-pay | Admitting: Student

## 2024-01-28 ENCOUNTER — Other Ambulatory Visit: Payer: Self-pay

## 2024-01-28 VITALS — BP 119/70 | HR 75 | Temp 98.3°F | Ht 66.0 in | Wt 179.2 lb

## 2024-01-28 DIAGNOSIS — F319 Bipolar disorder, unspecified: Secondary | ICD-10-CM | POA: Diagnosis not present

## 2024-01-28 DIAGNOSIS — F17200 Nicotine dependence, unspecified, uncomplicated: Secondary | ICD-10-CM

## 2024-01-28 DIAGNOSIS — F1190 Opioid use, unspecified, uncomplicated: Secondary | ICD-10-CM

## 2024-01-28 DIAGNOSIS — Z3009 Encounter for other general counseling and advice on contraception: Secondary | ICD-10-CM

## 2024-01-28 DIAGNOSIS — F17210 Nicotine dependence, cigarettes, uncomplicated: Secondary | ICD-10-CM | POA: Diagnosis not present

## 2024-01-28 DIAGNOSIS — Z3041 Encounter for surveillance of contraceptive pills: Secondary | ICD-10-CM | POA: Diagnosis not present

## 2024-01-28 MED ORDER — BUPRENORPHINE HCL-NALOXONE HCL 2-0.5 MG SL FILM: 1.0000 | ORAL_FILM | Freq: Every day | SUBLINGUAL | 0 refills | Status: DC

## 2024-01-28 MED ORDER — NORGESTIMATE-ETH ESTRADIOL 0.25-35 MG-MCG PO TABS: 1.0000 | ORAL_TABLET | Freq: Every day | ORAL | 4 refills | Status: AC

## 2024-01-28 NOTE — Progress Notes (Signed)
 Internal Medicine Clinic Attending  Case discussed with the resident at the time of the visit.  We reviewed the resident's history and exam and pertinent patient test results.  I agree with the assessment, diagnosis, and plan of care documented in the resident's note.

## 2024-01-28 NOTE — Assessment & Plan Note (Signed)
 Follows behavioral health, last visit on 01/26/2024; currently on Wellbutrin  and Olanzapine  Zydis at night. Well controlled

## 2024-01-28 NOTE — Patient Instructions (Signed)
 Thank you, Ms.Der Heady for allowing us  to provide your care today. Today we discussed:  I sent a 30 day supply of Suboxone  to the pharmacy   I will update you on the urine test   I have ordered the following labs for you:   Lab Orders         ToxAssure Select,+Antidepr,UR       Referrals ordered today:   Referral Orders  No referral(s) requested today     I have ordered the following medication/changed the following medications:   Stop the following medications: Medications Discontinued During This Encounter  Medication Reason   Buprenorphine  HCl-Naloxone  HCl (SUBOXONE ) 4-1 MG FILM Change in therapy   norgestimate -ethinyl estradiol  (SPRINTEC 28) 0.25-35 MG-MCG tablet Reorder     Start the following medications: Meds ordered this encounter  Medications   norgestimate -ethinyl estradiol  (SPRINTEC 28) 0.25-35 MG-MCG tablet    Sig: Take 1 tablet by mouth daily.    Dispense:  90 tablet    Refill:  4   Buprenorphine  HCl-Naloxone  HCl 2-0.5 MG FILM    Sig: Place 1 Film under the tongue daily.    Dispense:  30 each    Refill:  0    Refill 1/3     Follow up: 3 months OUD    Remember:   Should you have any questions or concerns please call the internal medicine clinic at [phone].     Dr. Toma [facility]

## 2024-01-28 NOTE — Assessment & Plan Note (Signed)
 Refill is sent to the pharmacy. No concerns, reports adherence

## 2024-01-28 NOTE — Progress Notes (Signed)
" ° °  Established Patient Office Visit  Subjective   Patient ID: Marcia West, female    DOB: 1985/07/29  Age: 39 y.o. MRN: [id number]  Chief Complaint  Patient presents with   Follow-up    TOX    Medication Refill    Marcia West is a 39 y.o. female with past medical history of anxiety/depression, OUD, bipolar 1 disorder presents today for follow-up on OUD.  Review of Systems:  As per assessment and Plan   Objective:     Vitals:   01/28/24 1441  BP: 119/70  Pulse: 75  Temp: 98.3 F (36.8 C)  TempSrc: Oral  SpO2: 100%  Weight: 179 lb 3.2 oz (81.3 kg)  Height: 5' 6 (1.676 m)    Physical Exam General: Sitting in chair, no acute distress Cardiovascular: Regular rate Pulmonary: Breathing comfortably Abdomen: Soft, nontender, nondistended MSK: No lower extremity edema bilaterally    Assessment & Plan:   Patient discussed with Dr. Shawn  Problem List Items Addressed This Visit       Other   Opioid use disorder - Primary (Chronic)   Currently on Suboxone  4-1 mg film, each films lasts for 3 days. PDMP reviewed, last refill 10/08/2023; Denies any symptoms of cravings. Last UDS 07/20/2023 with expected findings  - Suboxone  2-0.5 mg film daily and continue to wean as appropriate - F/u UDS today        Relevant Medications   Buprenorphine  HCl-Naloxone  HCl 2-0.5 MG FILM   Other Relevant Orders   ToxAssure Select,+Antidepr,UR   Bipolar 1 disorder, depressed (HCC) (Chronic)   Follows behavioral health, last visit on 01/26/2024; currently on Wellbutrin  and Olanzapine  Zydis at night. Well controlled       Tobacco use disorder   Smokes 4 cigarettes per day, reports that she is not ready to quit yet. - Smoking cessation counseling is provided       Oral contraceptive use   Refill is sent to the pharmacy. No concerns, reports adherence       Relevant Medications   norgestimate -ethinyl estradiol  (SPRINTEC 28) 0.25-35 MG-MCG tablet   Other Visit Diagnoses        Counseling for birth control, oral contraceptives           Return in about 3 months (around 04/27/2024), or OUD.    Toma Edwards, DO "

## 2024-01-28 NOTE — Assessment & Plan Note (Signed)
 Smokes 4 cigarettes per day, reports that she is not ready to quit yet. - Smoking cessation counseling is provided

## 2024-01-28 NOTE — Assessment & Plan Note (Signed)
 Currently on Suboxone  4-1 mg film, each films lasts for 3 days. PDMP reviewed, last refill 10/08/2023; Denies any symptoms of cravings. Last UDS 07/20/2023 with expected findings  - Suboxone  2-0.5 mg film daily and continue to wean as appropriate - F/u UDS today

## 2024-02-01 ENCOUNTER — Telehealth: Payer: Self-pay | Admitting: *Deleted

## 2024-02-01 LAB — TOXASSURE SELECT,+ANTIDEPR,UR

## 2024-02-01 NOTE — Telephone Encounter (Unsigned)
 Copied from CRM [id number]. Topic: Clinical - Prescription Issue >> Feb 01, 2024  2:20 PM Alfonso ORN wrote: Reason for CRM: pt was seen last week with Heddy Barren on 01/28/24 and prescribe the Buprenorphine  HCl-Naloxone  HCl 2-0.5 MG FILM , patient pharmacy stated T[facility] Roy[facility] not authorize to write this prescription  Buprenorphine  HCl-Naloxone  HCl 2-0.5 MG FILM Would need another provider with correct DEA number   Please contact patient know the status

## 2024-02-02 ENCOUNTER — Ambulatory Visit: Payer: Self-pay | Admitting: Student

## 2024-02-02 ENCOUNTER — Other Ambulatory Visit: Payer: Self-pay | Admitting: Student

## 2024-02-02 DIAGNOSIS — F1190 Opioid use, unspecified, uncomplicated: Secondary | ICD-10-CM

## 2024-02-02 MED ORDER — BUPRENORPHINE HCL-NALOXONE HCL 2-0.5 MG SL FILM: 1.0000 | ORAL_FILM | Freq: Every day | SUBLINGUAL | 0 refills | Status: AC

## 2024-07-26 ENCOUNTER — Ambulatory Visit (HOSPITAL_COMMUNITY): Payer: MEDICAID | Admitting: Psychiatry
